# Patient Record
Sex: Female | Born: 1955 | Race: White | Hispanic: No | Marital: Married | Smoking: Never smoker
Health system: Southern US, Community
[De-identification: ages and names within clinical notes are randomized; demographics above are authoritative.]

## PROBLEM LIST (undated history)

## (undated) DIAGNOSIS — K5790 Diverticulosis of intestine, part unspecified, without perforation or abscess without bleeding: Secondary | ICD-10-CM

## (undated) DIAGNOSIS — M810 Age-related osteoporosis without current pathological fracture: Secondary | ICD-10-CM

## (undated) DIAGNOSIS — I341 Nonrheumatic mitral (valve) prolapse: Secondary | ICD-10-CM

## (undated) DIAGNOSIS — R7982 Elevated C-reactive protein (CRP): Secondary | ICD-10-CM

## (undated) DIAGNOSIS — K222 Esophageal obstruction: Secondary | ICD-10-CM

## (undated) DIAGNOSIS — K219 Gastro-esophageal reflux disease without esophagitis: Secondary | ICD-10-CM

## (undated) DIAGNOSIS — I10 Essential (primary) hypertension: Secondary | ICD-10-CM

## (undated) DIAGNOSIS — N200 Calculus of kidney: Secondary | ICD-10-CM

## (undated) DIAGNOSIS — K449 Diaphragmatic hernia without obstruction or gangrene: Secondary | ICD-10-CM

## (undated) HISTORY — DX: Essential (primary) hypertension: I10

## (undated) HISTORY — DX: Esophageal obstruction: K22.2

## (undated) HISTORY — DX: Diaphragmatic hernia without obstruction or gangrene: K44.9

## (undated) HISTORY — DX: Gastro-esophageal reflux disease without esophagitis: K21.9

## (undated) HISTORY — PX: OTHER SURGICAL HISTORY: SHX169

## (undated) HISTORY — DX: Calculus of kidney: N20.0

## (undated) HISTORY — DX: Diverticulosis of intestine, part unspecified, without perforation or abscess without bleeding: K57.90

## (undated) HISTORY — PX: FOOT SURGERY: SHX648

## (undated) HISTORY — DX: Age-related osteoporosis without current pathological fracture: M81.0

## (undated) HISTORY — DX: Nonrheumatic mitral (valve) prolapse: I34.1

## (undated) HISTORY — DX: Elevated C-reactive protein (CRP): R79.82

---

## 1997-11-27 ENCOUNTER — Emergency Department (HOSPITAL_COMMUNITY): Admission: EM | Admit: 1997-11-27 | Discharge: 1997-11-27 | Payer: Self-pay | Admitting: Emergency Medicine

## 1999-06-03 ENCOUNTER — Other Ambulatory Visit: Admission: RE | Admit: 1999-06-03 | Discharge: 1999-06-03 | Payer: Self-pay | Admitting: *Deleted

## 2000-06-02 ENCOUNTER — Other Ambulatory Visit: Admission: RE | Admit: 2000-06-02 | Discharge: 2000-06-02 | Payer: Self-pay | Admitting: *Deleted

## 2001-06-05 ENCOUNTER — Other Ambulatory Visit: Admission: RE | Admit: 2001-06-05 | Discharge: 2001-06-05 | Payer: Self-pay | Admitting: Obstetrics and Gynecology

## 2002-06-11 ENCOUNTER — Other Ambulatory Visit: Admission: RE | Admit: 2002-06-11 | Discharge: 2002-06-11 | Payer: Self-pay | Admitting: Obstetrics and Gynecology

## 2002-06-25 ENCOUNTER — Emergency Department (HOSPITAL_COMMUNITY): Admission: EM | Admit: 2002-06-25 | Discharge: 2002-06-25 | Payer: Self-pay | Admitting: Podiatry

## 2002-10-19 ENCOUNTER — Encounter: Payer: Self-pay | Admitting: Internal Medicine

## 2002-10-19 ENCOUNTER — Ambulatory Visit (HOSPITAL_COMMUNITY): Admission: RE | Admit: 2002-10-19 | Discharge: 2002-10-19 | Payer: Self-pay | Admitting: Internal Medicine

## 2003-01-31 ENCOUNTER — Encounter: Admission: RE | Admit: 2003-01-31 | Discharge: 2003-01-31 | Payer: Self-pay | Admitting: Obstetrics and Gynecology

## 2003-01-31 ENCOUNTER — Encounter: Payer: Self-pay | Admitting: Obstetrics and Gynecology

## 2003-07-31 ENCOUNTER — Other Ambulatory Visit: Admission: RE | Admit: 2003-07-31 | Discharge: 2003-07-31 | Payer: Self-pay | Admitting: Obstetrics and Gynecology

## 2004-02-14 ENCOUNTER — Encounter: Admission: RE | Admit: 2004-02-14 | Discharge: 2004-02-14 | Payer: Self-pay | Admitting: Obstetrics and Gynecology

## 2004-07-15 ENCOUNTER — Ambulatory Visit: Payer: Self-pay | Admitting: Cardiology

## 2004-07-24 ENCOUNTER — Ambulatory Visit: Payer: Self-pay | Admitting: Cardiology

## 2004-08-12 ENCOUNTER — Ambulatory Visit: Payer: Self-pay

## 2005-01-05 ENCOUNTER — Ambulatory Visit: Payer: Self-pay | Admitting: Internal Medicine

## 2005-04-06 ENCOUNTER — Encounter: Admission: RE | Admit: 2005-04-06 | Discharge: 2005-04-06 | Payer: Self-pay | Admitting: Obstetrics and Gynecology

## 2006-02-02 ENCOUNTER — Ambulatory Visit: Payer: Self-pay | Admitting: Internal Medicine

## 2006-04-11 ENCOUNTER — Ambulatory Visit: Payer: Self-pay | Admitting: Internal Medicine

## 2006-05-03 ENCOUNTER — Encounter: Admission: RE | Admit: 2006-05-03 | Discharge: 2006-05-03 | Payer: Self-pay | Admitting: Obstetrics and Gynecology

## 2006-05-18 ENCOUNTER — Encounter: Admission: RE | Admit: 2006-05-18 | Discharge: 2006-05-18 | Payer: Self-pay | Admitting: Obstetrics and Gynecology

## 2006-09-21 ENCOUNTER — Ambulatory Visit: Payer: Self-pay | Admitting: Cardiology

## 2006-09-22 ENCOUNTER — Encounter: Payer: Self-pay | Admitting: Internal Medicine

## 2006-09-22 ENCOUNTER — Ambulatory Visit: Payer: Self-pay

## 2006-11-17 ENCOUNTER — Encounter (INDEPENDENT_AMBULATORY_CARE_PROVIDER_SITE_OTHER): Payer: Self-pay | Admitting: Diagnostic Radiology

## 2006-11-17 ENCOUNTER — Encounter: Admission: RE | Admit: 2006-11-17 | Discharge: 2006-11-17 | Payer: Self-pay | Admitting: Obstetrics and Gynecology

## 2007-03-06 ENCOUNTER — Ambulatory Visit: Payer: Self-pay | Admitting: Internal Medicine

## 2007-05-17 ENCOUNTER — Encounter: Admission: RE | Admit: 2007-05-17 | Discharge: 2007-05-17 | Payer: Self-pay | Admitting: Obstetrics and Gynecology

## 2007-05-30 ENCOUNTER — Telehealth (INDEPENDENT_AMBULATORY_CARE_PROVIDER_SITE_OTHER): Payer: Self-pay | Admitting: *Deleted

## 2007-05-31 ENCOUNTER — Encounter: Payer: Self-pay | Admitting: Internal Medicine

## 2007-05-31 DIAGNOSIS — I341 Nonrheumatic mitral (valve) prolapse: Secondary | ICD-10-CM | POA: Insufficient documentation

## 2007-05-31 DIAGNOSIS — K219 Gastro-esophageal reflux disease without esophagitis: Secondary | ICD-10-CM | POA: Insufficient documentation

## 2007-05-31 DIAGNOSIS — Z87442 Personal history of urinary calculi: Secondary | ICD-10-CM | POA: Insufficient documentation

## 2007-05-31 DIAGNOSIS — K449 Diaphragmatic hernia without obstruction or gangrene: Secondary | ICD-10-CM | POA: Insufficient documentation

## 2007-05-31 DIAGNOSIS — I10 Essential (primary) hypertension: Secondary | ICD-10-CM | POA: Insufficient documentation

## 2007-05-31 DIAGNOSIS — J4530 Mild persistent asthma, uncomplicated: Secondary | ICD-10-CM | POA: Insufficient documentation

## 2007-06-27 ENCOUNTER — Telehealth: Payer: Self-pay | Admitting: Internal Medicine

## 2007-10-12 DIAGNOSIS — K222 Esophageal obstruction: Secondary | ICD-10-CM | POA: Insufficient documentation

## 2007-10-12 DIAGNOSIS — R011 Cardiac murmur, unspecified: Secondary | ICD-10-CM | POA: Insufficient documentation

## 2007-10-23 ENCOUNTER — Ambulatory Visit: Payer: Self-pay | Admitting: Internal Medicine

## 2007-11-07 ENCOUNTER — Encounter: Payer: Self-pay | Admitting: Internal Medicine

## 2007-11-09 ENCOUNTER — Encounter: Payer: Self-pay | Admitting: Internal Medicine

## 2007-11-27 ENCOUNTER — Ambulatory Visit: Payer: Self-pay | Admitting: Internal Medicine

## 2007-12-19 ENCOUNTER — Ambulatory Visit: Payer: Self-pay | Admitting: Internal Medicine

## 2008-01-02 ENCOUNTER — Telehealth: Payer: Self-pay | Admitting: Internal Medicine

## 2008-01-17 ENCOUNTER — Encounter: Payer: Self-pay | Admitting: Internal Medicine

## 2008-01-23 ENCOUNTER — Encounter: Admission: RE | Admit: 2008-01-23 | Discharge: 2008-01-23 | Payer: Self-pay | Admitting: Otolaryngology

## 2008-01-23 ENCOUNTER — Encounter: Payer: Self-pay | Admitting: Internal Medicine

## 2008-02-02 ENCOUNTER — Telehealth: Payer: Self-pay | Admitting: Internal Medicine

## 2008-02-15 ENCOUNTER — Encounter: Payer: Self-pay | Admitting: Internal Medicine

## 2008-05-21 ENCOUNTER — Encounter: Admission: RE | Admit: 2008-05-21 | Discharge: 2008-05-21 | Payer: Self-pay | Admitting: Obstetrics and Gynecology

## 2008-08-05 ENCOUNTER — Ambulatory Visit: Payer: Self-pay | Admitting: Internal Medicine

## 2008-08-05 ENCOUNTER — Encounter: Payer: Self-pay | Admitting: Adult Health

## 2008-08-05 DIAGNOSIS — M949 Disorder of cartilage, unspecified: Secondary | ICD-10-CM

## 2008-08-05 DIAGNOSIS — M899 Disorder of bone, unspecified: Secondary | ICD-10-CM | POA: Insufficient documentation

## 2008-10-02 ENCOUNTER — Ambulatory Visit: Payer: Self-pay | Admitting: Internal Medicine

## 2008-10-02 ENCOUNTER — Encounter: Payer: Self-pay | Admitting: Internal Medicine

## 2008-10-02 DIAGNOSIS — I498 Other specified cardiac arrhythmias: Secondary | ICD-10-CM | POA: Insufficient documentation

## 2008-10-21 ENCOUNTER — Encounter: Payer: Self-pay | Admitting: Internal Medicine

## 2008-10-21 ENCOUNTER — Ambulatory Visit: Payer: Self-pay

## 2008-10-21 ENCOUNTER — Ambulatory Visit: Payer: Self-pay | Admitting: Internal Medicine

## 2008-10-24 ENCOUNTER — Ambulatory Visit: Payer: Self-pay | Admitting: Internal Medicine

## 2008-10-24 DIAGNOSIS — E7800 Pure hypercholesterolemia, unspecified: Secondary | ICD-10-CM | POA: Insufficient documentation

## 2008-10-24 LAB — CONVERTED CEMR LAB
ALT: 14 units/L (ref 0–35)
AST: 19 units/L (ref 0–37)
Albumin: 3.6 g/dL (ref 3.5–5.2)
Alkaline Phosphatase: 54 units/L (ref 39–117)
BUN: 18 mg/dL (ref 6–23)
Bilirubin, Direct: 0.1 mg/dL (ref 0.0–0.3)
CO2: 31 meq/L (ref 19–32)
Calcium: 9 mg/dL (ref 8.4–10.5)
Chloride: 107 meq/L (ref 96–112)
Cholesterol: 200 mg/dL (ref 0–200)
Creatinine, Ser: 0.8 mg/dL (ref 0.4–1.2)
GFR calc non Af Amer: 80.3 mL/min (ref >60)
Glucose, Bld: 79 mg/dL (ref 70–99)
HDL: 75 mg/dL (ref >39.00)
LDL Cholesterol: 117 mg/dL — ABNORMAL HIGH (ref 0–99)
Potassium: 3.8 meq/L (ref 3.5–5.1)
Sodium: 142 meq/L (ref 135–145)
Total Bilirubin: 0.5 mg/dL (ref 0.3–1.2)
Total CHOL/HDL Ratio: 3
Total Protein: 6.4 g/dL (ref 6.0–8.3)
Triglycerides: 38 mg/dL (ref 0.0–149.0)
VLDL: 7.6 mg/dL (ref 0.0–40.0)

## 2008-10-28 LAB — CONVERTED CEMR LAB: CRP, High Sensitivity: 3 (ref 0.00–5.00)

## 2008-11-14 ENCOUNTER — Ambulatory Visit: Payer: Self-pay | Admitting: Internal Medicine

## 2008-12-02 IMAGING — US US SOFT TISSUE HEAD/NECK
1 series · 14 of 25 positions shown · non-contrast
Comparison: None

CLINICAL DATA: Elevated TSH.  Question enlarged thyroid on physical
exam.

THYROID ULTRASOUND
TECHNIQUE: Ultrasound examination of the thyroid gland and
adjacent soft tissues was performed.

[Series 1: us soft tissue head/neck · 0.04mm/px · 14 of 32 slices shown]
[im 1/32]
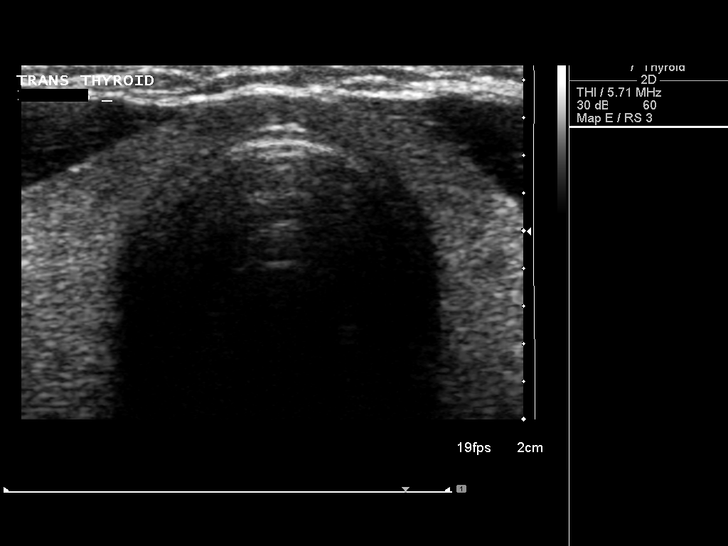
[im 3/32]
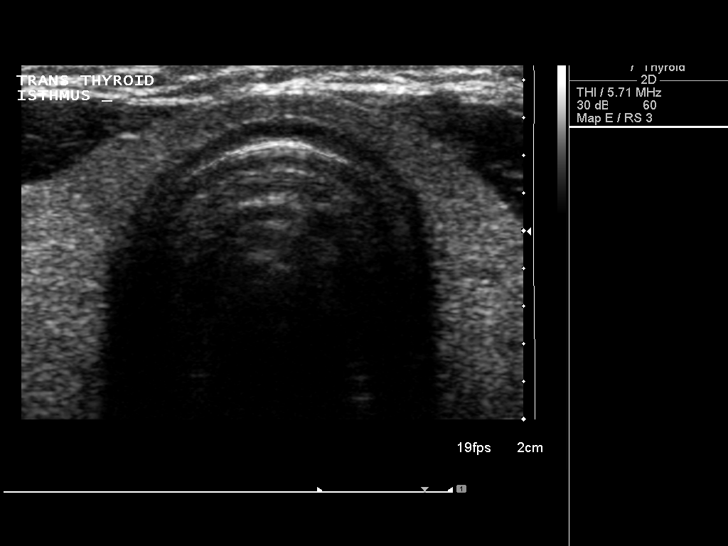
[im 6/32]
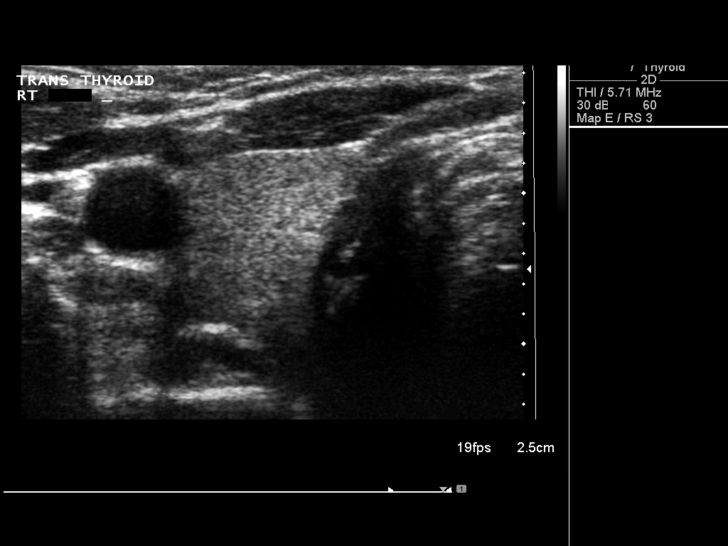
[im 8/32]
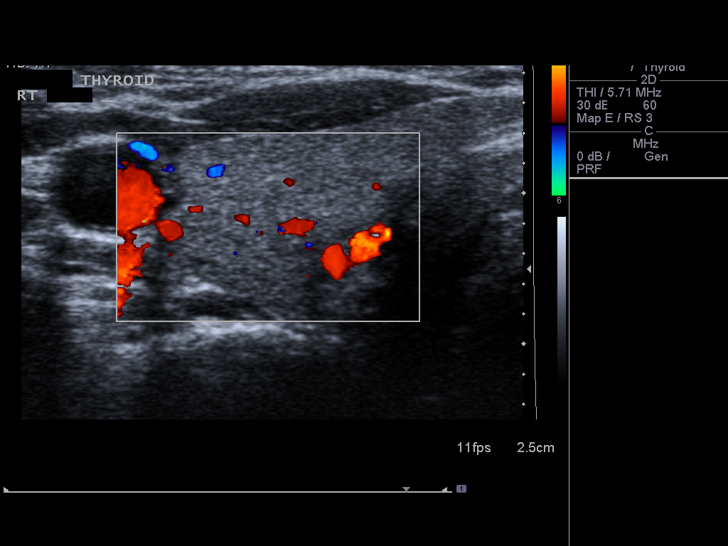
[im 11/32]
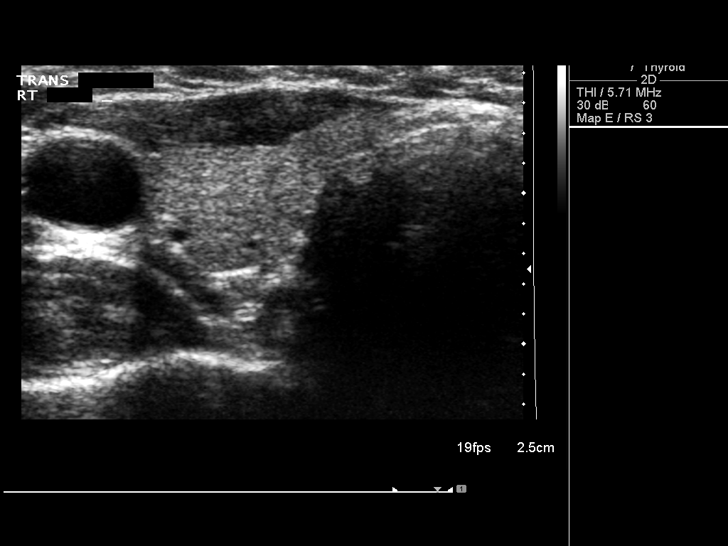
[im 12/32]
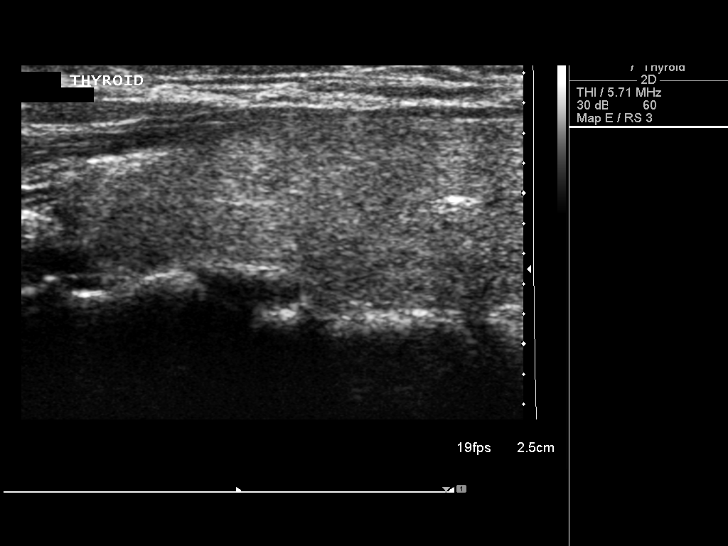
[im 15/32]
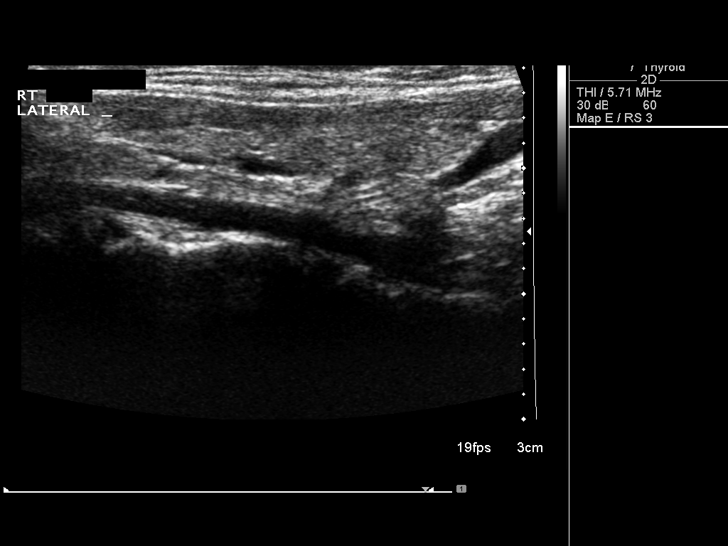
[im 17/32]
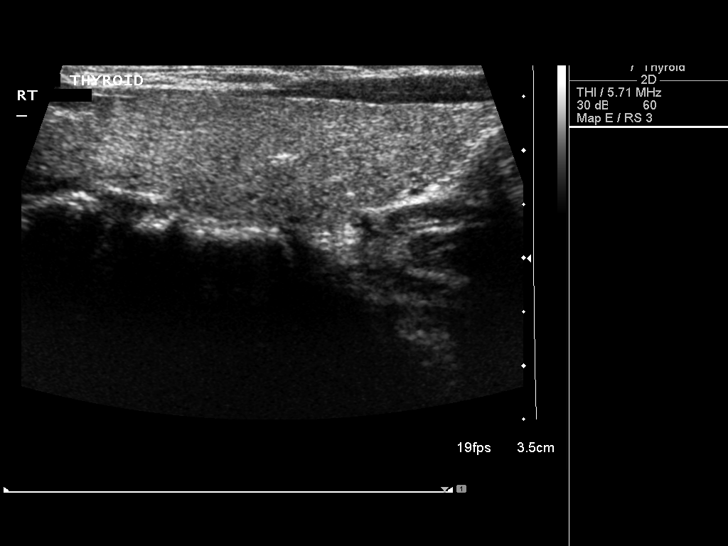
[im 20/32]
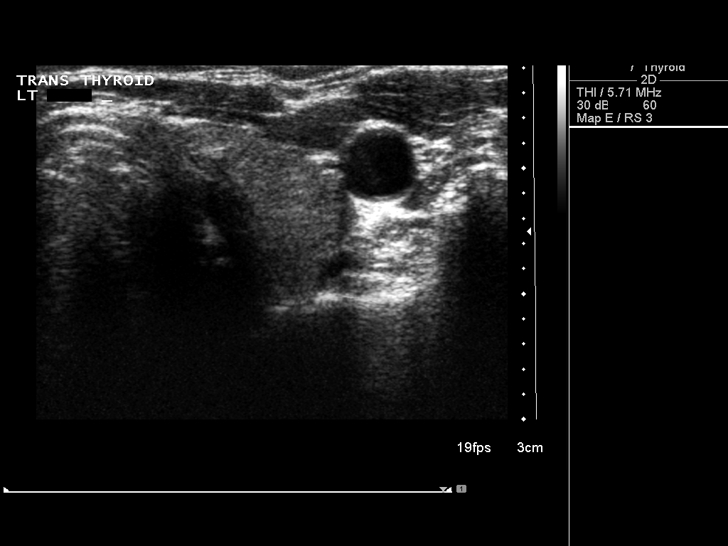
[im 21/32]
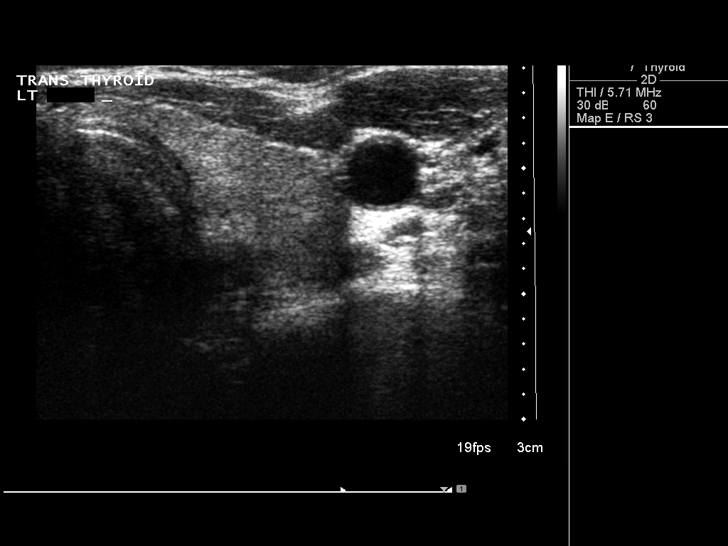
[im 24/32]
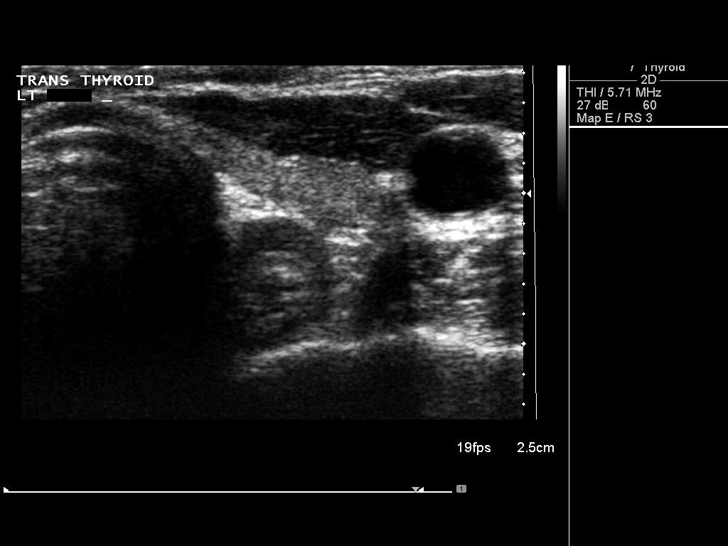
[im 26/32]
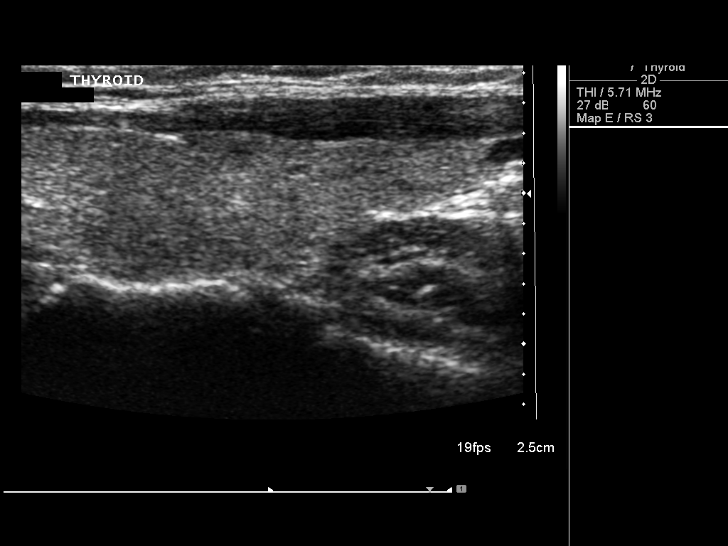
[im 29/32]
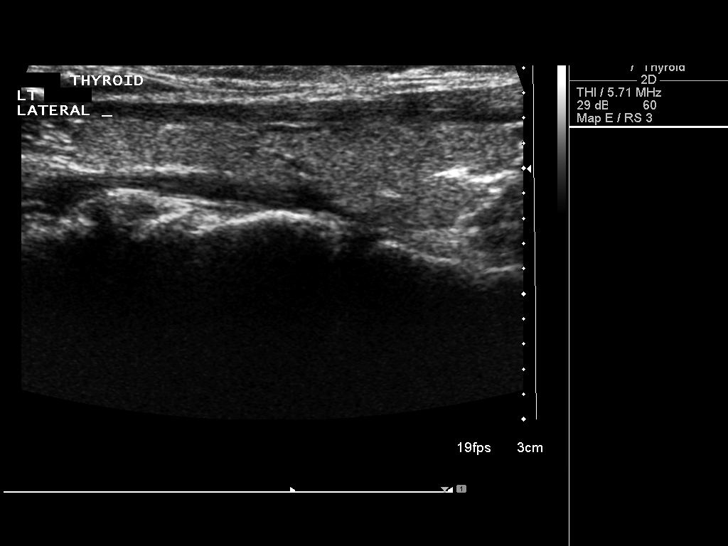
[im 32/32]
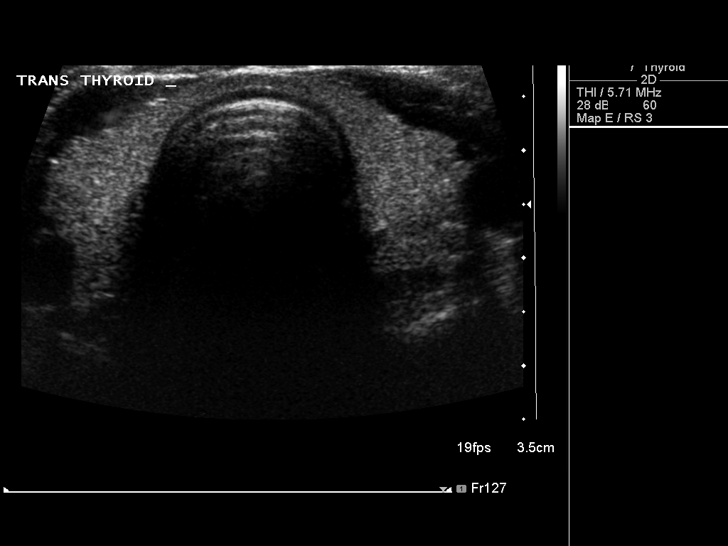

[14 of 25 positions shown; findings below may reference images not displayed]

FINDINGS: Thyroid gland is normal in size with the right lobe
measuring 4.1 cm long X 1.4 cm AP X 1.5 cm wide.  Left lobe
measures 3.7 cm long X 1.0 cm AP X 1.3 cm wide.  Isthmus measures
normally at 2 mm AP.  Thyroid echotexture is diffusely homogeneous.
No focal thyroid lesion noted.
IMPRESSION: Negative.

## 2009-01-20 ENCOUNTER — Ambulatory Visit: Payer: Self-pay | Admitting: Internal Medicine

## 2009-01-31 ENCOUNTER — Telehealth: Payer: Self-pay | Admitting: Internal Medicine

## 2009-03-03 ENCOUNTER — Ambulatory Visit: Payer: Self-pay | Admitting: Internal Medicine

## 2009-03-04 ENCOUNTER — Encounter: Payer: Self-pay | Admitting: Internal Medicine

## 2009-03-11 ENCOUNTER — Telehealth: Payer: Self-pay | Admitting: Internal Medicine

## 2009-03-20 ENCOUNTER — Encounter (INDEPENDENT_AMBULATORY_CARE_PROVIDER_SITE_OTHER): Payer: Self-pay | Admitting: *Deleted

## 2009-03-24 ENCOUNTER — Encounter: Payer: Self-pay | Admitting: Internal Medicine

## 2009-04-14 ENCOUNTER — Telehealth: Payer: Self-pay | Admitting: Internal Medicine

## 2009-05-29 ENCOUNTER — Encounter: Admission: RE | Admit: 2009-05-29 | Discharge: 2009-05-29 | Payer: Self-pay | Admitting: Obstetrics and Gynecology

## 2009-07-04 ENCOUNTER — Encounter: Payer: Self-pay | Admitting: Internal Medicine

## 2009-07-17 ENCOUNTER — Encounter: Admission: RE | Admit: 2009-07-17 | Discharge: 2009-07-17 | Payer: Self-pay | Admitting: Orthopedic Surgery

## 2009-07-22 ENCOUNTER — Telehealth (INDEPENDENT_AMBULATORY_CARE_PROVIDER_SITE_OTHER): Payer: Self-pay | Admitting: *Deleted

## 2009-12-25 ENCOUNTER — Telehealth: Payer: Self-pay | Admitting: Internal Medicine

## 2010-02-10 ENCOUNTER — Ambulatory Visit: Payer: Self-pay | Admitting: Internal Medicine

## 2010-02-18 ENCOUNTER — Ambulatory Visit: Payer: Self-pay | Admitting: Internal Medicine

## 2010-02-18 LAB — CONVERTED CEMR LAB
ALT: 15 units/L (ref 0–35)
AST: 20 units/L (ref 0–37)
Albumin: 4.1 g/dL (ref 3.5–5.2)
Alkaline Phosphatase: 55 units/L (ref 39–117)
BUN: 18 mg/dL (ref 6–23)
Bilirubin, Direct: 0.1 mg/dL (ref 0.0–0.3)
CO2: 27 meq/L (ref 19–32)
Calcium: 9.9 mg/dL (ref 8.4–10.5)
Chloride: 105 meq/L (ref 96–112)
Cholesterol: 213 mg/dL — ABNORMAL HIGH (ref 0–200)
Creatinine, Ser: 0.9 mg/dL (ref 0.4–1.2)
Direct LDL: 120.7 mg/dL
GFR calc non Af Amer: 71.57 mL/min (ref >60)
Glucose, Bld: 87 mg/dL (ref 70–99)
HDL: 85 mg/dL (ref >39.00)
Potassium: 5.1 meq/L (ref 3.5–5.1)
Sodium: 141 meq/L (ref 135–145)
Total Bilirubin: 0.6 mg/dL (ref 0.3–1.2)
Total CHOL/HDL Ratio: 3
Total Protein: 6.4 g/dL (ref 6.0–8.3)
Triglycerides: 43 mg/dL (ref 0.0–149.0)
VLDL: 8.6 mg/dL (ref 0.0–40.0)

## 2010-06-18 ENCOUNTER — Encounter
Admission: RE | Admit: 2010-06-18 | Discharge: 2010-06-18 | Payer: Self-pay | Source: Home / Self Care | Attending: Obstetrics and Gynecology | Admitting: Obstetrics and Gynecology

## 2010-07-05 ENCOUNTER — Encounter: Payer: Self-pay | Admitting: Obstetrics and Gynecology

## 2010-07-16 NOTE — Progress Notes (Signed)
Summary: calling regarding labs   Phone Note Call from Patient Call back at [phone]   Caller: Patient Summary of Call: Pt calling regarding having labs drawn. Initial call taken by: Judie Grieve,  December 25, 2009 9:16 AM  Follow-up for Phone Call        spoke w/pt labs sch for 8/30 Meredith Staggers, RN  December 25, 2009 10:48 AM

## 2010-07-16 NOTE — Progress Notes (Signed)
Summary: speak to nurse re PPI   Phone Note Call from Patient Call back at [phone]   Caller: Patient Call For: Marina Goodell Reason for Call: Talk to Nurse Summary of Call: Patient wants to know if she can get off a medication Dr Marina Goodell has her on. (Protonix) Initial call taken by: Tawni Levy,  December 25, 2009 9:11 AM  Follow-up for Phone Call        Pt.had fx. elbow after a fall in Feb and recent stress in leg.Dr.Whitfield is concerned about her bone integrity. Also takes steroid inhalers for asthma.Asking your opinion about disc. Protonix and just follow with endoscopy. Follow-up by: Teryl Lucy RN,  December 25, 2009 10:16 AM  Additional Follow-up for Phone Call Additional follow up Details #1::        Sorry to hear about the elbow. Hope that she's doing better.She will need to be on something for GERD and hx of esophageal stricture. Given her concerns over PPI's (we discussed this previously in the office), we can try H2RA's. Let's start with Ranitidine 150mg  two times a day. If that does not control reflux symptoms, we could increase to 300mg  two times a day. I'd be happy to call her if she would like to discuss further. Additional Follow-up by: Hilarie Fredrickson MD,  December 25, 2009 11:12 AM    Additional Follow-up for Phone Call Additional follow up Details #2::    message left to call back.  Teryl Lucy RN  December 25, 2009 11:29 AM Pt. delighted to know she can start Ranitidine.She will call back if it does not work.  Follow-up by: Teryl Lucy RN,  December 25, 2009 3:10 PM

## 2010-07-16 NOTE — Assessment & Plan Note (Signed)
Summary: f1y   Visit Type:  Follow-up Referring Provider:  n/a Primary Provider:  Louanna Raw, MD   CC:  no complaints.  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 She returns for f/u.   She remains very active without any symtpoms. Exercising 5-6 days per week. Echo 2010  showed normal LV function with MVP and mild MR. No palpitations or dyspnea. recently brok elbow so was off her exercise regimen for a while. gained about 10 pounds. Dieting with Atkins and now Weight Watchers.  Recent lipids TC 213 HDL 85 LDL 121. (previous TC 200/TG 38/HDL 75/LDL 117, HS-CRP was elevated at 3.00.)     Current Medications (verified): 1)  Prometrium 100 Mg Caps (Progesterone Micronized) .... Take 1 Capsule By Mouth Once A Day 2)  Ventolin Hfa 108 (90 Base) Mcg/act Aers (Albuterol Sulfate) .... 2 Puffs Every 4-6 Hr As Needed 3)  Evan Mist .... 2 Sprays Daily 4)  Fish Oil   Oil (Fish Oil) .[name].. 1 Once Daily 5)  Qvar 80 Mcg/act  Aers (Beclomethasone Dipropionate) .... Two  Puffs Twice Daily 6)  Zantac 150 Maximum Strength 150 Mg Tabs (Ranitidine Hcl) .... One Tablet By Mouth At Bedtime  Allergies: No Known Drug Allergies  Family History: Family History of Breast Cancer: Family History of Heart Disease:  No family history of gastrointestinal malignancy  Maternal uncles died from sudden cardiac death. Women on that side have lived into 53s Father died MI at 52. Had h/o IPF. + tobacco  Review of Systems       As per HPI and past medical history; otherwise all systems negative.   Vital Signs:  Patient profile:   55 year old female Height:      65 inches Weight:      135 pounds BMI:     22.55 Pulse rate:   96 / minute Pulse rhythm:   regular BP sitting:   126 / 88  (right arm)  Vitals Entered By: Jacquelin Hawking, CMA (February 18, 2010 12:32 PM)  Physical Exam  General:  Thin. well appearing. no resp  difficulty HEENT: normal Neck: supple. no JVD. Carotids 2+ bilat; not bruits. No lymphadenopathy or thryomegaly appreciated. Cor: PMI nondisplaced. Regular rate & rhythm. No rubs, gallops, murmur. Lungs: clear Abdomen: soft, nontender, nondistended.  Extremities: no cyanosis, clubbing, rash,  edema Neuro: alert & orientedx3, cranial nerves grossly intact. moves all 4 extremities w/o difficulty. affect pleasant    Impression & Recommendations:  Problem # 1:  MITRAL VALVE PROLAPSE (ICD-424.0) Stable. No murmur on exam today.   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 HDL very high. Continue to monitor. Recheck in 6 months with CRP. At some point will need exercise test for routine survellance as she gets further into menopause.  Problem # 3:  HYPERTENSION (ICD-401.9) BP well controlled at gym and [employer]. Suspect she has component of white-coat hypertension. Continue to follow.   Other Orders: EKG w/ Interpretation (93000)  Patient Instructions: 1)  Your physician recommends that you return for a FASTING lipid, liver, bmet, and crp in 6 months.  We will send you a reminder letter in the mail, when you get this call our office and schedule an appointment for labs. 2)  Your physician wants you to follow-up in:  1 year.  You will receive a reminder letter in the mail two months  in advance. If you don't receive a letter, please call our office to schedule the follow-up appointment.

## 2010-07-16 NOTE — Letter (Signed)
Summary: [facility]  [facility]   Imported By: Sherian Rein 07/15/2009 08:40:07  _____________________________________________________________________  External Attachment:    Type:   Image     Comment:   External Document

## 2010-07-16 NOTE — Progress Notes (Signed)
   Phone Note From Other Clinic   Caller: Louanne Details for Reason: Pt.Information Initial call taken by: Marijean Niemann Echo,LOV over to [facility] to fax [phone] [facility]  July 22, 2009 9:30 AM  Appended Document:  Terri faxed LOV and EKG. No PFT to send. Faxed to [phone] Attn: Frazier Butt

## 2010-08-20 ENCOUNTER — Encounter: Payer: Self-pay | Admitting: Internal Medicine

## 2010-08-25 NOTE — Letter (Signed)
Summary: Lipid reminder  [facility], Main Office  [address]   [place], [place] [address]   Phone: [phone]  Fax: [phone]        August 20, 2010 MRN: [id number]    [facility] [address] [place], [place]  [address]    Dear Ms. Arquette,  Our records indicate it is time to check your cholesterol.  Please call our office to schedule an appt for labwork.  Please remember it is a fasting lab.     Sincerely,  Meredith Staggers, RN Arvilla Meres, MD  This letter has been electronically signed by your physician.

## 2010-10-15 ENCOUNTER — Other Ambulatory Visit (INDEPENDENT_AMBULATORY_CARE_PROVIDER_SITE_OTHER): Payer: 59 | Admitting: *Deleted

## 2010-10-15 DIAGNOSIS — I059 Rheumatic mitral valve disease, unspecified: Secondary | ICD-10-CM

## 2010-10-15 LAB — LDL CHOLESTEROL, DIRECT: Direct LDL: 133.1 mg/dL

## 2010-10-15 LAB — BASIC METABOLIC PANEL
CO2: 27 mEq/L (ref 19–32)
Chloride: 101 mEq/L (ref 96–112)
Glucose, Bld: 87 mg/dL (ref 70–99)
Potassium: 4.2 mEq/L (ref 3.5–5.1)
Sodium: 137 mEq/L (ref 135–145)

## 2010-10-15 LAB — HIGH SENSITIVITY CRP: CRP, High Sensitivity: 0.68 mg/L (ref 0.00–5.00)

## 2010-10-15 LAB — HEPATIC FUNCTION PANEL
AST: 18 U/L (ref 0–37)
Albumin: 3.8 g/dL (ref 3.5–5.2)
Alkaline Phosphatase: 52 U/L (ref 39–117)
Total Protein: 6.6 g/dL (ref 6.0–8.3)

## 2010-10-19 ENCOUNTER — Other Ambulatory Visit: Payer: Self-pay | Admitting: Adult Health

## 2010-10-21 ENCOUNTER — Telehealth: Payer: Self-pay | Admitting: Internal Medicine

## 2010-10-21 ENCOUNTER — Other Ambulatory Visit: Payer: Self-pay | Admitting: Internal Medicine

## 2010-10-21 NOTE — Telephone Encounter (Signed)
Pt rtn call to get test results and when you call have them to interrupt her where ever she is

## 2010-10-21 NOTE — Telephone Encounter (Signed)
Pt given lab results, copy mailed to her per her request

## 2010-10-23 NOTE — Telephone Encounter (Signed)
Pt not seen in office since 8/10 > overdue for appt.

## 2010-10-27 NOTE — Assessment & Plan Note (Signed)
[facility]                             PULMONARY OFFICE NOTE   NAME:[name], [name]                        MRN:          [id number]  DATE:03/06/2007                            DOB:          Feb 22, 1956    A delightful 55 year old white female with a history of severe asthma  resulting in status asthmaticus along with documented hiatal hernia with  GERD by previous endoscopy in 2004.  She had previously been maintained  on Pulmicort but ran out of it several weeks ago and found worsening  symptoms of chest tightness especially when she would lie down at night  and only partial improvement with Ventolin.  She came in today without  actually using her morning Ventolin, feeling a little tight but denies  any pleuritic or exertional chest pain, orthopnea, PND or leg swelling,  overt sinus or reflux symptoms.   MEDICATIONS:  1. Protonix 40 mg daily.  2. Ventolin inhaler but not sure it has anything left in it.   The other problems she has had recently is increase in elbow tendinitis  for which she began using Aleve and noticed that with the Aleve she  began having more respiratory problems.  The patient denies any previous  history of nonsteroidal or aspirin-induced asthma.   PHYSICAL EXAMINATION:  GENERAL:  She is a pleasant, ambulatory white  female who appears exceptionally healthy.  VITAL SIGNS:  Stable.  HEENT:  Minimum turbinate edema, oropharynx clear.  NECK:  Supple without cervical adenopathy or tenderness. Trachea  midline.  No thyromegaly.  LUNGS:  Lung fields reveal trace wheeze only.  HEART:  Regular rate and rhythm without murmurs, rubs, or gallops.  ABDOMEN:  Soft, benign.  EXTREMITIES:  Warm without calf tenderness, cyanosis, clubbing, or  edema.   IMPRESSION:  Flare-up of asthma off of Pulmicort in the setting of  previous exquisitely sensitive asthma to inhaled steroids.  The concern  I have with this patient is the same as many  asthmatics in that she  should not have run out of Pulmicort, but even after she did she did not  notice the immediate repercussions.   I therefore think it would be better if she is going to titrate the  medications up and down, the better choice for her would be to use  Symbicort 160/4.5 two puffs b.i.d. now to get her condition back under  control and then use the Symbicort 80/4.5 one to two puffs b.i.d. (not  approved in [place] for this, but very commonly used in [place] as a hybrid  between a maintenance and p.r.n.).   I did also advise her that she needs to remember she does have asthma  and to keep the albuterol handy.  For this reason, Ventolin HFA would be  a better product since it has a counter on it and I strongly recommend  it.   I also recommended prednisone 10 mg tablets for the next six days.   She appears to have very mild tendinitis involving the right elbow for  which hopefully prednisone will help.  She identified Angela Arista  Kathi [name], M.D. for follow-up, however having seen him before for  orthopedic problems and I will send him a copy of this note as well.     Angela Dalton. Sherene Sires, MD, [facility]  Electronically Signed    MBW/MedQ  DD: 03/06/2007  DT: 03/07/2007  Job #: [id number]   cc:   Angela Manges. Cleophas [name], M.D.

## 2010-10-30 NOTE — Assessment & Plan Note (Signed)
[facility]                           GASTROENTEROLOGY OFFICE NOTE   NAME:[name]                        MRN:          [id number]  DATE:02/02/2006                            DOB:          1956-03-14    HISTORY OF PRESENT ILLNESS:  Angela [name] presents today for follow-up.  She is a  55 year old with gastroesophageal reflux disease complicated by erosive  esophagitis and peptic stricture formation.  She underwent upper endoscopy  with esophageal dilation in May of 2004.  Since that time she has been  maintained on Nexium 40 mg daily.  Overall she has excellent control of  reflux symptoms.  Occasional breakthrough once or twice per year.  She has  had no recurrent dysphagia.  No appreciable medication side effects.  No  other complaints.  She does request a medication refill.   CURRENT MEDICATIONS:  1. Nexium 40 mg daily.  2. Pulmicort b.i.d.  3. She also uses Ventolin p.r.n.   PHYSICAL EXAMINATION:  GENERAL:  Well-appearing female in no acute distress.  VITAL SIGNS:  Blood pressure 128/78, heart rate is 86 and regular, weight is  132.2 pounds  HEENT:  Sclerae anicteric.  LUNGS:  Clear.  HEART:  Regular.  ABDOMEN:  Soft without tenderness, mass, or hernia.   IMPRESSION:  Gastroesophageal reflux disease complicated by peptic  stricture.  The patient remains asymptomatic post dilation on Nexium.   RECOMMENDATIONS:  1. Continue Nexium 40 mg p.o. daily.  Prescription with multiple refills      has been provided.  2. Office follow-up in one year unless interval questions or problems.                                   Wilhemina Bonito. Eda Keys., MD   JNP/MedQ  DD:  02/02/2006  DT:  02/03/2006  Job #:  [id number]

## 2010-10-30 NOTE — Assessment & Plan Note (Signed)
[facility]                               PULMONARY OFFICE NOTE   NAME:Billing, Onalee Hua                        MRN:          [id number]  DATE:04/11/2006                            DOB:          1956/03/31    A 55 year old white female in for a yearly evaluation of chronic asthma for  which she has found that when she misses doses of Pulmicort, she begins to  have increasing symptoms but rarely, if ever, needs albuterol.  She denies  any nocturnal wheeze, cough, and is able to tolerate normal activities  without any difficulty.   She comes in today also acutely ill for the last 2 weeks with a persistent  sensation of scratchy throat after what she had felt was the flu with low-  grade fever but never purulent sputum, chest pain, or increased dyspnea.   Past medical history is significant for the fact that this patient has  documented GERD with stricture by upper endoscopy, Oct 19, 2002.   ALLERGIES:  None known.   MEDICATIONS:  1. Nexium 40 mg daily.  2. Pulmicort 2 puffs b.i.d.  3. Ventolin p.r.n.   SOCIAL HISTORY:  She has never smoked.   FAMILY HISTORY:  Positive for asthma in a maternal grandmother only.  Positive for pulmonary fibrosis in her father.   REVIEW OF SYSTEMS:  Taken in detail and negative except as outlined above.   PHYSICAL EXAMINATION:  GENERAL:  This is a very healthy-appearing ambulatory  white female in no acute distress.  VITAL SIGNS:  Stable.  HEENT:  Minimal cobblestoning/erythema.  However, throat clearing is  frequent during interview and exam.  Nasal turbinates reveal minimal edema,  erythema, with no pallor, polyps, or cyanosis.  Ear canals clear  bilaterally.  NECK:  Supple without supraclavicular adenopathy.  __________  LUNGS:  Lung fields are perfectly clear bilaterally to auscultation and  percussion with no generalized localized wheezing or rhonchi.  HEART:  Regular rate and rhythm without murmur, gallop,  or rub.  No increase  in P2.  ABDOMEN:  Soft, benign.  EXTREMITIES:  Warm without calf tenderness, cyanosis, clubbing, or edema.   O2 saturations 97% on room air.   IMPRESSION:  1. All goals of asthma met with Pulmicort 2 puffs b.i.d.  She has tried on      her own and established a floor dose of 2 puffs b.i.d.  Below this,      she begins to have breakthrough wheezing, albeit not enough to even      require p.r.n. albuterol.  2. Acute upper respiratory infection with persistent sensation of      scratchy throat with no evidence of purulent sputum or active      sinusitis.  In this patient, who has documented reflux, most likely she      is having worsening reflux related to throat clearing and also has lost      the epithelial barrier to reflux.  I have recommended nonmenthol-      containing lozenges and doubling the dose of Nexium along with using  p.r.n. nonmenthol-containing lozenges.  Follow up will be yearly,      sooner p.r.n.    ______________________________  Charlaine Dalton. Sherene Sires, MD, [facility]    MBW/MedQ  DD: 04/11/2006  DT: 04/12/2006  Job #: [id number]

## 2010-10-30 NOTE — Assessment & Plan Note (Signed)
[facility]                            CARDIOLOGY OFFICE NOTE   Angela [name], Angela [name]                        MRN:          [id number]  DATE:09/21/2006                            DOB:          1956/05/27    PRIMARY CARE PHYSICIAN:  Angela Hughs, MD.   HISTORY OF PRESENT ILLNESS:  Angela [name] is a 55 year old woman with mild  mitral regurgitation in the setting of mitral valve prolapse.  She has  also had palpitations in the past which have clinically seemed to be due  to supraventricular tachycardia.   In the 2 years since I saw her she has had no recurrence of the  palpitations.  She continues to exercise very regularly with an  excellent workload.  She is not having any unusual dyspnea or chest  discomfort.  She has had no syncope, presyncope, PND, orthopnea, edema,  or chest discomfort.   CURRENT MEDICATIONS:  1. Pulmicort twice daily.  2. Protonix 40 mg daily.   EXAMINATION:  GENERAL:  Well-appearing in no distress with a heart rate  of 87, blood pressure 140/80, and weight of 137 pounds.  She has no jugular venous distention, thyromegaly, or lymphadenopathy.  LUNGS:  Clear to auscultation.  She has a nondisplaced point of maximal cardiac impulse.  There is a  regular rate and rhythm without murmur, rub, or gallop.  There is an  early systolic click.  ABDOMEN:  Soft, nondistended, nontender.  There is no  hepatosplenomegaly.  Bowel sounds are normal.  EXTREMITIES: Warm without clubbing, cyanosis, edema, or ulceration.  Carotid pulses are 2+ bilaterally without bruit.   Electrocardiogram demonstrates normal sinus rhythm with borderline  criteria for left ventricular hypertrophy.   IMPRESSION/RECOMMENDATIONS:  1. Mitral valve prolapse, mild mitral regurgitation:  She is      asymptomatic.  Will check echocardiogram to exclude any change.  We      discussed the new endocarditis prophylaxis guidelines.  Since she      has never had an allergic  reaction to her prophylaxis in the past      we      will continue with prophylaxis despite the change in guidelines.  2. History of supraventricular tachycardia:  No recurrence.     Angela Farber, MD  Electronically Signed    WED/MedQ  DD: 09/22/2006  DT: 09/22/2006  Job #: [id number]   cc:   Angela Dalton. Sherene Sires, MD, FCCP

## 2011-02-28 ENCOUNTER — Other Ambulatory Visit: Payer: Self-pay | Admitting: Internal Medicine

## 2011-04-09 ENCOUNTER — Encounter: Payer: Self-pay | Admitting: Internal Medicine

## 2011-04-12 ENCOUNTER — Encounter: Payer: Self-pay | Admitting: Internal Medicine

## 2011-04-12 ENCOUNTER — Ambulatory Visit (INDEPENDENT_AMBULATORY_CARE_PROVIDER_SITE_OTHER): Payer: 59 | Admitting: Internal Medicine

## 2011-04-12 DIAGNOSIS — J45909 Unspecified asthma, uncomplicated: Secondary | ICD-10-CM

## 2011-04-12 MED ORDER — BECLOMETHASONE DIPROPIONATE 80 MCG/ACT IN AERS: 2.0000 | INHALATION_SPRAY | Freq: Two times a day (BID) | RESPIRATORY_TRACT | Status: DC

## 2011-04-12 MED ORDER — ALBUTEROL SULFATE HFA 108 (90 BASE) MCG/ACT IN AERS: 2.0000 | INHALATION_SPRAY | RESPIRATORY_TRACT | Status: DC | PRN

## 2011-04-12 NOTE — Progress Notes (Signed)
Subjective:     Patient ID: Angela [name], female   DOB: [birth year], 55 y.o.   MRN: [id number]  HPI  68 yowf never smoker history of chronic asthma as child, then recurred age 64 with one episode life threatening exacerbation and admit to ICU [facility]     04/12/2011 f/u ov/Wert cc worse breathing x one month p injured back exercising. Only using qvar 80 2 qam and needing saba more than 2x weekly but not with exercise, no real pattern to flares, no cough or sinus issues or overt hb  Also concerned about bone density with dexa positive Jan 2012 for osteoporosis and now on Zantac   Sleeping ok without nocturnal  or early am exacerbation  of respiratory  c/o's or need for noct saba. Also denies any obvious fluctuation of symptoms with weather or environmental changes or other aggravating or alleviating factors except as outlined above       Past Medical History:  ASTHMA--chronic mild ...........................................................................Marland KitchenWert  GERD (ICD-530.81)--prev. endo 5/04--GERD/esophageal stricture, on Protonix  HIATAL HERNIA (ICD-553.3)  MITRAL VALVE PROLAPSE (ICD-424.0)  a. echo 4/08: EF 60%. minimal prolapse of posterior leaflet. trivial MR  b. ECH0 4/10: EF 55-60%. mild to moderate MVP with mild MR  Palpitations thought due to SVT  RENAL CALCULUS, HX OF (ICD-V13.01)  HYPERTENSION (ICD-401.9)    Review of Systems     Objective:   Physical Exam Very healthy appearing wf   Wt 135 04/12/2011  HEENT: nl dentition, turbinates, and orophanx. Nl external ear canals without cough reflex   NECK :  without JVD/Nodes/TM/ nl carotid upstrokes bilaterally   LUNGS: no acc muscle use, clear to A and P bilaterally without cough on insp or exp maneuvers   CV:  RRR  no s3 or murmur or increase in P2, no edema   ABD:  soft and nontender with nl excursion in the supine position. No bruits or organomegaly, bowel sounds nl  MS:  warm without deformities, calf  tenderness, cyanosis or clubbing  SKIN: warm and dry without lesions    NEURO:  alert, approp, no deficits      Assessment:         Plan:

## 2011-04-12 NOTE — Patient Instructions (Addendum)
Prolia would be an alternative bone builder for you (injection twice yearly) Evista is another option (pill)  Work on inhaler technique:  relax and gently blow all the way out then take a nice smooth deep breath back in, triggering the inhaler at same time you start breathing in.  Hold for up to 5 seconds if you can.  Rinse and gargle with water when done   If your mouth or throat starts to bother you,   I suggest you time the inhaler to your dental care and after using the inhaler(s) brush teeth and tongue with a baking soda containing toothpaste and when you rinse this out, gargle with it first to see if this helps your mouth and throat.    Ok to use qvar 80 2 puffs every am with option for the second if you need because of symptoms or need for rescue.  Please schedule a follow up visit in 12 months but call sooner if needed

## 2011-04-12 NOTE — Assessment & Plan Note (Signed)
I had an extended discussion with the patient today lasting 15 to 20 minutes of a 25 minute visit on the following issues:   The proper method of use, as well as anticipated side effects, of this metered-dose inhaler are discussed and demonstrated to the patient. Improved to 75% with coaching  Not at goal so rec max qvar 80 2bid and then consider adding singulair.  Concerned about gerd and osteo and interactions of meds, also reviewed  See instructions for specific recommendations which were reviewed directly with the patient who was given a copy with highlighter outlining the key components.

## 2011-07-06 ENCOUNTER — Other Ambulatory Visit: Payer: Self-pay | Admitting: Obstetrics and Gynecology

## 2011-07-06 DIAGNOSIS — Z1231 Encounter for screening mammogram for malignant neoplasm of breast: Secondary | ICD-10-CM

## 2011-07-23 ENCOUNTER — Ambulatory Visit
Admission: RE | Admit: 2011-07-23 | Discharge: 2011-07-23 | Disposition: A | Discharge status: Home / Self Care | Payer: 59 | Source: Ambulatory Visit | Attending: Obstetrics and Gynecology | Admitting: Obstetrics and Gynecology

## 2011-07-23 DIAGNOSIS — Z1231 Encounter for screening mammogram for malignant neoplasm of breast: Secondary | ICD-10-CM

## 2011-07-27 ENCOUNTER — Ambulatory Visit: Payer: 59

## 2011-12-17 ENCOUNTER — Encounter: Payer: Self-pay | Admitting: Internal Medicine

## 2011-12-17 ENCOUNTER — Ambulatory Visit (INDEPENDENT_AMBULATORY_CARE_PROVIDER_SITE_OTHER): Payer: 59 | Admitting: Internal Medicine

## 2011-12-17 ENCOUNTER — Telehealth: Payer: Self-pay | Admitting: Internal Medicine

## 2011-12-17 VITALS — BP 142/80 | HR 85 | Temp 98.0°F | Ht 65.0 in | Wt 141.0 lb

## 2011-12-17 DIAGNOSIS — K219 Gastro-esophageal reflux disease without esophagitis: Secondary | ICD-10-CM

## 2011-12-17 DIAGNOSIS — J45909 Unspecified asthma, uncomplicated: Secondary | ICD-10-CM

## 2011-12-17 MED ORDER — PREDNISONE (PAK) 10 MG PO TABS: ORAL_TABLET | ORAL | Status: AC

## 2011-12-17 NOTE — Progress Notes (Deleted)
Subjective:     Patient ID: Angela [name], female   DOB: [birth year], 56 y.o.   MRN: [id number]  HPI fda  Review of Systems     Objective:   Physical Exam     Assessment:     ***    Plan:     ***

## 2011-12-17 NOTE — Patient Instructions (Addendum)
Prednisone 10 mg take  4 each am x 2 days,   2 each am x 2 days,  1 each am x2days and stop   Whenever you feel tight it's a good idea to go use either prilosec 20 mg before meals twice until symptoms are completely for about a week (or Zantac 150 mg after bfast and at bedtime)  Continue Qvar 80 Take 2 puffs first thing in am and then another 2 puffs about 12 hours later for a month   Will defer to Yancey Flemings re next EGD timing

## 2011-12-17 NOTE — Telephone Encounter (Signed)
I spoke with pt and she is scheduled to come in and see MW this afternoon at 4:00 for an evaluation

## 2011-12-17 NOTE — Progress Notes (Signed)
Subjective:     Patient ID: Angela [name] [name], female   DOB: February 03, 1956    MRN: [id number]  HPI  56 yowf never smoker history of chronic asthma as child, then recurred age 56 with one episode life threatening exacerbation and admit to ICU [facility] around 1993    04/12/2011 f/u ov/Wert cc worse breathing x one month p injured back exercising. Only using qvar 80 2 qam and needing saba more than 2x weekly but not with exercise, no real pattern to flares, no cough or sinus issues or overt hb.  Also concerned about bone density with dexa positive Jan 2012 for osteoporosis and now on Zantac.   Work on inhaler technique:  relax and gently blow all the way out then take a nice smooth deep breath back in, triggering the inhaler at same time you start breathing in.  Hold for up to 5 seconds if you can.  Rinse and gargle with water when done   If your mouth or throat starts to bother you,   I suggest you time the inhaler to your dental care and after using the inhaler(s) brush teeth and tongue with a baking soda containing toothpaste and when you rinse this out, gargle with it first to see if this helps your mouth and throat.    Ok to use qvar 80 2 puffs every am with option for the second if you need because of symptoms or need for rescue.    12/17/2011 f/u ov/Wert cc much worse wheezing/chest tightness x 3 weeks since exp to ? formaldehyde in new bed and since then avg of once a day.  As instructed has increased her qvar from 2 qam to 2 bid at onset but not better and still nneeding more saba than baseline but good relief of symptoms from one dose only.  No cough or sob or overt sinus or hb symptoms.   Sleeping ok without nocturnal  or early am exacerbation  of respiratory  c/o's or need for noct saba. Also denies any obvious fluctuation of symptoms with weather or environmental changes or other aggravating or alleviating factors except as outlined above.  ROS  The following are not active complaints unless  bolded sore throat, dysphagia, dental problems, itching, sneezing,  nasal congestion or excess/ purulent secretions, ear ache,   fever, chills, sweats, unintended wt loss, pleuritic or exertional cp, hemoptysis,  orthopnea pnd or leg swelling, presyncope, palpitations, heartburn, abdominal pain, anorexia, nausea, vomiting, diarrhea  or change in bowel or urinary habits, change in stools or urine, dysuria,hematuria,  rash, arthralgias, visual complaints, headache, numbness weakness or ataxia or problems with walking or coordination,  change in mood/affect or memory.          Past Medical History:  ASTHMA--chronic mild ...........................................................................Marland KitchenWert  GERD (ICD-530.81)--prev. endo 10/2002--GERD/esophageal stricture, on Protonix  HIATAL HERNIA (ICD-553.3)  MITRAL VALVE PROLAPSE (ICD-424.0)  a. echo 4/08: EF 60%. minimal prolapse of posterior leaflet. trivial MR  b. ECH0 4/10: EF 55-60%. mild to moderate MVP with mild MR  Palpitations thought due to SVT  RENAL CALCULUS, HX OF (ICD-V13.01)  HYPERTENSION (ICD-401.9)          Objective:   Physical Exam Very healthy appearing wf   Wt 135 04/12/2011  HEENT: nl dentition, turbinates, and orophanx. Nl external ear canals without cough reflex   NECK :  without JVD/Nodes/TM/ nl carotid upstrokes bilaterally   LUNGS: no acc muscle use, clear to A and P bilaterally without cough on insp or exp  maneuvers   CV:  RRR  no s3 or murmur or increase in P2, no edema   ABD:  soft and nontender with nl excursion in the supine position. No bruits or organomegaly, bowel sounds nl  MS:  warm without deformities, calf tenderness, cyanosis or clubbing  SKIN: warm and dry without lesions          Assessment:         Plan:

## 2011-12-18 NOTE — Assessment & Plan Note (Signed)
Previously well controlled now flare p ? Formaldehyde or some derivative ? Exposure in bedroom.  The proper method of use, as well as anticipated side effects, of a metered-dose inhaler are discussed and demonstrated to the patient. Improved effectiveness after extensive coaching during this visit to a level of approximately  90%  Rec Prednisone 10 mg take  4 each am x 2 days,   2 each am x 2 days,  1 each am x2days and stop  But no change in maint rx  If this is not effective consider laba/ics but she is reluctant to do this based on LABA/ Smart trial data - not needed now but can address next ov the risk vs benefits considering she has h/o life threatening asthma.   See instructions for specific recommendations which were reviewed directly with the patient who was given a copy with highlighter outlining the key components.

## 2011-12-18 NOTE — Assessment & Plan Note (Signed)
Discussed the need for acid suppression for any acute flare of asthma and the risk of Barrett's (small but still  Present) in chronic GERD with f/u due by Dr Marina Goodell this year.

## 2012-05-01 ENCOUNTER — Ambulatory Visit: Payer: 59 | Admitting: Internal Medicine

## 2012-05-03 ENCOUNTER — Encounter: Payer: Self-pay | Admitting: Internal Medicine

## 2012-05-03 ENCOUNTER — Ambulatory Visit (INDEPENDENT_AMBULATORY_CARE_PROVIDER_SITE_OTHER): Payer: 59 | Admitting: Internal Medicine

## 2012-05-03 VITALS — BP 122/74 | HR 76 | Temp 97.9°F | Ht 64.0 in | Wt 129.0 lb

## 2012-05-03 DIAGNOSIS — K219 Gastro-esophageal reflux disease without esophagitis: Secondary | ICD-10-CM

## 2012-05-03 DIAGNOSIS — J45909 Unspecified asthma, uncomplicated: Secondary | ICD-10-CM

## 2012-05-03 DIAGNOSIS — Z23 Encounter for immunization: Secondary | ICD-10-CM

## 2012-05-03 MED ORDER — BECLOMETHASONE DIPROPIONATE 80 MCG/ACT IN AERS: 2.0000 | INHALATION_SPRAY | Freq: Every day | RESPIRATORY_TRACT | Status: DC

## 2012-05-03 MED ORDER — ALBUTEROL SULFATE HFA 108 (90 BASE) MCG/ACT IN AERS: 2.0000 | INHALATION_SPRAY | RESPIRATORY_TRACT | Status: DC | PRN

## 2012-05-03 NOTE — Patient Instructions (Addendum)
Whenever you feel tight it's a good idea to Zantac 150 mg after bfast and at bedtime  Continue Qvar 80 Take 2 puffs first thing in am and for any flare use it  again another 2 puffs about 12 hours later and hold the fish oil  Will defer to Yancey Flemings re next EGD timing   Return in one year, sooner if needed

## 2012-05-03 NOTE — Progress Notes (Signed)
Subjective:     Patient ID: Angela [name], female   DOB: [birth year]    MRN: [id number]  Brief patient profile:  56 yowf never smoker history of chronic asthma as child, then recurred age 56 with one episode life threatening exacerbation and admit to ICU [facility] around 1993    04/12/2011 f/u ov/Catia Todorov cc worse breathing x one month p injured back exercising. Only using qvar 80 2 qam and needing saba more than 2x weekly but not with exercise, no real pattern to flares, no cough or sinus issues or overt hb.  Also concerned about bone density with dexa positive Jan 2012 for osteoporosis and now on Zantac instead of ppi.  rec Work on inhaler technique:    Ok to use qvar 80 2 puffs every am with option for the second if you need because of symptoms or need for rescue.    12/17/2011 f/u ov/Annett Boxwell cc much worse wheezing/chest tightness x 3 weeks since exp to ? formaldehyde in new bed and since then avg of once a day.   Prednisone 10 mg take  4 each am x 2 days,   2 each am x 2 days,  1 each am x2days and stop  Whenever you feel tight it's a good idea to go use either prilosec 20 mg before meals twice until symptoms are completely for about a week (or Zantac 150 mg after bfast and at bedtime) Continue Qvar 80 Take 2 puffs first thing in am and then another 2 puffs about 12 hours later for a month  Will defer to Yancey Flemings re next EGD timing    05/03/12 ov/ Miaya Lafontant : yearly asthma f/u on qvar 80 2 qam with no need for daytime saba and main c/o throat feels congested each am, better as day goes on, using only zantac 75 mg at bedtime. No excess mucus production or obvious sinus/ reflux symptoms or variability in tems of this c/o which has  Been present for months and not progressive.  Using fish oil in am only at rec of [facility] doctor   Sleeping ok without nocturnal  or early am exacerbation  of respiratory  c/o's or need for noct saba. Also denies any obvious fluctuation of symptoms with weather or environmental  changes or other aggravating or alleviating factors except as outlined above.  ROS  The following are not active complaints unless bolded sore throat, dysphagia, dental problems, itching, sneezing,  nasal congestion or excess/ purulent secretions, ear ache,   fever, chills, sweats, unintended wt loss, pleuritic or exertional cp, hemoptysis,  orthopnea pnd or leg swelling, presyncope, palpitations, heartburn, abdominal pain, anorexia, nausea, vomiting, diarrhea  or change in bowel or urinary habits, change in stools or urine, dysuria,hematuria,  rash, arthralgias, visual complaints, headache, numbness weakness or ataxia or problems with walking or coordination,  change in mood/affect or memory.          Past Medical History:  ASTHMA--chronic mild ...........................................................................Marland KitchenWert  GERD (ICD-530.81)--prev. endo 10/2002--GERD/esophageal stricture HIATAL HERNIA (ICD-553.3)  MITRAL VALVE PROLAPSE (ICD-424.0)  a. echo 4/08: EF 60%. minimal prolapse of posterior leaflet. trivial MR  b. ECH0 4/10: EF 55-60%. mild to moderate MVP with mild MR  Palpitations thought due to SVT  RENAL CALCULUS, HX OF (ICD-V13.01)  HYPERTENSION (ICD-401.9)          Objective:   Physical Exam  Very healthy appearing wf   Wt 135 04/12/2011 > 129 05/03/12  HEENT: nl dentition, turbinates, and orophanx. Nl external ear canals  without cough reflex   NECK :  without JVD/Nodes/TM/ nl carotid upstrokes bilaterally   LUNGS: no acc muscle use, clear to A and P bilaterally without cough on insp or exp maneuvers   CV:  RRR  no s3 or murmur or increase in P2, no edema   ABD:  soft and nontender with nl excursion in the supine position. No bruits or organomegaly, bowel sounds nl  MS:  warm without deformities, calf tenderness, cyanosis or clubbing  SKIN: warm and dry without lesions          Assessment:         Plan:

## 2012-05-04 NOTE — Assessment & Plan Note (Signed)
The am throat congestion s variability or assoc with sinus complaints is classic for GERD which we know she has and is not treating aggressively due to concerns with osteo from ppi  rec  Increase Zantac to 150 mg qhs maint and bid dosing for any flare  Contact Dr Marina Goodell re ? Repeat egd at some point having had stricture previously puts her at risk of recurrence with "under treatment" that need to be balanced against risk of osteo from treatment.

## 2012-05-04 NOTE — Assessment & Plan Note (Signed)
I had an extended discussion with the patient today lasting 15 to 20 minutes of a 25 minute visit on the following issues:   All goals of chronic asthma control met including optimal function and elimination of symptoms with minimal need for rescue therapy.  Contingencies discussed in full including contacting this office immediately if not controlling the symptoms using the rule of two's.     See separate discussion of am throat congestion under gerd dx

## 2012-06-30 ENCOUNTER — Telehealth: Payer: Self-pay | Admitting: Internal Medicine

## 2012-06-30 NOTE — Telephone Encounter (Signed)
Called, spoke with pt.  She would like a referral to ENT.  States she's had a cold that everyone has had but sore throat is still lingering for > 2 wks.  Reports other symptoms have resolved.  Dr. Sherene Sires, pls advise.  Thank you.  If calling back on Monday, pls call pt's cell at [phone].  Per pt, ok to leave a detail msg on either work or cell #.

## 2012-06-30 NOTE — Telephone Encounter (Signed)
[email or web address] to discuss MW recs.

## 2012-06-30 NOTE — Telephone Encounter (Signed)
Ok to refer to [facility] but this may take weeks in meantime options are  A) Try prilosec 20mg   Take 30-60 min before first meal of the day and zantac 300 mg at bedtime while waiting for ov  B) see Tammy NP

## 2012-07-03 ENCOUNTER — Telehealth: Payer: Self-pay | Admitting: Internal Medicine

## 2012-07-03 DIAGNOSIS — R059 Cough, unspecified: Secondary | ICD-10-CM

## 2012-07-03 DIAGNOSIS — R05 Cough: Secondary | ICD-10-CM

## 2012-07-03 NOTE — Telephone Encounter (Signed)
Previous msg closed in error.  See prior notes below.  The pt is aware of MW recs and says she really does not think this is GERD. She is fine with referral to ENT but will call back to schedule OV with TP if it is going to take a long time to get in with Dr. Pollyann Kennedy.

## 2012-07-03 NOTE — Telephone Encounter (Signed)
Randell Loop, [facility] 06/30/2012 2:19 PM Signed  [email or web address] to discuss MW recs. Sandrea Hughs, MD 06/30/2012 2:03 PM Signed  Ok to refer to [facility] but this may take weeks in meantime options are  A) Try prilosec 20mg  Take 30-60 min before first meal of the day and zantac 300 mg at bedtime while waiting for ov  B) see Tammy NP Gweneth Dimitri, RN 06/30/2012 1:49 PM Signed  Called, spoke with pt. She would like a referral to ENT. States she's had a cold that everyone has had but sore throat is still lingering for > 2 wks. Reports other symptoms have resolved. Dr. Sherene Sires, pls advise. Thank you.  If calling back on Monday, pls call pt's cell at [phone]. Per pt, ok to leave a detail msg on either work or cell #.

## 2012-07-13 ENCOUNTER — Other Ambulatory Visit: Payer: Self-pay | Admitting: Obstetrics and Gynecology

## 2012-07-13 DIAGNOSIS — Z1231 Encounter for screening mammogram for malignant neoplasm of breast: Secondary | ICD-10-CM

## 2012-07-19 ENCOUNTER — Encounter: Payer: Self-pay | Admitting: Internal Medicine

## 2012-08-15 ENCOUNTER — Encounter: Payer: Self-pay | Admitting: Internal Medicine

## 2012-08-15 ENCOUNTER — Ambulatory Visit (INDEPENDENT_AMBULATORY_CARE_PROVIDER_SITE_OTHER): Payer: 59 | Admitting: Internal Medicine

## 2012-08-15 VITALS — BP 110/80 | HR 80 | Ht 64.0 in | Wt 134.0 lb

## 2012-08-15 DIAGNOSIS — K222 Esophageal obstruction: Secondary | ICD-10-CM

## 2012-08-15 DIAGNOSIS — K219 Gastro-esophageal reflux disease without esophagitis: Secondary | ICD-10-CM

## 2012-08-15 DIAGNOSIS — R1310 Dysphagia, unspecified: Secondary | ICD-10-CM

## 2012-08-15 MED ORDER — PANTOPRAZOLE SODIUM 40 MG PO TBEC: 40.0000 mg | DELAYED_RELEASE_TABLET | Freq: Every day | ORAL | Status: DC

## 2012-08-15 NOTE — Patient Instructions (Addendum)
We have sent the following medications to your pharmacy for you to pick up at your convenience:  Pantoprazole   Please follow up with Dr. Marina Goodell in 1 - 2 years, sooner if needed

## 2012-08-15 NOTE — Progress Notes (Signed)
HISTORY OF PRESENT ILLNESS:  Angela [name] is a 57 y.o. female with a history of asthma, osteoporosis, and GERD complicated by erosive esophagitis and peptic stricture requiring esophageal dilation in 2004. Post dilation in 2004 she was placed on PPI therapy and did well. She was evaluated in June of 2009 for screening colonoscopy (moderate diverticulosis) in July of 2009 for upper endoscopy to evaluate globus type sensation. The examination was normal with no evidence of Barrett's esophagus, active esophagitis, or significant recurrent esophageal stricture. She had been on higher dose PPI and at night H2 receptor antagonist therapy for some time with improvement in globus symptoms. I saw her in followup September 2010 to discuss the benefits and possible risks of PPI therapy, particularly as that therapy may relate to bone density. It was elected to continue PPI. Routine followup in one year requested. She has not been seen since. She has had progression of her bone disease from serial bone density scans. At one point, approximately 2 or more years ago, she discontinued PPI. Despite this, followup bone density scans worsened. She does tell me that prior problems with globus sensation were improved after massage therapy to the neck region. About 8 months ago she awoke with a sensation of thickening in the tongue region and subsequently issues with sore throat. She was evaluated by several physicians, including ENT, who suggested GERD as the diagnosis. She made multiple dietary changes in an effort to improve symptoms. No improvement. Subsequently took a short course of PPI with improvement in symptoms. She does tell me that she has had some minor esophageal type dysphagia, noticeable to pancakes, more recently. Appropriately so, she has questions regarding most appropriate therapy for her esophageal disease as well as concerns related to her bone density. GI review of systems is otherwise negative.  REVIEW OF  SYSTEMS:  All non-GI ROS negative except for sore throat  Past Medical History  Diagnosis Date  . Asthma   . GERD (gastroesophageal reflux disease)   . MVP (mitral valve prolapse)   . HH (hiatus hernia)   . Renal calculus   . Hypertension   . Elevated C-reactive protein (CRP)   . Diverticulosis   . Esophageal stricture     History reviewed. No pertinent past surgical history.  Social History [name]  reports that she has never smoked. She has never used smokeless tobacco. She reports that  drinks alcohol. She reports that she does not use illicit drugs.  family history includes Anuerysm in her mother; Breast cancer in her maternal aunt; Heart attack in her father; and Heart disease in her maternal uncle.  Allergies  Allergen Reactions  . Codeine Nausea And Vomiting       PHYSICAL EXAMINATION: Vital signs: BP 110/80  Pulse 80  Ht 5\' 4"  (1.626 m)  Wt 134 lb (60.782 kg)  BMI 22.99 kg/m2 General: Well-developed, well-nourished, no acute distress HEENT: Sclerae are anicteric, conjunctiva pink. Oral mucosa intact Lungs: Clear Heart: Regular Abdomen: soft, nontender, nondistended, no obvious ascites, no peritoneal signs, normal bowel sounds. No organomegaly. Extremities: No edema Psychiatric: alert and oriented x3. Cooperative   ASSESSMENT:  #1. GERD with documented erosive esophagitis and peptic stricture. #2. Various pharyngeal complaints, over time, likely related to reflux #3. Abnormal bone density. Patient with concerns over the effects of PPIs on bone density, as raised by the lay press #4. Screening colonoscopy in June 2009 revealing moderate diverticulosis   PLAN:  #1. Again, reviewed the most current available medical  literature addressing PPIs and bone density issues. These were reviewed at a recently attended [facility] GI board review course. Currently, not strong support for cause-and-effect. Also, reviewed medical literature addressing the  benefits of PPI therapy in patients with complicated esophageal disease (ulcerative esophagitis and stricture requiring dilation). Additionally, discussed possible benefits of PPI in relieving active pharyngeal symptoms. Finally, raised concerns over what sounds like recurrent esophageal dysphagia, with possible recurring stricture and/or esophagitis. To this end, twice daily H2 receptor antagonist therapy was offered as an alternative to PPI. At this point, after her consideration, she elects to resume once daily pantoprazole 40 mg. In terms of her bone density issues, she will continue to work with her team at [facility]. We spent greater than 30 minutes reviewing these issues counseling the patient. I would like to see her for routine followup in 1-2 years. She knows to contact the office in the interim for any questions or problems. #2. Repeat screening colonoscopy 2019

## 2012-08-21 ENCOUNTER — Ambulatory Visit
Admission: RE | Admit: 2012-08-21 | Discharge: 2012-08-21 | Disposition: A | Discharge status: Home / Self Care | Payer: 59 | Source: Ambulatory Visit | Attending: Obstetrics and Gynecology | Admitting: Obstetrics and Gynecology

## 2012-08-21 DIAGNOSIS — Z1231 Encounter for screening mammogram for malignant neoplasm of breast: Secondary | ICD-10-CM

## 2013-05-15 ENCOUNTER — Encounter: Payer: Self-pay | Admitting: Internal Medicine

## 2013-05-15 ENCOUNTER — Ambulatory Visit (INDEPENDENT_AMBULATORY_CARE_PROVIDER_SITE_OTHER): Payer: 59 | Admitting: Internal Medicine

## 2013-05-15 ENCOUNTER — Encounter (INDEPENDENT_AMBULATORY_CARE_PROVIDER_SITE_OTHER): Payer: Self-pay

## 2013-05-15 VITALS — BP 120/82 | HR 75 | Temp 98.1°F | Ht 64.0 in | Wt 131.0 lb

## 2013-05-15 DIAGNOSIS — K219 Gastro-esophageal reflux disease without esophagitis: Secondary | ICD-10-CM

## 2013-05-15 DIAGNOSIS — J45909 Unspecified asthma, uncomplicated: Secondary | ICD-10-CM

## 2013-05-15 MED ORDER — ALBUTEROL SULFATE HFA 108 (90 BASE) MCG/ACT IN AERS: INHALATION_SPRAY | RESPIRATORY_TRACT | Status: DC

## 2013-05-15 MED ORDER — BECLOMETHASONE DIPROPIONATE 80 MCG/ACT IN AERS: 2.0000 | INHALATION_SPRAY | Freq: Every day | RESPIRATORY_TRACT | Status: DC

## 2013-05-15 NOTE — Assessment & Plan Note (Signed)
rec add zantac 150 mg at hs to the nexium q am ac to get 24 h suppression and consider d/c prometrium  Also advised no medical treatment for non acid reflux so reviewed diet including concern re fish oil     Each maintenance medication was reviewed in detail including most importantly the difference between maintenance and as needed and under what circumstances the prns are to be used.  Please see instructions for details which were reviewed in writing and the patient given a copy.

## 2013-05-15 NOTE — Progress Notes (Signed)
Subjective:     Patient ID: Angela [name] [facility], female   DOB: [birth year]    MRN: [id number]    Brief patient profile:  57 yowf never smoker history of chronic asthma as child, then recurred age 57 with one episode life threatening exacerbation and admit to ICU [facility] around 1993   History of Present Illness  04/12/2011 f/u ov/Myiah Petkus cc worse breathing x one month p injured back exercising. Only using qvar 80 2 qam and needing saba more than 2x weekly but not with exercise, no real pattern to flares, no cough or sinus issues or overt hb.  Also concerned about bone density with dexa positive Jan 2012 for osteoporosis and now on Zantac instead of ppi.  rec Work on inhaler technique:    Ok to use qvar 80 2 puffs every am with option for the second if you need because of symptoms or need for rescue.    12/17/2011 f/u ov/Chelcey Caputo cc much worse wheezing/chest tightness x 3 weeks since exp to ? formaldehyde in new bed and since then avg of once a day.   Prednisone 10 mg take  4 each am x 2 days,   2 each am x 2 days,  1 each am x2days and stop  Whenever you feel tight it's a good idea to go use either prilosec 20 mg before meals twice until symptoms are completely for about a week (or Zantac 150 mg after bfast and at bedtime) Continue Qvar 80 Take 2 puffs first thing in am and then another 2 puffs about 12 hours later for a month  Will defer to Yancey Flemings re next EGD timing    05/03/12 ov/ Esdras Delair : yearly asthma f/u on qvar 80 2 qam with no need for daytime saba and main c/o throat feels congested each am, better as day goes on, using only zantac 75 mg at bedtime. rec Whenever you feel tight it's a good idea to Zantac 150 mg after bfast and at bedtime Continue Qvar 80 Take 2 puffs first thing in am and for any flare use it  again another 2 puffs about 12 hours later and hold the fish oil    05/15/2013 f/u ov/Nisaiah Bechtol re:  Chronic asthma rx Chief Complaint  Patient presents with  . Follow-up    Pt states here  for annual f/u. Her breathing is doing well and she denies any new co's today.   worse symptom is throat rawness esp in am not using the pm zantac, just am nexium On premetrium for post menopausal symptoms    No obvious day to day or daytime variabilty or assoc chronic cough/ excess am mucus  or cp or chest tightness, subjective wheeze overt sinus or hb symptoms. No unusual exp hx or h/o childhood pna/ asthma or knowledge of premature birth.  Sleeping ok without nocturnal  or early am exacerbation  of respiratory  c/o's or need for noct saba. Also denies any obvious fluctuation of symptoms with weather or environmental changes or other aggravating or alleviating factors except as outlined above   Current Medications, Allergies, Complete Past Medical History, Past Surgical History, Family History, and Social History were reviewed in [employer] record.  ROS  The following are not active complaints unless bolded sore throat, dysphagia, dental problems, itching, sneezing,  nasal congestion or excess/ purulent secretions, ear ache,   fever, chills, sweats, unintended wt loss, pleuritic or exertional cp, hemoptysis,  orthopnea pnd or leg swelling, presyncope, palpitations, heartburn, abdominal  pain, anorexia, nausea, vomiting, diarrhea  or change in bowel or urinary habits, change in stools or urine, dysuria,hematuria,  rash, arthralgias, visual complaints, headache, numbness weakness or ataxia or problems with walking or coordination,  change in mood/affect or memory.           Past Medical History:  ASTHMA--chronic mild ...........................................................................Marland KitchenWert  - h/o status asthmaticus/ vent dep 1993 GERD (ICD-530.81)--prev. endo 10/2002--GERD/esophageal stricture HIATAL HERNIA (ICD-553.3)  MITRAL VALVE PROLAPSE (ICD-424.0)  a. echo 4/08: EF 60%. minimal prolapse of posterior leaflet. trivial MR  b. ECH0 4/10: EF 55-60%. mild to  moderate MVP with mild MR  Palpitations thought due to SVT  RENAL CALCULUS, HX OF (ICD-V13.01)  HYPERTENSION (ICD-401.9)          Objective:   Physical Exam  Very healthy appearing wf   Wt 135 04/12/2011 > 129 05/03/12 > 05/15/2013  131   HEENT: nl dentition, turbinates, and orophanx. N    NECK :  without JVD/Nodes/TM/    LUNGS: no acc muscle use, clear to A and P bilaterally without cough on insp or exp maneuvers   CV:  RRR  no s3 or murmur or increase in P2, no edema   ABD:  soft and nontender   MS: nl gait,  without deformities, calf tenderness, cyanosis or clubbing           Assessment:

## 2013-05-15 NOTE — Patient Instructions (Addendum)
Add Zantac 150 mg at bedtime for any flare of respiratory or throat symptoms  GERD (REFLUX)  is an extremely common cause of respiratory symptoms, many times with no significant heartburn at all.    It can be treated with medication, but also with lifestyle changes including avoidance of late meals, excessive alcohol, smoking cessation, and avoid fatty foods, chocolate, peppermint, colas, red wine, and acidic juices such as orange juice.  NO MINT OR MENTHOL PRODUCTS SO NO COUGH DROPS  USE SUGARLESS CANDY INSTEAD (jolley ranchers or Stover's)  AVOID  OIL BASED VITAMINS - use powdered substitutes  when feasible   Please schedule a follow up office visit in 1 year call sooner if needed

## 2013-05-15 NOTE — Assessment & Plan Note (Signed)
All goals of chronic asthma control met including optimal function and elimination of symptoms with minimal need for rescue therapy.  Contingencies discussed in full including contacting this office immediately if not controlling the symptoms using the rule of two's.    

## 2013-06-12 ENCOUNTER — Ambulatory Visit (INDEPENDENT_AMBULATORY_CARE_PROVIDER_SITE_OTHER): Payer: 59 | Admitting: Internal Medicine

## 2013-06-12 ENCOUNTER — Encounter: Payer: Self-pay | Admitting: Internal Medicine

## 2013-06-12 VITALS — BP 118/78 | HR 55 | Ht 64.0 in | Wt 131.0 lb

## 2013-06-12 DIAGNOSIS — K219 Gastro-esophageal reflux disease without esophagitis: Secondary | ICD-10-CM

## 2013-06-12 DIAGNOSIS — K222 Esophageal obstruction: Secondary | ICD-10-CM

## 2013-06-12 NOTE — Progress Notes (Signed)
HISTORY OF PRESENT ILLNESS:  Angela [name] is a 57 y.o. female with a history of asthma, osteoporosis, and GERD complicated by erosive esophagitis and peptic stricture requiring esophageal dilation 2004. Post dilation 2004 she was placed on PPI therapy and has done well. In June of 2009 she underwent screening colonoscopy (moderate diverticulosis) and in July of 2009 upper endoscopy to evaluate globus sensation (normal examination without evidence of esophagitis, Barrett's, or stricture). Recurrent discussions with the patient regarding PPI therapy and possible effects on bone density. Active monitoring of current literature on topic ensuring with patient. Last seen in the office 08/15/2012. See that dictation for details. Presents now for routine followup. Next time of her last visit I was under the impression that she was being seen at [facility] by bone density expert. However, being seen as part of executive wellness program. She is currently on Nexium 40 mg daily without classic reflux symptoms. Ongoing intermittent pharyngeal complaints. No esophageal dysphagia. He continues on inhaled steroids and albuterol with good control of her asthma. GI review of systems is otherwise negative  REVIEW OF SYSTEMS:  All non-GI ROS negative except for sore throat  Past Medical History  Diagnosis Date  . Asthma   . GERD (gastroesophageal reflux disease)   . MVP (mitral valve prolapse)   . HH (hiatus hernia)   . Renal calculus   . Hypertension   . Elevated C-reactive protein (CRP)   . Diverticulosis   . Esophageal stricture     History reviewed. No pertinent past surgical history.  Social History Angela [name]  reports that she has never smoked. She has never used smokeless tobacco. She reports that she drinks alcohol. She reports that she does not use illicit drugs.  family history includes Anuerysm in her mother; Breast cancer in her maternal aunt; Heart attack in her father; Heart disease in her  maternal uncle.  Allergies  Allergen Reactions  . Codeine Nausea And Vomiting       PHYSICAL EXAMINATION: Vital signs: BP 118/78  Pulse 55  Ht 5\' 4"  (1.626 m)  Wt 131 lb (59.421 kg)  BMI 22.47 kg/m2 General: Well-developed, well-nourished, no acute distress HEENT: Sclerae are anicteric, conjunctiva pink. Oral mucosa intact Lungs: Clear Heart: Regular Abdomen: soft, nontender, nondistended, no obvious ascites, no peritoneal signs, normal bowel sounds. No organomegaly. Extremities: No edema Psychiatric: alert and oriented x3. Cooperative    ASSESSMENT:  #1. GERD with documented erosive esophagitis and symptomatic peptic stricture requiring dilation (2004). Currently asymptomatic on daily PPI post dilation. #2. Pharyngeal complaints. In part may be due to GERD. Not entirely clear. Discussed #3. Screening colonoscopy 2009 with diverticulosis only #4. Osteoporosis and asthma   PLAN:  #1. Reflux precautions #2. Continue PPI #3. Repeat screening colonoscopy routinely around 2019 #4. Recommended that the patient see an endocrinologist at [facility] who specializes in osteoporosis. They can help with her assessment and optimize medical therapy and therapeutic options. I can help her with referral if needed. #5. Routine GI followup 1-2 years, sooner if needed

## 2013-06-12 NOTE — Patient Instructions (Signed)
Please follow up with Dr. Marina Goodell in 1-2 years

## 2013-08-02 ENCOUNTER — Other Ambulatory Visit: Payer: Self-pay

## 2013-08-02 DIAGNOSIS — Z1231 Encounter for screening mammogram for malignant neoplasm of breast: Secondary | ICD-10-CM

## 2013-08-24 ENCOUNTER — Ambulatory Visit
Admission: RE | Admit: 2013-08-24 | Discharge: 2013-08-24 | Disposition: A | Discharge status: Home / Self Care | Payer: 59 | Source: Ambulatory Visit

## 2013-08-24 DIAGNOSIS — Z1231 Encounter for screening mammogram for malignant neoplasm of breast: Secondary | ICD-10-CM

## 2014-03-29 ENCOUNTER — Other Ambulatory Visit: Payer: Self-pay

## 2014-05-22 ENCOUNTER — Other Ambulatory Visit: Payer: Self-pay | Admitting: Internal Medicine

## 2014-05-24 ENCOUNTER — Ambulatory Visit (INDEPENDENT_AMBULATORY_CARE_PROVIDER_SITE_OTHER): Payer: 59 | Admitting: Internal Medicine

## 2014-05-24 ENCOUNTER — Encounter: Payer: Self-pay | Admitting: Internal Medicine

## 2014-05-24 ENCOUNTER — Ambulatory Visit: Payer: 59 | Admitting: Internal Medicine

## 2014-05-24 VITALS — BP 124/86 | HR 80 | Temp 98.7°F | Wt 138.0 lb

## 2014-05-24 DIAGNOSIS — J4530 Mild persistent asthma, uncomplicated: Secondary | ICD-10-CM

## 2014-05-24 DIAGNOSIS — J069 Acute upper respiratory infection, unspecified: Secondary | ICD-10-CM

## 2014-05-24 DIAGNOSIS — K219 Gastro-esophageal reflux disease without esophagitis: Secondary | ICD-10-CM

## 2014-05-24 MED ORDER — BECLOMETHASONE DIPROPIONATE 80 MCG/ACT IN AERS: INHALATION_SPRAY | RESPIRATORY_TRACT | Status: DC

## 2014-05-24 MED ORDER — AZITHROMYCIN 250 MG PO TABS: ORAL_TABLET | ORAL | Status: DC

## 2014-05-24 MED ORDER — PREDNISONE 10 MG PO TABS: ORAL_TABLET | ORAL | Status: DC

## 2014-05-24 NOTE — Progress Notes (Signed)
Subjective:     Patient ID: Angela [name] [facility], female   DOB: 1956/03/03    MRN: [id number]    Brief patient profile:  58 yowf never smoker history of chronic asthma as child, then recurred age 58 with one episode life threatening exacerbation and admit to ICU [facility] in 1993 and on maint rx eer since with ICS.   History of Present Illness  04/12/2011 f/u ov/Jakeline Dave cc worse breathing x one month p injured back exercising. Only using qvar 80 2 qam and needing saba more than 2x weekly but not with exercise, no real pattern to flares, no cough or sinus issues or overt hb. Also concerned about bone density with dexa positive Jan 2012 for osteoporosis and now on Zantac instead of ppi.  rec Work on inhaler technique:    Ok to use qvar 80 2 puffs every am with option for the second if you need because of symptoms or need for rescue.    05/15/2013 f/u ov/Terralyn Matsumura re:  Chronic asthma rx Chief Complaint  Patient presents with  . Follow-up    Pt states here for annual f/u. Her breathing is doing well and she denies any new co's today.   worse symptom is throat rawness esp in am not using the pm zantac, just am nexium On prometrium for post menopausal symptoms  rec Add Zantac 150 mg at bedtime for any flare of respiratory or throat symptoms GERD diet   05/24/2014 f/u ov/Khori Underberg re: mild persitant asthma/ qvar 80 2 qam Chief Complaint  Patient presents with  . Follow-up    Pt here for medication refills. She c/o cough on and off since traveled to [place] about 4 wks ago- cough is occ prod, sputum gets stuck in her throat. She had low grade temp x 1 wk ago that only lasted for a day. She uses rescue inhaler about once every month on average.   cough is 24/7 intermittently / rattly / random onset s pattern  Onset was 3 d p back from [place] via commercial jet. No need for saba    No obvious day to day or daytime variabilty or assoc sob/ excess am mucus  or cp or chest tightness, subjective wheeze overt sinus  or hb symptoms. No unusual exp hx or h/o childhood pna/ asthma or knowledge of premature birth.  Sleeping ok without nocturnal  or early am exacerbation  of respiratory  c/o's or need for noct saba. Also denies any obvious fluctuation of symptoms with weather or environmental changes or other aggravating or alleviating factors except as outlined above   Current Medications, Allergies, Complete Past Medical History, Past Surgical History, Family History, and Social History were reviewed in [employer] record.  ROS  The following are not active complaints unless bolded sore throat, dysphagia, dental problems, itching, sneezing,  nasal congestion or excess/ purulent secretions, ear ache,   fever, chills, sweats, unintended wt loss, pleuritic or exertional cp, hemoptysis,  orthopnea pnd or leg swelling, presyncope, palpitations, heartburn, abdominal pain, anorexia, nausea, vomiting, diarrhea  or change in bowel or urinary habits, change in stools or urine, dysuria,hematuria,  rash, arthralgias, visual complaints, headache, numbness weakness or ataxia or problems with walking or coordination,  change in mood/affect or memory.           Past Medical History:  ASTHMA--chronic mild ...........................................................................Marland KitchenWert  - h/o status asthmaticus/ vent dep 1993 GERD (ICD-530.81)--prev. endo 10/2002--GERD/esophageal stricture HIATAL HERNIA (ICD-553.3)  MITRAL VALVE PROLAPSE (ICD-424.0)  a. echo 4/08:  EF 60%. minimal prolapse of posterior leaflet. trivial MR  b. ECH0 4/10: EF 55-60%. mild to moderate MVP with mild MR  Palpitations thought due to SVT  RENAL CALCULUS, HX OF (ICD-V13.01)  HYPERTENSION (ICD-401.9)          Objective:   Physical Exam  Very healthy appearing amb  wf   Wt 135 04/12/2011 > 129 05/03/12 > 05/15/2013  131 > 05/24/2014  138  HEENT: nl dentition, turbinates, and orophanx. N    NECK :  without  JVD/Nodes/TM/    LUNGS: no acc muscle use, clear to A and P bilaterally without cough on insp or exp maneuvers   CV:  RRR  no s3 or murmur or increase in P2, no edema   ABD:  soft and nontender   MS: nl gait,  without deformities, calf tenderness, cyanosis or clubbing           Assessment:

## 2014-05-24 NOTE — Patient Instructions (Signed)
No change in maintenance medications  qvar 80 2 each am and increase immediately to 2 puffs every 12 hours if any increase need for ventolin   zpak Prednisone 10 mg take  4 each am x 2 days,   2 each am x 2 days,  1 each am x 2 days and stop   Try zantac 150 mg one after bfast and  one @ bedtime until cough is completely gone for at least a week without the need for cough suppression  GERD (REFLUX)  is an extremely common cause of respiratory symptoms just like yours , many times with no obvious heartburn at all.    It can be treated with medication, but also with lifestyle changes including avoidance of late meals, excessive alcohol, smoking cessation, and avoid fatty foods, chocolate, peppermint, colas, red wine, and acidic juices such as orange juice.  NO MINT OR MENTHOL PRODUCTS SO NO COUGH DROPS  USE SUGARLESS CANDY INSTEAD (Jolley ranchers or Stover's or Life Savers) or even ice chips will also do - the key is to swallow to prevent all throat clearing. NO OIL BASED VITAMINS - use powdered substitutes.  Return yearly for refills, sooner if needed     .

## 2014-05-25 DIAGNOSIS — J069 Acute upper respiratory infection, unspecified: Secondary | ICD-10-CM | POA: Insufficient documentation

## 2014-05-25 NOTE — Assessment & Plan Note (Signed)
Despite apparent uri / tracheobronchitis from air travel, All goals of chronic asthma control met including optimal function and elimination of symptoms with minimal need for rescue therapy.  Contingencies discussed in full including contacting this office immediately if not controlling the symptoms using the rule of two's.

## 2014-05-25 NOTE — Assessment & Plan Note (Signed)
p air travel with persistent upper airway cough but not asthma flare  rec  Zpak/ Prednisone 10 mg take  4 each am x 2 days,   2 each am x 2 days,  1 each am x 2 days and stop Zantac bid until cough gone F/u prn      Each maintenance medication was reviewed in detail including most importantly the difference between maintenance and as needed and under what circumstances the prns are to be used.  Please see instructions for details which were reviewed in writing and the patient given a copy.

## 2014-05-25 NOTE — Assessment & Plan Note (Addendum)
-   ENT eval 08/15/13 s active LPR (not acutely ill at that time)  Explained the natural history of uri and why it's necessary in patients at risk to treat GERD aggressively - at least  short term -   to reduce risk of evolving cyclical cough initially  triggered by epithelial injury and a heightened sensitivty to the effects of any upper airway irritants,  most importantly acid - related - then perpetuated by epithelial injury related to the cough itself as the upper airway collapses on itself.  That is, the more sensitive the epithelium becomes once it is damaged by the virus, the more the ensuing irritability> the more the cough, the more the secondary reflux (especially in those prone to reflux) the more the irritation of the sensitive mucosa and so on in a  Classic cyclical pattern.

## 2014-08-12 ENCOUNTER — Other Ambulatory Visit: Payer: Self-pay

## 2014-08-12 DIAGNOSIS — Z1231 Encounter for screening mammogram for malignant neoplasm of breast: Secondary | ICD-10-CM

## 2014-09-05 ENCOUNTER — Ambulatory Visit: Payer: Self-pay

## 2014-09-10 ENCOUNTER — Ambulatory Visit
Admission: RE | Admit: 2014-09-10 | Discharge: 2014-09-10 | Disposition: A | Discharge status: Home / Self Care | Payer: 59 | Source: Ambulatory Visit

## 2014-09-10 DIAGNOSIS — Z1231 Encounter for screening mammogram for malignant neoplasm of breast: Secondary | ICD-10-CM

## 2014-12-04 ENCOUNTER — Encounter: Payer: Self-pay | Admitting: Internal Medicine

## 2015-07-22 ENCOUNTER — Encounter: Payer: Self-pay | Admitting: Adult Health

## 2015-07-22 ENCOUNTER — Other Ambulatory Visit: Payer: 59

## 2015-07-22 ENCOUNTER — Ambulatory Visit (INDEPENDENT_AMBULATORY_CARE_PROVIDER_SITE_OTHER): Payer: 59 | Admitting: Adult Health

## 2015-07-22 VITALS — BP 108/74 | HR 73 | Temp 98.3°F | Ht 64.0 in | Wt 143.0 lb

## 2015-07-22 DIAGNOSIS — J029 Acute pharyngitis, unspecified: Secondary | ICD-10-CM

## 2015-07-22 DIAGNOSIS — J4530 Mild persistent asthma, uncomplicated: Secondary | ICD-10-CM

## 2015-07-22 LAB — BETA STREP SCREEN: Streptococcus, Group A Screen (Direct): NEGATIVE

## 2015-07-22 MED ORDER — ALBUTEROL SULFATE HFA 108 (90 BASE) MCG/ACT IN AERS: INHALATION_SPRAY | RESPIRATORY_TRACT | Status: DC

## 2015-07-22 MED ORDER — BECLOMETHASONE DIPROPIONATE 80 MCG/ACT IN AERS: INHALATION_SPRAY | RESPIRATORY_TRACT | Status: DC

## 2015-07-22 NOTE — Assessment & Plan Note (Signed)
Appears to be viral  Check strep test   Plan  Strep test today .  May use Claritin or Allegra daily As needed  Drainage.  Saline nasal rinses As needed   Tylenol As needed   Fluids and rest  Salt water gargles As needed   Continue on QVAR 2 puffs Twice daily  , rinse well after use.  Please contact office for sooner follow up if symptoms do not improve or worsen or seek emergency care  Follow up Dr. Melvyn Novas  In 6 months and As needed

## 2015-07-22 NOTE — Progress Notes (Signed)
Subjective:     Patient ID: Angela [name], female   DOB: 26-May-1956    MRN: [id number]  Brief patient profile:  16 yowf never smoker history of chronic asthma as child, then recurred age 60 with one episode life threatening exacerbation and admit to ICU [facility] in 1993 and on maint rx ever since with ICS.   07/22/2015 Acute OV: Sore throat/Mild persistent asthma  Presents for an acute office visit.  Complains of 1 week of sore throat, drainage and , hoarseness x 1 week. Minimal cough ,non productive.  No fever, discolored mucus, cough, congestion, wheezing, increased SABA use.  Good appetite w/ no n/v/d. No recent travel or abx use. No weight loss.  No hemoptysis, chest pain, calf pain , swelling .  Not using any otc meds for treatment.  Rare SABA use .  Says she feels fine except for sore throat and hoarseness. Drainage worse at night.  Coworkers sick at office.  Last spirometry 2010 w/ norm lung function. Never smoker.  Uses QVAR Twice daily  , says she is compliant.   Current Medications, Allergies, Complete Past Medical History, Past Surgical History, Family History, and Social History were reviewed in [employer] record.  ROS  The following are not active complaints unless bolded  dysphagia, dental problems, itching, sneezing,  nasal congestion or excess/ purulent secretions, ear ache,   fever, chills, sweats, unintended wt loss, pleuritic or exertional cp, hemoptysis,  orthopnea pnd or leg swelling, presyncope, palpitations, heartburn, abdominal pain, anorexia, nausea, vomiting, diarrhea  or change in bowel or urinary habits, change in stools or urine, dysuria,hematuria,  rash, arthralgias, visual complaints, headache, numbness weakness or ataxia or problems with walking or coordination,  change in mood/affect or memory.        Past Medical History:  ASTHMA--chronic mild ...........................................................................Marland KitchenWert  - h/o  status asthmaticus/ vent dep 1993 GERD (ICD-530.81)--prev. endo 10/2002--GERD/esophageal stricture HIATAL HERNIA (ICD-553.3)  MITRAL VALVE PROLAPSE (ICD-424.0)  a. echo 4/08: EF 60%. minimal prolapse of posterior leaflet. trivial MR  b. ECH0 4/10: EF 55-60%. mild to moderate MVP with mild MR  Palpitations thought due to SVT  RENAL CALCULUS, HX OF (ICD-V13.01)  HYPERTENSION (ICD-401.9)  Current Outpatient Prescriptions on File Prior to Visit  Medication Sig Dispense Refill  . estradiol (EVAMIST) 1.53 MG/SPRAY transdermal spray Place 2 sprays onto the skin daily.    . Multiple Vitamins-Minerals (CENTRUM SILVER PO) Take 1 tablet by mouth daily.    . Omega-3 Fatty Acids (FISH OIL PO) Take 1,800 mg by mouth daily.    . Progesterone Micronized (PROMETRIUM PO) Take 1 capsule by mouth daily.       No current facility-administered medications on file prior to visit.           Objective:   Physical Exam Filed Vitals:   07/22/15 1148  BP: 108/74  Pulse: 73  Temp: 98.3 F (36.8 C)  TempSrc: Oral  Height: 5\' 4"  (1.626 m)  Weight: 143 lb (64.864 kg)  SpO2: 98%    Very healthy appearing amb  wf   Wt 135 04/12/2011 > 129 05/03/12 > 05/15/2013  131 > 05/24/2014  138>143 07/22/2015   HEENT: nl dentition, turbinates, and orophanx. EAC clear . TM nml  Post pharynx clear.    NECK :  without JVD/Nodes/TM/    LUNGS: no acc muscle use, clear to A and P bilaterally without cough on insp or exp maneuvers   CV:  RRR  no s3 or murmur or  increase in P2, no edema   ABD:  soft and nontender   MS: nl gait,  without deformities, calf tenderness, cyanosis or clubbing           Assessment:

## 2015-07-22 NOTE — Progress Notes (Signed)
Chart and office note reviewed in detail  > agree with a/p as outlined    

## 2015-07-22 NOTE — Patient Instructions (Signed)
Strep test today .  May use Claritin or Allegra daily As needed  Drainage.  Saline nasal rinses As needed   Tylenol As needed   Fluids and rest  Salt water gargles As needed   Continue on QVAR 2 puffs Twice daily  , rinse well after use.  Please contact office for sooner follow up if symptoms do not improve or worsen or seek emergency care  Follow up Dr. Melvyn Novas  In 6 months and As needed

## 2015-07-22 NOTE — Progress Notes (Signed)
Quick Note:  Called and spoke with patient. Reviewed results and recs. Pt voiced understanding and had no further questions. ______ 

## 2015-07-22 NOTE — Assessment & Plan Note (Signed)
Appears good control advised if flares to call back for sooner evaluation   Plan  Strep test today .  May use Claritin or Allegra daily As needed  Drainage.  Saline nasal rinses As needed   Tylenol As needed   Fluids and rest  Salt water gargles As needed   Continue on QVAR 2 puffs Twice daily  , rinse well after use.  Please contact office for sooner follow up if symptoms do not improve or worsen or seek emergency care  Follow up Dr. Melvyn Novas  In 6 months and As needed

## 2015-09-08 ENCOUNTER — Other Ambulatory Visit: Payer: Self-pay

## 2015-09-08 DIAGNOSIS — Z1231 Encounter for screening mammogram for malignant neoplasm of breast: Secondary | ICD-10-CM

## 2015-09-18 ENCOUNTER — Ambulatory Visit
Admission: RE | Admit: 2015-09-18 | Discharge: 2015-09-18 | Disposition: A | Discharge status: Home / Self Care | Payer: 59 | Source: Ambulatory Visit

## 2015-09-18 DIAGNOSIS — Z1231 Encounter for screening mammogram for malignant neoplasm of breast: Secondary | ICD-10-CM

## 2016-01-26 ENCOUNTER — Encounter: Payer: Self-pay | Admitting: Internal Medicine

## 2016-01-26 ENCOUNTER — Ambulatory Visit (INDEPENDENT_AMBULATORY_CARE_PROVIDER_SITE_OTHER): Payer: 59 | Admitting: Internal Medicine

## 2016-01-26 VITALS — BP 118/70 | HR 85 | Ht 64.0 in | Wt 151.8 lb

## 2016-01-26 DIAGNOSIS — J4530 Mild persistent asthma, uncomplicated: Secondary | ICD-10-CM | POA: Diagnosis not present

## 2016-01-26 LAB — NITRIC OXIDE: NITRIC OXIDE: 27

## 2016-01-26 MED ORDER — BECLOMETHASONE DIPROPIONATE 80 MCG/ACT IN AERS: INHALATION_SPRAY | RESPIRATORY_TRACT | 11 refills | Status: DC

## 2016-01-26 NOTE — Progress Notes (Signed)
Subjective:     Patient ID: Bonner [facility], female   DOB: 1956-04-27    MRN: [id number]    Brief patient profile:  60  yowf never smoker history of chronic asthma as child, then recurred age 60 with one episode life threatening exacerbation and admit to ICU [facility] in 1993 and on maint rx ever since with ICS.   07/22/2015 Acute OV: Sore throat/Mild persistent asthma  Presents for an acute office visit.  Complains of 1 week of sore throat, drainage and , hoarseness x 1 week. Minimal cough ,non productive.  No fever, discolored mucus, cough, congestion, wheezing, increased SABA use.  Good appetite w/ no n/v/d. No recent travel or abx use. No weight loss.  No hemoptysis, chest pain, calf pain , swelling .  Not using any otc meds for treatment.  Rare SABA use .  Says she feels fine except for sore throat and hoarseness. Drainage worse at night.  Coworkers sick at office.  Last spirometry 2010 w/ norm lung function. Never smoker.  Uses QVAR Twice daily  , says she is compliant.  rec Strep test today .  May use Claritin or Allegra daily As needed  Drainage.  Saline nasal rinses As needed   Tylenol As needed   Fluids and rest  Salt water gargles As needed   Continue on QVAR 2 puffs Twice daily  , rinse well after use.     01/26/2016  f/u ov/Maitland Lesiak re: chronic asthma  Chief Complaint  Patient presents with  . Follow-up    6 month rov.  needs qvar refill.  denies any complaints today.     some symptoms in [place]  Heat this summer and with and occ heavy exertion/ chest  Tight always better with saba  avg use of saba maybe once a month   No obvious day to day or daytime variability or assoc excess/ purulent sputum or mucus plugs or hemoptysis or cp or chest tightness, subjective wheeze or overt sinus or hb symptoms. No unusual exp hx or h/o childhood pna/ asthma or knowledge of premature birth.  Sleeping ok without nocturnal  or early am exacerbation  of respiratory  c/o's or need for  noct saba. Also denies any obvious fluctuation of symptoms with weather or environmental changes or other aggravating or alleviating factors except as outlined above   Current Medications, Allergies, Complete Past Medical History, Past Surgical History, Family History, and Social History were reviewed in [employer] record.  ROS  The following are not active complaints unless bolded sore throat, dysphagia, dental problems, itching, sneezing,  nasal congestion or excess/ purulent secretions, ear ache,   fever, chills, sweats, unintended wt loss, classically pleuritic or exertional cp,  orthopnea pnd or leg swelling, presyncope, palpitations, abdominal pain, anorexia, nausea, vomiting, diarrhea  or change in bowel or bladder habits, change in stools or urine, dysuria,hematuria,  rash, arthralgias, visual complaints, headache, numbness, weakness or ataxia or problems with walking or coordination,  change in mood/affect or memory.             Past Medical History:  ASTHMA--chronic mild ...........................................................................Marland KitchenWert  - h/o status asthmaticus/ vent dep 1993 GERD (ICD-530.81)--prev. endo 10/2002--GERD/esophageal stricture HIATAL HERNIA (ICD-553.3)  MITRAL VALVE PROLAPSE (ICD-424.0)  a. echo 4/08: EF 60%. minimal prolapse of posterior leaflet. trivial MR  b. ECH0 4/10: EF 55-60%. mild to moderate MVP with mild MR  Palpitations thought due to SVT  RENAL CALCULUS, HX OF (ICD-V13.01)  HYPERTENSION (ICD-401.9)  Objective:   Physical Exam   Very healthy appearing amb  wf   Wt 135 04/12/2011 > 129 05/03/12 > 05/15/2013  131 > 05/24/2014  138>143 07/22/2015 > 01/26/2016 152   HEENT: nl dentition, turbinates, and orophanx. EAC clear . TM nml      NECK :  without JVD/Nodes/TM/    LUNGS: no acc muscle use, clear to A and P bilaterally without cough on insp or exp maneuvers   CV:  RRR  no s3 or murmur or increase in  P2, no edema   ABD:  soft and nontender   MS: nl gait,  without deformities, calf tenderness, cyanosis or clubbing           Assessment:

## 2016-01-26 NOTE — Patient Instructions (Addendum)
Plan A = Automatic =  qvar 80 Take 2 puffs first thing in am and then another 2 puffs about 12 hours later if not feeling 100%     Plan B = Backup Only use your albuterol as a rescue medication to be used if you can't catch your breath by resting or doing a relaxed purse lip breathing pattern.  - The less you use it, the better it will work when you need it. - Ok to use the inhaler up to 2 puffs  every 4 hours if you must but call for appointment if use goes up over your usual need - Don't leave home without it !!  (think of it like the spare tire for your car)    Please schedule a follow up visit in 12 months but call sooner if needed

## 2016-01-26 NOTE — Assessment & Plan Note (Signed)
-  HFA 90% p coaching 12/18/2011  - PFT's [facility] 07/04/13  FEV1  2.12 (82%) ratio 69 - FENO 01/26/2016  =   27 on qvar 80 2 q am    All goals of chronic asthma control met including optimal function and elimination of symptoms with minimal need for rescue therapy.  Contingencies discussed in full including contacting this office immediately if not controlling the symptoms using the rule of two's.     Each maintenance medication was reviewed in detail including most importantly the difference between maintenance and as needed and under what circumstances the prns are to be used.  Please see instructions for details which were reviewed in writing and the patient given a copy.   

## 2016-06-17 ENCOUNTER — Ambulatory Visit (HOSPITAL_COMMUNITY)
Admission: EM | Admit: 2016-06-17 | Discharge: 2016-06-17 | Disposition: A | Discharge status: Home / Self Care | Payer: 59 | Attending: Emergency Medicine | Admitting: Emergency Medicine

## 2016-06-17 ENCOUNTER — Encounter (HOSPITAL_COMMUNITY): Payer: Self-pay | Admitting: Family Medicine

## 2016-06-17 DIAGNOSIS — N390 Urinary tract infection, site not specified: Secondary | ICD-10-CM | POA: Insufficient documentation

## 2016-06-17 DIAGNOSIS — R102 Pelvic and perineal pain: Secondary | ICD-10-CM | POA: Diagnosis not present

## 2016-06-17 DIAGNOSIS — R109 Unspecified abdominal pain: Secondary | ICD-10-CM | POA: Diagnosis present

## 2016-06-17 DIAGNOSIS — R319 Hematuria, unspecified: Secondary | ICD-10-CM | POA: Diagnosis not present

## 2016-06-17 LAB — POCT URINALYSIS DIP (DEVICE)
GLUCOSE, UA: NEGATIVE mg/dL
Nitrite: NEGATIVE
PROTEIN: NEGATIVE mg/dL
SPECIFIC GRAVITY, URINE: 1.025 (ref 1.005–1.030)
Urobilinogen, UA: 0.2 mg/dL (ref 0.0–1.0)
pH: 6 (ref 5.0–8.0)

## 2016-06-17 MED ORDER — NITROFURANTOIN MONOHYD MACRO 100 MG PO CAPS: 100.0000 mg | ORAL_CAPSULE | Freq: Two times a day (BID) | ORAL | 0 refills | Status: DC

## 2016-06-17 MED ORDER — IBUPROFEN 600 MG PO TABS: 600.0000 mg | ORAL_TABLET | Freq: Four times a day (QID) | ORAL | 0 refills | Status: DC | PRN

## 2016-06-17 NOTE — Discharge Instructions (Signed)
600 mg of ibuprofen with 1 g of Tylenol 4 times a day as needed for pain. This is an effective combination for pain. Follow-up with your primary care physician in several days if you are not getting better, go immediately to the ER if your pain changes, gets worse, if you have fevers despite being on the antibiotics. Give [place] a working phone number so that we can contact you if we need to change your medications.

## 2016-06-17 NOTE — ED Provider Notes (Signed)
HPI  SUBJECTIVE: Angela [name] is a 61 y.o. female who presents with 4-5 days of crampy, dull, achy bilateral low abdominal pain that she describes as intermittent, has now become constant starting yesterday. She reports fevers Tmax 100.0 and chills starting yesterday. She states that it radiated to her back yesterday, but this has since resolved. She is not having any other back pain. Symptoms are worse with flexing her abdominal muscles, no alleviating factors. She has not tried anything for this. It is not related to eating, drinking, fasting, urination, defecation. She denies any urinary symptoms. No gynecological complaints including vaginal discharge, rash, odor. She has not had intercourse in several months. She is in a long-term monogamous relationship with her husband, who is asymptomatic. STDs are not a concern today. She had a normal bowel movement this morning. No abdominal distention, melena, hematochezia, anorexia. She has never had symptoms like this before. Past medical history includes SVT, nonobstructing nephrolithiasis, hiatal hernia, diverticulosis found on colonoscopy. No history of abdominal surgeries, diverticulitis, diabetes, hypertension, PID, STDs, Trichomonas, yeast, BV. She is on estrogen for osteopenia. No history of ovarian cysts per PCO S. [id number] Melvyn Novas, MD    . Diverticulosis   . Elevated C-reactive protein (CRP)   . Esophageal stricture   . GERD (gastroesophageal reflux disease)   . HH (hiatus hernia)   . Hypertension   . MVP (mitral valve prolapse)   . Renal calculus     History reviewed. No pertinent surgical history.  Family History  Problem Relation Age of Onset  . Breast cancer Maternal Aunt     x 2  . Heart disease Maternal Uncle     multiple  . Heart attack Father   . Anuerysm Mother     aortal    Social History  Substance Use Topics  . Smoking status: Never Smoker  . Smokeless tobacco: Never Used  . Alcohol use Yes     Comment: 3 times a  week    No current facility-administered medications for this encounter.   Current Outpatient Prescriptions:  .  albuterol (VENTOLIN HFA) 108 (90 Base) MCG/ACT inhaler, 2 puffs every 4 hours if needed, Disp: 18 g, Rfl: 5 .  beclomethasone (QVAR) 80 MCG/ACT inhaler, inhale 2 puffs by mouth once daily, Disp: 8.7 g, Rfl: 11 .  Ergocalciferol (VITAMIN D2) 400 units TABS, Take 1 tablet by mouth daily., Disp: , Rfl:  .  estradiol (EVAMIST) 1.53 MG/SPRAY transdermal spray, Place 2 sprays onto the skin daily., Disp: , Rfl:  .  Multiple Vitamins-Minerals (CENTRUM SILVER PO), Take 1 tablet by mouth daily., Disp: , Rfl:  .  Omega-3 Fatty Acids (FISH OIL PO), Take 1,800 mg by mouth daily., Disp: , Rfl:  .  Progesterone Micronized (PROMETRIUM PO), Take 1 capsule by mouth daily.  , Disp: , Rfl:   Allergies  Allergen Reactions  . Codeine Nausea And Vomiting     ROS  As noted in HPI.   Physical Exam  BP 158/96 (BP Location: Right Arm)   Pulse 102   Temp 98.6 F (37 C) (Oral)   Resp 16   SpO2 97%   Constitutional: Well developed, well nourished, no acute distress Eyes:  EOMI, conjunctiva normal bilaterally HENT: Normocephalic, atraumatic,mucus membranes moist Respiratory: Normal inspiratory effort Cardiovascular: Normal rate GI: nondistended normal appearance, flat, soft. Diffuse mild tenderness over entire lower abdomen. Negative Murphy, negative McBurney, negative Rovsing. No guarding, rebound, palpable masses. Active bowel sounds. No flankTenderness.  Back: No CVA tenderness  Pelvic, normal external genitalia, non-parous os. Positive non-odorous white vaginal discharge. No trauma, no rash or other lesions. No CMT, no adnexal masses or tenderness. Chaperone present during exam.  skin: No rash, skin intact Musculoskeletal: no deformities Neurologic: Alert & oriented x 3, no focal neuro deficits Psychiatric: Speech and behavior appropriate   ED Course   Medications - No data to  display  No orders of the defined types were placed in this encounter.   No results found for this or any previous visit (from the past 24 hour(s)). No results found.  ED Clinical Impression   Pelvic pain UTI and hematuria, site unspecified  ED Assessment/Plan  [facility] narcotic database reviewed. No opiate prescriptions in the past 6 months.  Urine is suggestive of urinary tract infection, which could be causing her pain. It does not appear to be a gynecological emergency, PID, or surgical abdomen on exam here today. We'll send urine off for culture to confirm antibiotic choice. We'll send home with Macrobid. Also ibuprofen 600 mg 4 times a day 1 g of Tylenol.. Awaiting wet prep results prior to initiating treatment. Did not send off a GC as patient is extremely low risk for this.  Gave patient very strict ER return precautions. She will otherwise follow up with her primary care physician.  Discussed labs, MDM, plan and followup with patient. Discussed sn/sx that should prompt return to the ED. Patient agrees with plan.   No orders of the defined types were placed in this encounter.   *This clinic note was created using Dragon dictation software. Therefore, there may be occasional mistakes despite careful proofreading.  ?   Melynda Ripple, MD 06/17/16 1216

## 2016-06-17 NOTE — ED Triage Notes (Signed)
Pt here for lower abd pain x 4 days that has been constant. sts some radiation to back and more at night. Denies any N,V,D. Denies any urinary symptoms. sts chills yesterday and slight fever.

## 2016-06-18 LAB — URINE CULTURE

## 2016-06-18 LAB — CERVICOVAGINAL ANCILLARY ONLY: Wet Prep (BD Affirm): NEGATIVE

## 2016-08-12 DIAGNOSIS — H10411 Chronic giant papillary conjunctivitis, right eye: Secondary | ICD-10-CM | POA: Diagnosis not present

## 2016-09-07 DIAGNOSIS — Z13220 Encounter for screening for lipoid disorders: Secondary | ICD-10-CM | POA: Diagnosis not present

## 2016-09-07 DIAGNOSIS — Z0000 Encounter for general adult medical examination without abnormal findings: Secondary | ICD-10-CM | POA: Diagnosis not present

## 2016-09-07 DIAGNOSIS — Z01419 Encounter for gynecological examination (general) (routine) without abnormal findings: Secondary | ICD-10-CM | POA: Diagnosis not present

## 2016-09-07 DIAGNOSIS — Z1321 Encounter for screening for nutritional disorder: Secondary | ICD-10-CM | POA: Diagnosis not present

## 2016-10-01 ENCOUNTER — Other Ambulatory Visit: Payer: Self-pay | Admitting: Obstetrics and Gynecology

## 2016-10-01 DIAGNOSIS — Z780 Asymptomatic menopausal state: Secondary | ICD-10-CM

## 2016-10-01 DIAGNOSIS — M810 Age-related osteoporosis without current pathological fracture: Secondary | ICD-10-CM

## 2016-11-09 ENCOUNTER — Other Ambulatory Visit: Payer: Self-pay | Admitting: Obstetrics and Gynecology

## 2016-11-09 DIAGNOSIS — Z1231 Encounter for screening mammogram for malignant neoplasm of breast: Secondary | ICD-10-CM

## 2016-11-11 ENCOUNTER — Encounter: Payer: Self-pay | Admitting: Internal Medicine

## 2016-11-11 ENCOUNTER — Ambulatory Visit (INDEPENDENT_AMBULATORY_CARE_PROVIDER_SITE_OTHER): Payer: 59 | Admitting: Internal Medicine

## 2016-11-11 VITALS — BP 136/88 | HR 110 | Ht 64.0 in | Wt 152.0 lb

## 2016-11-11 DIAGNOSIS — J4530 Mild persistent asthma, uncomplicated: Secondary | ICD-10-CM | POA: Diagnosis not present

## 2016-11-11 DIAGNOSIS — J069 Acute upper respiratory infection, unspecified: Secondary | ICD-10-CM | POA: Diagnosis not present

## 2016-11-11 LAB — NITRIC OXIDE: Nitric Oxide: 38

## 2016-11-11 MED ORDER — AZITHROMYCIN 250 MG PO TABS: ORAL_TABLET | ORAL | 0 refills | Status: DC

## 2016-11-11 MED ORDER — BECLOMETHASONE DIPROP HFA 80 MCG/ACT IN AERB: 2.0000 | INHALATION_SPRAY | Freq: Two times a day (BID) | RESPIRATORY_TRACT | 0 refills | Status: DC

## 2016-11-11 MED ORDER — PREDNISONE 10 MG PO TABS: ORAL_TABLET | ORAL | 0 refills | Status: DC

## 2016-11-11 MED ORDER — OXYCODONE HCL 5 MG PO TABS: 5.0000 mg | ORAL_TABLET | ORAL | 0 refills | Status: DC | PRN

## 2016-11-11 NOTE — Assessment & Plan Note (Addendum)
Complicated by severe cough  Of the three most common causes of  Sub-acute or recurrent or chronic cough, only one (GERD)  can actually contribute to/ trigger  the other two (asthma and post nasal drip syndrome)  and perpetuate the cylce of cough.  While not intuitively obvious, many patients with chronic low grade reflux do not cough until there is a primary insult that disturbs the protective epithelial barrier and exposes sensitive nerve endings.   This is typically viral as is the case here but can be direct physical injury such as with an endotracheal tube.   The point is that once this occurs, it is difficult to eliminate the cycle  using anything but a maximally effective acid suppression regimen at least in the short run, accompanied by an appropriate diet to address non acid GERD and control / eliminate the cough itself for at least 3 days.   rec max gerd rx/ diet and roxicodone for cough suppression then f/u prn   I had an extended discussion with the patient reviewing all relevant studies completed to date and  lasting 15 to 20 minutes of a 25 minute acute visit    Each maintenance medication was reviewed in detail including most importantly the difference between maintenance and prns and under what circumstances the prns are to be triggered using an action plan format that is not reflected in the computer generated alphabetically organized AVS.    Please see AVS for specific instructions unique to this visit that I personally wrote and verbalized to the the pt in detail and then reviewed with pt  by my nurse highlighting any  changes in therapy recommended at today's visit to their plan of care.

## 2016-11-11 NOTE — Progress Notes (Signed)
Subjective:     Patient ID: Angela [name] [facility], female   DOB: 12-26-1955    MRN: [id number]    Brief patient profile:  61  yowf never smoker history of chronic asthma as child, then recurred age 61 with one episode life threatening exacerbation and admit to ICU [facility] in 61 and on maint rx ever since with ICS.    History of Present Illness  01/26/2016  f/u ov/Angela [name] re: chronic asthma  Chief Complaint  Patient presents with  . Follow-up    6 month rov.  needs qvar refill.  denies any complaints today.     some symptoms in [facility]  Heat this summer and with and occ heavy exertion/ chest  Tight always better with saba avg use of saba maybe once a month  rec Plan A = Automatic =  qvar 80 Take 2 puffs first thing in am and then another 2 puffs about 12 hours later if not feeling 100%  Plan B = Backup Only use your albuterol as a rescue medication     11/11/2016  f/u ov/Angela [name] re: cough flare  Chief Complaint  Patient presents with  . Acute Visit    Pt c/o prod cough - pt unsure of color x 2 weeks. Pt denies SOB, CP/tightness, f/c/s.   as instructed qvar 80 2bid and ranitidine once daily but now on estrogen/ progesterone for bone loss issues  Severe coughing fits hourly, worse with voice use, cold liquids/ but occur 24/7 but no sob or increased saba need   No obvious day to day or daytime variability or assoc excess/ purulent sputum or mucus plugs or hemoptysis or cp or chest tightness, subjective wheeze or overt sinus or hb symptoms. No unusual exp hx or h/o childhood pna/ asthma or knowledge of premature birth.  Sleeping ok without nocturnal  or early am exacerbation  of respiratory  c/o's or need for noct saba. Also denies any obvious fluctuation of symptoms with weather or environmental changes or other aggravating or alleviating factors except as outlined above   Current Medications, Allergies, Complete Past Medical History, Past Surgical History, Family History, and Social History  were reviewed in [employer] record.  ROS  The following are not active complaints unless bolded sore throat, dysphagia, dental problems, itching, sneezing,  nasal congestion or excess/ purulent secretions, ear ache,   fever, chills, sweats, unintended wt loss, classically pleuritic or exertional cp,  orthopnea pnd or leg swelling, presyncope, palpitations, abdominal pain, anorexia, nausea, vomiting, diarrhea  or change in bowel or bladder habits, change in stools or urine, dysuria,hematuria,  rash, arthralgias, visual complaints, headache, numbness, weakness or ataxia or problems with walking or coordination,  change in mood/affect or memory.                  Past Medical History:  ASTHMA--chronic mild ...........................................................................Marland KitchenWert  - h/o status asthmaticus/ vent dep 1993 GERD (ICD-530.81)--prev. endo 10/2002--GERD/esophageal stricture HIATAL HERNIA (ICD-553.3)  MITRAL VALVE PROLAPSE (ICD-424.0)  a. echo 4/08: EF 60%. minimal prolapse of posterior leaflet. trivial MR  b. ECH0 4/10: EF 55-60%. mild to moderate MVP with mild MR  Palpitations thought due to SVT  RENAL CALCULUS, HX OF (ICD-V13.01)  HYPERTENSION (ICD-401.9)          Objective:   Physical Exam   Very healthy appearing amb  wf   Vital signs reviewed  - Note on arrival 02 sats 97% on RA     Wt 135 04/12/2011 > 129 05/03/12 >  05/15/2013  131 > 05/24/2014  138>143 07/22/2015 > 01/26/2016 152  >  11/11/2016  152   HEENT: nl dentition, turbinates, and oropharynx with MP pnd.  EAC clear . TM nml     NECK :  without JVD/Nodes/TM/    LUNGS: no acc muscle use, very minimal rhonchi bilaterally, mostly upper airway transmitted sounds    CV:  RRR  no s3 or murmur or increase in P2, no edema   ABD:  soft and nontender   MS: nl gait,  without deformities, calf tenderness, cyanosis or clubbing           Assessment:

## 2016-11-11 NOTE — Patient Instructions (Addendum)
zpak   Prednisone 10 mg take  4 each am x 2 days,   2 each am x 2 days,  1 each am x 2 days and stop   Take delsym two tsp every 12 hours and supplement if needed with  Roxicodone 5mg   every 4 hours to suppress the urge to cough. Swallowing water or using ice chips/non mint and menthol containing candies (such as lifesavers or sugarless jolly ranchers) are also effective.  You should rest your voice and avoid activities that you know make you cough.  Once you have eliminated the cough for 3 straight days Roxicodone first ,  then the delsym as tolerated.   Try prilosec otc 20mg   Take 30-60 min before first meal of the day and Pepcid ac (famotidine) 20 mg one @  bedtime until cough is completely gone for at least a week without the need for cough suppression     GERD (REFLUX)  is an extremely common cause of respiratory symptoms just like yours , many times with no obvious heartburn at all.    It can be treated with medication, but also with lifestyle changes including elevation of the head of your bed (ideally with 6 inch  bed blocks),  Smoking cessation, avoidance of late meals, excessive alcohol, and avoid fatty foods, chocolate, peppermint, colas, red wine, and acidic juices such as orange juice.  NO MINT OR MENTHOL PRODUCTS SO NO COUGH DROPS   USE SUGARLESS CANDY INSTEAD (Jolley ranchers or Stover's or Life Savers) or even ice chips will also do - the key is to swallow to prevent all throat clearing. NO OIL BASED VITAMINS - use powdered substitutes.    Please schedule a follow up visit in 12 months but call sooner if needed

## 2016-11-11 NOTE — Assessment & Plan Note (Signed)
-   HFA 90% p coaching 12/18/2011  - PFT's [facility] 07/04/13  FEV1  2.12 (82%) ratio 69 - FENO 01/26/2016  =   27 on qvar 80 2 q am  - FENO 11/11/2016  =   38 on qvar 2 bid during flare > rec Prednisone 10 mg take  4 each am x 2 days,   2 each am x 2 days,  1 each am x 2 days and stop and no change in qvar  - 11/11/2016  After extensive coaching HFA effectiveness =    90% / new qvar device reviewed   Acute flare of cough in setting of uri with minimal asthma flare but could be cough variant asthma equivalent so rec Prednisone 10 mg take  4 each am x 2 days,   2 each am x 2 days,  1 each am x 2 days and stop   Zpak

## 2016-12-06 ENCOUNTER — Ambulatory Visit
Admission: RE | Admit: 2016-12-06 | Discharge: 2016-12-06 | Disposition: A | Discharge status: Home / Self Care | Payer: 59 | Source: Ambulatory Visit | Attending: Obstetrics and Gynecology | Admitting: Obstetrics and Gynecology

## 2016-12-06 DIAGNOSIS — Z1231 Encounter for screening mammogram for malignant neoplasm of breast: Secondary | ICD-10-CM

## 2016-12-06 DIAGNOSIS — M810 Age-related osteoporosis without current pathological fracture: Secondary | ICD-10-CM

## 2016-12-06 DIAGNOSIS — Z780 Asymptomatic menopausal state: Secondary | ICD-10-CM

## 2016-12-26 ENCOUNTER — Encounter: Payer: Self-pay | Admitting: Internal Medicine

## 2016-12-27 MED ORDER — BECLOMETHASONE DIPROP HFA 80 MCG/ACT IN AERB: 2.0000 | INHALATION_SPRAY | Freq: Two times a day (BID) | RESPIRATORY_TRACT | 5 refills | Status: DC

## 2017-01-05 DIAGNOSIS — D1801 Hemangioma of skin and subcutaneous tissue: Secondary | ICD-10-CM | POA: Diagnosis not present

## 2017-01-05 DIAGNOSIS — L821 Other seborrheic keratosis: Secondary | ICD-10-CM | POA: Diagnosis not present

## 2017-02-21 ENCOUNTER — Encounter: Payer: Self-pay | Admitting: Internal Medicine

## 2017-02-21 ENCOUNTER — Ambulatory Visit (INDEPENDENT_AMBULATORY_CARE_PROVIDER_SITE_OTHER): Payer: 59 | Admitting: Internal Medicine

## 2017-02-21 VITALS — BP 138/90 | HR 118 | Ht 64.0 in | Wt 147.2 lb

## 2017-02-21 DIAGNOSIS — I10 Essential (primary) hypertension: Secondary | ICD-10-CM

## 2017-02-21 DIAGNOSIS — J4530 Mild persistent asthma, uncomplicated: Secondary | ICD-10-CM | POA: Diagnosis not present

## 2017-02-21 MED ORDER — BECLOMETHASONE DIPROPIONATE 80 MCG/ACT IN AERS: 2.0000 | INHALATION_SPRAY | Freq: Two times a day (BID) | RESPIRATORY_TRACT | 0 refills | Status: DC

## 2017-02-21 MED ORDER — TRIAMTERENE-HCTZ 37.5-25 MG PO TABS: 1.0000 | ORAL_TABLET | Freq: Every day | ORAL | 11 refills | Status: DC

## 2017-02-21 NOTE — Patient Instructions (Addendum)
Work on maintaining perfect  inhaler technique:  relax and gently blow all the way out then take a nice smooth deep breath back in, triggering the inhaler at same time you start breathing in.  Hold for up to 5 seconds if you can. Blow out thru nose. Rinse and gargle with water when done  Maxzide 25 one each am should improve both your blood pressure and your calcium balance  Please see patient coordinator before you leave today  to schedule Internal medicine   Please schedule a follow up visit in 12 months but call sooner if needed

## 2017-02-21 NOTE — Progress Notes (Signed)
Subjective:     Patient ID: Angela [name], female   DOB: Nov 28, 1955    MRN: [id number]    Brief patient profile:  61  yowf never smoker history of chronic asthma as child, then recurred age 61 with one episode life threatening exacerbation and admit to ICU [facility] in 1993 and on maint rx ever since with [facility].    History of Present Illness  01/26/2016  f/u ov/Angela [name] re: chronic asthma  Chief Complaint  Patient presents with  . Follow-up    6 month rov.  needs qvar refill.  denies any complaints today.     some symptoms in [name]  Heat this summer and with and occ heavy exertion/ chest  Tight always better with saba avg use of saba maybe once a month  rec Plan A = Automatic =  qvar 80 Take 2 puffs first thing in am and then another 2 puffs about 12 hours later if not feeling 100%  Plan B = Backup Only use your albuterol as a rescue medication     11/11/2016  f/u ov/Angela [name] re: cough flare  Chief Complaint  Patient presents with  . Acute Visit    Pt c/o prod cough - pt unsure of color x 2 weeks. Pt denies SOB, CP/tightness, f/c/s.   as instructed qvar 80 2bid and ranitidine once daily but now on estrogen/ progesterone for bone loss issues  Severe coughing fits hourly, worse with voice use, cold liquids/ but occur 24/7 but no sob or increased saba need  rec zpak  Prednisone 10 mg take  4 each am x 2 days,   2 each am x 2 days,  1 each am x 2 days and stop  Take delsym two tsp every 12 hours and supplement if needed with  Roxicodone 5mg   every 4 hours Try prilosec otc 20mg   Take 30-60 min before first meal of the day and Pepcid ac (famotidine) 20 mg one @  bedtime until cough is completely gone for at least a week without the need for cough suppression GERD (REFLUX)    02/21/2017  f/u ov/Angela [name] re: chronic asthma/ hbp on qvar 80  2 each am  Chief Complaint  Patient presents with  . Follow-up    Pt concerned with blood pressure. Overall feeling well, stable with asthma.   needs hfa  once a month at most, rarely needs to take more than 2 puffs of qvar 80 in am but not comfortable with device yet    No obvious day to day or daytime variability or assoc excess/ purulent sputum or mucus plugs or hemoptysis or cp or chest tightness, subjective wheeze or overt sinus or hb symptoms. No unusual exp hx or h/o childhood pna/ asthma or knowledge of premature birth.  Sleeping ok without nocturnal  or early am exacerbation  of respiratory  c/o's or need for noct saba. Also denies any obvious fluctuation of symptoms with weather or environmental changes or other aggravating or alleviating factors except as outlined above   Current Medications, Allergies, Complete Past Medical History, Past Surgical History, Family History, and Social History were reviewed in [employer] record.  ROS  The following are not active complaints unless bolded sore throat, dysphagia, dental problems, itching, sneezing,  nasal congestion or excess/ purulent secretions, ear ache,   fever, chills, sweats, unintended wt loss, classically pleuritic or exertional cp,  orthopnea pnd or leg swelling, presyncope, palpitations, abdominal pain, anorexia, nausea, vomiting, diarrhea  or change in  bowel or bladder habits, change in stools or urine, dysuria,hematuria,  rash, arthralgias, visual complaints, headache, numbness, weakness or ataxia or problems with walking or coordination,  change in mood/affect or memory.                    Past Medical History:  ASTHMA--chronic mild ...........................................................................Marland KitchenWert  - h/o status asthmaticus/ vent dep 1993 GERD (ICD-530.81)--prev. endo 10/2002--GERD/esophageal stricture HIATAL HERNIA (ICD-553.3)  MITRAL VALVE PROLAPSE (ICD-424.0)  a. echo 4/08: EF 60%. minimal prolapse of posterior leaflet. trivial MR  b. ECH0 4/10: EF 55-60%. mild to moderate MVP with mild MR  Palpitations thought due to SVT  RENAL  CALCULUS, HX OF (ICD-V13.01)  HYPERTENSION (ICD-401.9)          Objective:   Physical Exam   Very healthy appearing amb  wf   Vital signs reviewed  - Note on arrival 02 sats 98% on RA   And BP  138/90   Wt 135 04/12/2011 > 129 05/03/12 > 05/15/2013  131 > 05/24/2014  138>143 07/22/2015 > 01/26/2016 152  >  11/11/2016  152 > 02/21/2017 147   HEENT: nl dentition, turbinates, and oropharynx pristine EAC clear . TM nml     NECK :  without JVD/Nodes/TM/    LUNGS: no acc muscle use, completely clear to A and P    CV:  RRR  no s3 or murmur or increase in P2, no edema   ABD:  soft and nontender   MS: nl gait,  without deformities, calf tenderness, cyanosis or clubbing           Assessment:

## 2017-02-21 NOTE — Assessment & Plan Note (Addendum)
02/21/2017 rx maxzide 25 each am and referred to IM / Shelba Flake   Reviewed benefits (calcium balance/ reduce bp) and risk (rash with sun exp/ gout) >  Agrees to trial of maxzide 25 and f/u IM

## 2017-02-21 NOTE — Assessment & Plan Note (Signed)
-  PFT's [facility] 07/04/13  FEV1  2.12 (82%) ratio 69 - FENO 01/26/2016  =   27 on qvar 80 2 q am  - FENO 11/11/2016  =   38 on qvar 2 bid during flare > rec Prednisone 10 mg take  4 each am x 2 days,   2 each am x 2 days,  1 each am x 2 days and stop and no change in qvar  - 02/21/2017  After extensive coaching device  effectiveness =    100% with redihaler from baseline 75%   All goals of chronic asthma control met including optimal function and elimination of symptoms with minimal need for rescue therapy.  Contingencies discussed in full including contacting this office immediately if not controlling the symptoms using the rule of two's.     I had an extended discussion with the patient reviewing all relevant studies completed to date and  lasting 15 to 20 minutes of a 25 minute visit    Each maintenance medication was reviewed in detail including most importantly the difference between maintenance and prns and under what circumstances the prns are to be triggered using an action plan format that is not reflected in the computer generated alphabetically organized AVS.    Please see AVS for specific instructions unique to this visit that I personally wrote and verbalized to the the pt in detail and then reviewed with pt  by my nurse highlighting any  changes in therapy recommended at today's visit to their plan of care.    [name]

## 2017-03-03 ENCOUNTER — Encounter: Payer: Self-pay | Admitting: Family Medicine

## 2017-03-03 ENCOUNTER — Ambulatory Visit (INDEPENDENT_AMBULATORY_CARE_PROVIDER_SITE_OTHER): Payer: 59 | Admitting: Family Medicine

## 2017-03-03 VITALS — BP 130/80 | HR 102 | Temp 98.5°F | Ht 63.5 in | Wt 143.2 lb

## 2017-03-03 DIAGNOSIS — I10 Essential (primary) hypertension: Secondary | ICD-10-CM

## 2017-03-03 DIAGNOSIS — J4530 Mild persistent asthma, uncomplicated: Secondary | ICD-10-CM

## 2017-03-03 DIAGNOSIS — Z7689 Persons encountering health services in other specified circumstances: Secondary | ICD-10-CM

## 2017-03-03 DIAGNOSIS — M818 Other osteoporosis without current pathological fracture: Secondary | ICD-10-CM | POA: Diagnosis not present

## 2017-03-03 DIAGNOSIS — I341 Nonrheumatic mitral (valve) prolapse: Secondary | ICD-10-CM | POA: Diagnosis not present

## 2017-03-03 NOTE — Progress Notes (Signed)
Patient presents to clinic today to establish care.  SUBJECTIVE: PMH: Pt is a 61 yo CF with pmh sig for asthma, osteoporosis, hypertension, mitral valve prolapse.  Patient has seen several specialists but has never had a PCP.  Asthma: -Patient followed by Dr. Christinia Gully, pulmonology -History of childhood asthma with recurrence at age 25 -Required 1 visit to the ICU -Currently maintained on Qvar and Ventolin prn. -may use Ventolin once every 2 months -Endorses allergy to cats. Denies seasonal triggers. Of note, possible sensitivity to red wine-sulfites trigger asthma.  Osteoporosis: -Status post several bone density scan over the last years -Endorses weightbearing exercise, vitamin D supplementation, multivitamin, estrogen/ progesterone replacement. -Patient notes possible correlation to past PPI use and current condition -No current fractures.  HTN: -recent dx -started on triamterene-HCTZ 37.5-25 by her Pulmonologist x 1 wk ago. -denies headache, blurred vision, changes in vision, lightheadedness. -Checks BP at home.  H/o MVP: -No recent cardiology visits -Denies symptoms.  Allergies: Codeine-vomiting  Surgical history: Right elbow ORIF D&C status post miscarriage R 4th digit with plates and screws s/p fx  Social history: Patient is an Armed forces logistics/support/administrative officer. She is married. Patient denies tobacco and drug use. Patient endorses occasional ethanol use.  Health Maintenance: Mammogram --this year Bone Density -- recent   Past Medical History:  Diagnosis Date  . Asthma   . Diverticulosis   . Elevated C-reactive protein (CRP)   . Esophageal stricture   . GERD (gastroesophageal reflux disease)   . HH (hiatus hernia)   . Hypertension   . MVP (mitral valve prolapse)   . Osteoporosis   . Renal calculus     History reviewed. No pertinent surgical history.  Current Outpatient Prescriptions on File Prior to Visit  Medication Sig Dispense Refill  . albuterol  (VENTOLIN HFA) 108 (90 Base) MCG/ACT inhaler 2 puffs every 4 hours if needed 18 g 5  . beclomethasone (QVAR REDIHALER) 80 MCG/ACT inhaler Inhale 2 puffs into the lungs 2 (two) times daily. 1 Inhaler 5  . Ergocalciferol (VITAMIN D2) 400 units TABS Take 1 tablet by mouth daily.    [name] estradiol (EVAMIST) 1.53 MG/SPRAY transdermal spray Place 2 sprays onto the skin daily.    . Multiple Vitamins-Minerals (CENTRUM SILVER PO) Take 1 tablet by mouth daily.    . Progesterone Micronized (PROMETRIUM PO) Take 1 capsule by mouth daily. 100mg  capsule once daily    . triamterene-hydrochlorothiazide (MAXZIDE-25) 37.5-25 MG tablet Take 1 tablet by mouth daily. 30 tablet 11   No current facility-administered medications on file prior to visit.     Allergies  Allergen Reactions  . Codeine Nausea And Vomiting    Family History  Problem Relation Age of Onset  . Breast cancer Maternal Aunt        x 2  . Heart disease Maternal Uncle        multiple  . Heart attack Father   . Anuerysm Mother        aortal    Social History   Social History  . Marital status: Married    Spouse name: N/A  . Number of children: 0  . Years of education: N/A   Occupational History  . ATTORNEY [employer]   Social History Main Topics  . Smoking status: Never Smoker  . Smokeless tobacco: Never Used  . Alcohol use Yes     Comment: 3 times a week  . Drug use: No  . Sexual activity: Not on file  Other Topics Concern  . Not on file   Social History Narrative  . No narrative on file    ROS General: Denies fever, chills, night sweats, changes in weight, changes in appetite HEENT: Denies headaches, ear pain, changes in vision, rhinorrhea, sore throat CV: Denies CP, palpitations, SOB, orthopnea Pulm: Denies SOB, cough, wheezing GI: Denies abdominal pain, nausea, vomiting, diarrhea, constipation GU: Denies dysuria, hematuria, frequency, vaginal discharge Msk: Denies muscle cramps, joint pains Neuro: Denies  weakness, numbness, tingling Skin: Denies rashes, bruising Psych: Denies depression, anxiety, hallucinations  BP 130/80 (BP Location: Right Arm, Patient Position: Sitting, Cuff Size: Normal)   Pulse (!) 102   Temp 98.5 F (36.9 C) (Oral)   Ht 5' 3.5" (1.613 m)   Wt 143 lb 3.2 oz (65 kg)   BMI 24.97 kg/m   Physical Exam  Gen. Pleasant, well developed, well-nourished, in NAD HEENT - [place]/AT, PERRL, no scleral icterus, no nasal drainage, pharynx without erythema or exudate. Neck: No JVD, no thyromegaly Lungs: no accessory muscle use, CTAB, no wheezes, rales or rhonchi Cardiovascular: RRR, 2/6 murmur, no peripheral edema Abdomen: BS present, soft, nontender,nondistended Musculoskeletal: deformity of R 4th digit-unable to fully flex/extend s/p fx, moves all four extremities, no cyanosis or clubbing, normal tone Neuro:  A&Ox3, CN II-XII intact, normal gait Skin:  Warm, dry, intact, no lesions Psych: normal affect, mood appropriate  No results found for this or any previous visit (from the past 2160 hour(s)).  Assessment/Plan: Essential hypertension -Continue Maxzide 37.5-25mg  -Continue to monitor BP at home. If BP remains elevated after 2 weeks patient to call office for medication adjustment. Patient calls in 2 weeks if symptomatic. -Patient encouraged to decrease sodium intake to 2 g per day. -Lifestyle modifications encouraged. Patient given handout on DASH diet -At next OFV consider BMP.  Mitral valve prolapse  -Patient asymptomatic -2/6 murmur heard on exam -Plan: Ambulatory referral to Cardiology  Other osteoporosis without current pathological fracture -Continue vitamin D supplement, dairy intake, hormonal supplementation -Patient given copy of bone density report from [place]. -Continue weightbearing exercise -Followed by endocrinology  Mild persistent chronic asthma  -Continue daily Qvar and Ventolin prn -Avoid triggers  Encounter to establish care -Records release  form -Can obtain labs at next OFV -Offer influenza vaccine.  Follow-up on BP and 1 month.

## 2017-03-03 NOTE — Patient Instructions (Addendum)
DASH Eating Plan DASH stands for "Dietary Approaches to Stop Hypertension." The DASH eating plan is a healthy eating plan that has been shown to reduce high blood pressure (hypertension). It may also reduce your risk for type 2 diabetes, heart disease, and stroke. The DASH eating plan may also help with weight loss. What are tips for following this plan? General guidelines  Avoid eating more than 2,300 mg (milligrams) of salt (sodium) a day. If you have hypertension, you may need to reduce your sodium intake to 1,500 mg a day.  Limit alcohol intake to no more than 1 drink a day for nonpregnant women and 2 drinks a day for men. One drink equals 12 oz of beer, 5 oz of wine, or 1 oz of hard liquor.  Work with your health care provider to maintain a healthy body weight or to lose weight. Ask what an ideal weight is for you.  Get at least 30 minutes of exercise that causes your heart to beat faster (aerobic exercise) most days of the week. Activities may include walking, swimming, or biking.  Work with your health care provider or diet and nutrition specialist (dietitian) to adjust your eating plan to your individual calorie needs. Reading food labels  Check food labels for the amount of sodium per serving. Choose foods with less than 5 percent of the Daily Value of sodium. Generally, foods with less than 300 mg of sodium per serving fit into this eating plan.  To find whole grains, look for the word "whole" as the first word in the ingredient list. Shopping  Buy products labeled as "low-sodium" or "no salt added."  Buy fresh foods. Avoid canned foods and premade or frozen meals. Cooking  Avoid adding salt when cooking. Use salt-free seasonings or herbs instead of table salt or sea salt. Check with your health care provider or pharmacist before using salt substitutes.  Do not fry foods. Cook foods using healthy methods such as baking, boiling, grilling, and broiling instead.  Cook with  heart-healthy oils, such as olive, canola, soybean, or sunflower oil. Meal planning   Eat a balanced diet that includes: ? 5 or more servings of fruits and vegetables each day. At each meal, try to fill half of your plate with fruits and vegetables. ? Up to 6-8 servings of whole grains each day. ? Less than 6 oz of lean meat, poultry, or fish each day. A 3-oz serving of meat is about the same size as a deck of cards. One egg equals 1 oz. ? 2 servings of low-fat dairy each day. ? A serving of nuts, seeds, or beans 5 times each week. ? Heart-healthy fats. Healthy fats called Omega-3 fatty acids are found in foods such as flaxseeds and coldwater fish, like sardines, salmon, and mackerel.  Limit how much you eat of the following: ? Canned or prepackaged foods. ? Food that is high in trans fat, such as fried foods. ? Food that is high in saturated fat, such as fatty meat. ? Sweets, desserts, sugary drinks, and other foods with added sugar. ? Full-fat dairy products.  Do not salt foods before eating.  Try to eat at least 2 vegetarian meals each week.  Eat more home-cooked food and less restaurant, buffet, and fast food.  When eating at a restaurant, ask that your food be prepared with less salt or no salt, if possible. What foods are recommended? The items listed may not be a complete list. Talk with your dietitian about what   dietary choices are best for you. Grains Whole-grain or whole-wheat bread. Whole-grain or whole-wheat pasta. Brown rice. Oatmeal. Quinoa. Bulgur. Whole-grain and low-sodium cereals. Pita bread. Low-fat, low-sodium crackers. Whole-wheat flour tortillas. Vegetables Fresh or frozen vegetables (raw, steamed, roasted, or grilled). Low-sodium or reduced-sodium tomato and vegetable juice. Low-sodium or reduced-sodium tomato sauce and tomato paste. Low-sodium or reduced-sodium canned vegetables. Fruits All fresh, dried, or frozen fruit. Canned fruit in natural juice (without  added sugar). Meat and other protein foods Skinless chicken or turkey. Ground chicken or turkey. Pork with fat trimmed off. Fish and seafood. Egg whites. Dried beans, peas, or lentils. Unsalted nuts, nut butters, and seeds. Unsalted canned beans. Lean cuts of beef with fat trimmed off. Low-sodium, lean deli meat. Dairy Low-fat (1%) or fat-free (skim) milk. Fat-free, low-fat, or reduced-fat cheeses. Nonfat, low-sodium ricotta or cottage cheese. Low-fat or nonfat yogurt. Low-fat, low-sodium cheese. Fats and oils Soft margarine without trans fats. Vegetable oil. Low-fat, reduced-fat, or light mayonnaise and salad dressings (reduced-sodium). Canola, safflower, olive, soybean, and sunflower oils. Avocado. Seasoning and other foods Herbs. Spices. Seasoning mixes without salt. Unsalted popcorn and pretzels. Fat-free sweets. What foods are not recommended? The items listed may not be a complete list. Talk with your dietitian about what dietary choices are best for you. Grains Baked goods made with fat, such as croissants, muffins, or some breads. Dry pasta or rice meal packs. Vegetables Creamed or fried vegetables. Vegetables in a cheese sauce. Regular canned vegetables (not low-sodium or reduced-sodium). Regular canned tomato sauce and paste (not low-sodium or reduced-sodium). Regular tomato and vegetable juice (not low-sodium or reduced-sodium). Pickles. Olives. Fruits Canned fruit in a light or heavy syrup. Fried fruit. Fruit in cream or butter sauce. Meat and other protein foods Fatty cuts of meat. Ribs. Fried meat. Bacon. Sausage. Bologna and other processed lunch meats. Salami. Fatback. Hotdogs. Bratwurst. Salted nuts and seeds. Canned beans with added salt. Canned or smoked fish. Whole eggs or egg yolks. Chicken or turkey with skin. Dairy Whole or 2% milk, cream, and half-and-half. Whole or full-fat cream cheese. Whole-fat or sweetened yogurt. Full-fat cheese. Nondairy creamers. Whipped toppings.  Processed cheese and cheese spreads. Fats and oils Butter. Stick margarine. Lard. Shortening. Ghee. Bacon fat. Tropical oils, such as coconut, palm kernel, or palm oil. Seasoning and other foods Salted popcorn and pretzels. Onion salt, garlic salt, seasoned salt, table salt, and sea salt. Worcestershire sauce. Tartar sauce. Barbecue sauce. Teriyaki sauce. Soy sauce, including reduced-sodium. Steak sauce. Canned and packaged gravies. Fish sauce. Oyster sauce. Cocktail sauce. Horseradish that you find on the shelf. Ketchup. Mustard. Meat flavorings and tenderizers. Bouillon cubes. Hot sauce and Tabasco sauce. Premade or packaged marinades. Premade or packaged taco seasonings. Relishes. Regular salad dressings. Where to find more information:  National Heart, Lung, and Blood Institute: www.nhlbi.nih.gov  American Heart Association: www.heart.org Summary  The DASH eating plan is a healthy eating plan that has been shown to reduce high blood pressure (hypertension). It may also reduce your risk for type 2 diabetes, heart disease, and stroke.  With the DASH eating plan, you should limit salt (sodium) intake to 2,300 mg a day. If you have hypertension, you may need to reduce your sodium intake to 1,500 mg a day.  When on the DASH eating plan, aim to eat more fresh fruits and vegetables, whole grains, lean proteins, low-fat dairy, and heart-healthy fats.  Work with your health care provider or diet and nutrition specialist (dietitian) to adjust your eating plan to your individual   calorie needs. This information is not intended to replace advice given to you by your health care provider. Make sure you discuss any questions you have with your health care provider. Document Released: 05/20/2011 Document Revised: 05/24/2016 Document Reviewed: 05/24/2016 Elsevier Interactive Patient Education  2017 [place] protect organs, store calcium, and anchor muscles. Good health habits,  such as eating nutritious foods and exercising regularly, are important for maintaining healthy bones. They can also help to prevent a condition that causes bones to lose density and become weak and brittle (osteoporosis). Why is bone mass important? Bone mass refers to the amount of bone tissue that you have. The higher your bone mass, the stronger your bones. An important step toward having healthy bones throughout life is to have strong and dense bones during childhood. A young adult who has a high bone mass is more likely to have a high bone mass later in life. Bone mass at its greatest it is called peak bone mass. A large decline in bone mass occurs in older adults. In women, it occurs about the time of menopause. During this time, it is important to practice good health habits, because if more bone is lost than what is replaced, the bones will become less healthy and more likely to break (fracture). If you find that you have a low bone mass, you may be able to prevent osteoporosis or further bone loss by changing your diet and lifestyle. How can I find out if my bone mass is low? Bone mass can be measured with an X-ray test that is called a bone mineral density (BMD) test. This test is recommended for all women who are age 51 or older. It may also be recommended for men who are age 2 or older, or for people who are more likely to develop osteoporosis due to:  Having bones that break easily.  Having a long-term disease that weakens bones, such as kidney disease or rheumatoid arthritis.  Having menopause earlier than normal.  Taking medicine that weakens bones, such as steroids, thyroid hormones, or hormone treatment for breast cancer or prostate cancer.  Smoking.  Drinking three or more alcoholic drinks each day.  What are the nutritional recommendations for healthy bones? To have healthy bones, you need to get enough of the right minerals and vitamins. Most nutrition experts recommend  getting these nutrients from the foods that you eat. Nutritional recommendations vary from person to person. Ask your health care provider what is healthy for you. Here are some general guidelines. Calcium Recommendations Calcium is the most important (essential) mineral for bone health. Most people can get enough calcium from their diet, but supplements may be recommended for people who are at risk for osteoporosis. Good sources of calcium include:  Dairy products, such as low-fat or nonfat milk, cheese, and yogurt.  Dark green leafy vegetables, such as bok choy and broccoli.  Calcium-fortified foods, such as orange juice, cereal, bread, soy beverages, and tofu products.  Nuts, such as almonds.  Follow these recommended amounts for daily calcium intake:  Children, age 364?3: 700 mg.  Children, age 36?8: 1,000 mg.  Children, age 68?13: 1,300 mg.  Teens, age 36?18: 1,300 mg.  Adults, age 55?50: 1,000 mg.  Adults, age 361?70: ? Men: 1,000 mg. ? Women: 1,200 mg.  Adults, age 84 or older: 1,200 mg.  Pregnant and breastfeeding females: ? Teens: 1,300 mg. ? Adults: 1,000 mg.  Vitamin D Recommendations Vitamin D is the most essential  vitamin for bone health. It helps the body to absorb calcium. Sunlight stimulates the skin to make vitamin D, so be sure to get enough sunlight. If you live in a cold climate or you do not get outside often, your health care provider may recommend that you take vitamin D supplements. Good sources of vitamin D in your diet include:  Egg yolks.  Saltwater fish.  Milk and cereal fortified with vitamin D.  Follow these recommended amounts for daily vitamin D intake:  Children and teens, age 58?18: 33 international units.  Adults, age 25 or younger: 400-800 international units.  Adults, age 66 or older: 800-1,000 international units.  Other Nutrients Other nutrients for bone health include:  Phosphorus. This mineral is found in meat, poultry, dairy  foods, nuts, and legumes. The recommended daily intake for adult men and adult women is 700 mg.  Magnesium. This mineral is found in seeds, nuts, dark green vegetables, and legumes. The recommended daily intake for adult men is 400?420 mg. For adult women, it is 310?320 mg.  Vitamin K. This vitamin is found in green leafy vegetables. The recommended daily intake is 120 mg for adult men and 90 mg for adult women.  What type of physical activity is best for building and maintaining healthy bones? Weight-bearing and strength-building activities are important for building and maintaining peak bone mass. Weight-bearing activities cause muscles and bones to work against gravity. Strength-building activities increases muscle strength that supports bones. Weight-bearing and muscle-building activities include:  Walking and hiking.  Jogging and running.  Dancing.  Gym exercises.  Lifting weights.  Tennis and racquetball.  Climbing stairs.  Aerobics.  Adults should get at least 30 minutes of moderate physical activity on most days. Children should get at least 60 minutes of moderate physical activity on most days. Ask your health care provide what type of exercise is best for you. Where can I find more information? For more information, check out the following websites:  [place]: YardHomes.se  [employer] of Health: http://www.niams.AnonymousEar.fr.asp  This information is not intended to replace advice given to you by your health care provider. Make sure you discuss any questions you have with your health care provider. Document Released: 08/21/2003 Document Revised: 12/19/2015 Document Reviewed: 06/05/2014 Elsevier Interactive Patient Education  2018 [employer].  Managing Your Hypertension Hypertension is commonly called high blood pressure. This is when the force of your blood pressing against  the walls of your arteries is too strong. Arteries are blood vessels that carry blood from your heart throughout your body. Hypertension forces the heart to work harder to pump blood, and may cause the arteries to become narrow or stiff. Having untreated or uncontrolled hypertension can cause heart attack, stroke, kidney disease, and other problems. What are blood pressure readings? A blood pressure reading consists of a higher number over a lower number. Ideally, your blood pressure should be below 120/80. The first ("top") number is called the systolic pressure. It is a measure of the pressure in your arteries as your heart beats. The second ("bottom") number is called the diastolic pressure. It is a measure of the pressure in your arteries as the heart relaxes. What does my blood pressure reading mean? Blood pressure is classified into four stages. Based on your blood pressure reading, your health care provider may use the following stages to determine what type of treatment you need, if any. Systolic pressure and diastolic pressure are measured in a unit called mm Hg. Normal  Systolic pressure: below 875.  Diastolic pressure: below 80. Elevated  Systolic pressure: 643-329.  Diastolic pressure: below 80. Hypertension stage 1  Systolic pressure: 518-841.  Diastolic pressure: 66-06. Hypertension stage 2  Systolic pressure: 301 or above.  Diastolic pressure: 90 or above. What health risks are associated with hypertension? Managing your hypertension is an important responsibility. Uncontrolled hypertension can lead to:  A heart attack.  A stroke.  A weakened blood vessel (aneurysm).  Heart failure.  Kidney damage.  Eye damage.  Metabolic syndrome.  Memory and concentration problems.  What changes can I make to manage my hypertension? Hypertension can be managed by making lifestyle changes and possibly by taking medicines. Your health care provider will help you make a plan  to bring your blood pressure within a normal range. Eating and drinking  Eat a diet that is high in fiber and potassium, and low in salt (sodium), added sugar, and fat. An example eating plan is called the DASH (Dietary Approaches to Stop Hypertension) diet. To eat this way: ? Eat plenty of fresh fruits and vegetables. Try to fill half of your plate at each meal with fruits and vegetables. ? Eat whole grains, such as whole wheat pasta, brown rice, or whole grain bread. Fill about one quarter of your plate with whole grains. ? Eat low-fat diary products. ? Avoid fatty cuts of meat, processed or cured meats, and poultry with skin. Fill about one quarter of your plate with lean proteins such as fish, chicken without skin, beans, eggs, and tofu. ? Avoid premade and processed foods. These tend to be higher in sodium, added sugar, and fat.  Reduce your daily sodium intake. Most people with hypertension should eat less than 1,500 mg of sodium a day.  Limit alcohol intake to no more than 1 drink a day for nonpregnant women and 2 drinks a day for men. One drink equals 12 oz of beer, 5 oz of wine, or 1 oz of hard liquor. Lifestyle  Work with your health care provider to maintain a healthy body weight, or to lose weight. Ask what an ideal weight is for you.  Get at least 30 minutes of exercise that causes your heart to beat faster (aerobic exercise) most days of the week. Activities may include walking, swimming, or biking.  Include exercise to strengthen your muscles (resistance exercise), such as weight lifting, as part of your weekly exercise routine. Try to do these types of exercises for 30 minutes at least 3 days a week.  Do not use any products that contain nicotine or tobacco, such as cigarettes and e-cigarettes. If you need help quitting, ask your health care provider.  Control any long-term (chronic) conditions you have, such as high cholesterol or diabetes. Monitoring  Monitor your blood  pressure at home as told by your health care provider. Your personal target blood pressure may vary depending on your medical conditions, your age, and other factors.  Have your blood pressure checked regularly, as often as told by your health care provider. Working with your health care provider  Review all the medicines you take with your health care provider because there may be side effects or interactions.  Talk with your health care provider about your diet, exercise habits, and other lifestyle factors that may be contributing to hypertension.  Visit your health care provider regularly. Your health care provider can help you create and adjust your plan for managing hypertension. Will I need medicine to control my blood pressure? Your  health care provider may prescribe medicine if lifestyle changes are not enough to get your blood pressure under control, and if:  Your systolic blood pressure is 130 or higher.  Your diastolic blood pressure is 80 or higher.  Take medicines only as told by your health care provider. Follow the directions carefully. Blood pressure medicines must be taken as prescribed. The medicine does not work as well when you skip doses. Skipping doses also puts you at risk for problems. Contact a health care provider if:  You think you are having a reaction to medicines you have taken.  You have repeated (recurrent) headaches.  You feel dizzy.  You have swelling in your ankles.  You have trouble with your vision. Get help right away if:  You develop a severe headache or confusion.  You have unusual weakness or numbness, or you feel faint.  You have severe pain in your chest or abdomen.  You vomit repeatedly.  You have trouble breathing. Summary  Hypertension is when the force of blood pumping through your arteries is too strong. If this condition is not controlled, it may put you at risk for serious complications.  Your personal target blood pressure  may vary depending on your medical conditions, your age, and other factors. For most people, a normal blood pressure is less than 120/80.  Hypertension is managed by lifestyle changes, medicines, or both. Lifestyle changes include weight loss, eating a healthy, low-sodium diet, exercising more, and limiting alcohol. This information is not intended to replace advice given to you by your health care provider. Make sure you discuss any questions you have with your health care provider. Document Released: 02/23/2012 Document Revised: 04/28/2016 Document Reviewed: 04/28/2016 Elsevier Interactive Patient Education  [employer].

## 2017-04-13 ENCOUNTER — Encounter: Payer: Self-pay | Admitting: Interventional Cardiology

## 2017-04-13 ENCOUNTER — Ambulatory Visit (INDEPENDENT_AMBULATORY_CARE_PROVIDER_SITE_OTHER): Payer: 59 | Admitting: Interventional Cardiology

## 2017-04-13 VITALS — BP 144/96 | HR 95 | Ht 64.0 in | Wt 147.4 lb

## 2017-04-13 DIAGNOSIS — E7800 Pure hypercholesterolemia, unspecified: Secondary | ICD-10-CM | POA: Diagnosis not present

## 2017-04-13 DIAGNOSIS — I10 Essential (primary) hypertension: Secondary | ICD-10-CM

## 2017-04-13 DIAGNOSIS — I341 Nonrheumatic mitral (valve) prolapse: Secondary | ICD-10-CM | POA: Diagnosis not present

## 2017-04-13 DIAGNOSIS — R9431 Abnormal electrocardiogram [ECG] [EKG]: Secondary | ICD-10-CM | POA: Diagnosis not present

## 2017-04-13 NOTE — Patient Instructions (Signed)
Medication Instructions:  Your physician recommends that you continue on your current medications as directed. Please refer to the Current Medication list given to you today.  Labwork: BMET today  Testing/Procedures: None  Follow-Up: Your physician recommends that you schedule a follow-up appointment as needed with Dr. Tamala Julian.    Any Other Special Instructions Will Be Listed Below (If Applicable).     If you need a refill on your cardiac medications before your next appointment, please call your pharmacy.

## 2017-04-13 NOTE — Addendum Note (Signed)
Addended by: Eulis Foster on: 04/13/2017 01:56 PM   Modules accepted: Orders

## 2017-04-13 NOTE — Progress Notes (Signed)
Cardiology Office Note    Date:  04/13/2017   ID:  [name], [place] Jun 15, 1955, MRN [id number]  PCP:  Billie Ruddy, MD  Cardiologist: Sinclair Grooms, MD   Chief Complaint  Patient presents with  . Cardiac Valve Problem    History of Present Illness:  Angela [name] is a 61 y.o. female primary care, Billie Ruddy MD.  Reason for referral is elevated blood pressure and history of mitral valve prolapse.  The patient's background history is significant for osteoporosis, asthma, and prior diagnosis of mitral valve prolapse.  Previously seen by Dr. Charlett Blake.  She is here for cardiac evaluation at the request of Dr. Volanda Napoleon.  This is primarily to assess the current status of mitral valve prolapse.  The patient denies edema, orthopnea, chest pain, palpitations, and change in exertional tolerance.  She has a long-standing history of mitral valve prolapse with mild mitral regurgitation documented on multiple echoes most recently in 2015 at [facility] (summarized below).  She was recently diagnosed with elevated blood pressure and started on Maxide.  Exertional tolerance has been stable.  Past Medical History:  Diagnosis Date  . Asthma   . Diverticulosis   . Elevated C-reactive protein (CRP)   . Esophageal stricture   . GERD (gastroesophageal reflux disease)   . HH (hiatus hernia)   . Hypertension   . MVP (mitral valve prolapse)   . Osteoporosis   . Renal calculus     No past surgical history on file.  Current Medications: Outpatient Medications Prior to Visit  Medication Sig Dispense Refill  . albuterol (VENTOLIN HFA) 108 (90 Base) MCG/ACT inhaler 2 puffs every 4 hours if needed 18 g 5  . beclomethasone (QVAR REDIHALER) 80 MCG/ACT inhaler Inhale 2 puffs into the lungs 2 (two) times daily. 1 Inhaler 5  . Ergocalciferol (VITAMIN D2) 400 units TABS Take 1 tablet by mouth daily.    [name] estradiol (EVAMIST) 1.53 MG/SPRAY transdermal spray Place 2  sprays onto the skin daily.    . Multiple Vitamins-Minerals (CENTRUM SILVER PO) Take 1 tablet by mouth daily.    . Progesterone Micronized (PROMETRIUM PO) Take 1 capsule by mouth daily. 173m capsule once daily    . triamterene-hydrochlorothiazide (MAXZIDE-25) 37.5-25 MG tablet Take 1 tablet by mouth daily. 30 tablet 11   No facility-administered medications prior to visit.      Allergies:   Codeine   Social History   Social History  . Marital status: Married    Spouse name: N/A  . Number of children: 0  . 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 Smoking status: Never Smoker  . Smokeless tobacco: Never Used  . Alcohol use Yes     Comment: 3 times a week  . Drug use: No  . Sexual activity: Not Asked   Other Topics Concern  . None   Social History Narrative  . None     Family History:  The patient's family history includes Anuerysm in her mother; Breast cancer in her maternal aunt; Heart attack in her father; Heart disease in her maternal uncle.   ROS:   Please see the history of present illness.    No complaints All other systems reviewed and are negative.   PHYSICAL EXAM:   VS:  BP (!) 144/96 (BP Location: Left Arm)   Pulse 95   Ht 5' 4"  (  1.626 m)   Wt 147 lb 6.4 oz (66.9 kg)   BMI 25.30 kg/m    GEN: Well nourished, well developed, in no acute distress  HEENT: normal  Neck: no JVD, carotid bruits, or masses Cardiac: RRR; rubs, or gallops.  There is a late peaking soft systolic murmur heard at the apex and into the left axilla.  No edema  Respiratory:  clear to auscultation bilaterally, normal work of breathing GI: soft, nontender, nondistended, + BS MS: no deformity or atrophy  Skin: warm and dry, no rash Neuro:  Alert and Oriented x 3, Strength and sensation are intact Psych: euthymic mood, full affect  Wt Readings from Last 3 Encounters:  04/13/17 147 lb 6.4 oz (66.9 kg)  03/03/17 143 lb  3.2 oz (65 kg)  02/21/17 147 lb 3.2 oz (66.8 kg)      Studies/Labs Reviewed:   EKG:  EKG sinus rhythm with nonspecific diffuse ST-T wave abnormality.  Recent Labs: No results found for requested labs within last 8760 hours.   Lipid Panel    Component Value Date/Time   CHOL 234 (H) 10/15/2010 1024   TRIG 47.0 10/15/2010 1024   HDL 89.30 10/15/2010 1024   CHOLHDL 3 10/15/2010 1024   VLDL 9.4 10/15/2010 1024   LDLCALC 117 (H) 10/21/2008 1031   LDLDIRECT 133.1 10/15/2010 1024    Additional studies/ records that were reviewed today include:   Echocardiogram Oct 21, 2008: Study Conclusions   1. Left ventricle: The cavity size was normal. Wall thickness was     normal. Systolic function was normal. The estimated ejection     fraction was in the range of 55% to 65%. Wall motion was normal;     there were no regional wall motion abnormalities.  2. Aortic valve: Trivial regurgitation.  3. Mitral valve: Moderate prolapse, involving the anterior leaflet     and the posterior leaflet. Mild regurgitation.  4. Left atrium: The atrium was mildly dilated.  Echocardiogram 2015 [facility]: INTERPRETATION ---------------------------------------------------------------   NORMAL LEFT VENTRICULAR SYSTOLIC FUNCTION   NORMAL RIGHT VENTRICULAR SYSTOLIC FUNCTION   VALVULAR REGURGITATION: TRIVIAL AR, MILD MR, TRIVIAL PR, TRIVIAL TR   NO VALVULAR STENOSIS   NO PRIOR STUDY FOR COMPARISON  ASSESSMENT:    1. Mitral valve prolapse   2. Essential hypertension   3. Pure hypercholesterolemia   4. Abnormal EKG      PLAN:  In order of problems listed above:  1. Exam confirms mitral regurgitation.  Clinical features do not suggest in the particular hemodynamic disturbance related to this lesion.  We discussed the natural history of mitral regurgitation/prolapse.  We talked about chordal rupture and acute severe mitral regurgitation, propensity to develop atrial arrhythmias, and  symptoms suggestive of developing heart failure such as exertional dyspnea/orthopnea/exertional fatigue.  I do not believe any significant evaluation is needed at this time given exam and prior documentation over the years. 2. Blood pressures mildly elevated today.  EKG reveals diffuse ST abnormality possibly related to the recent addition of Maxide as an antihypertensive.  Will check a be met today and if needed replete her potassium. 3. Last LDL cholesterol on our records was 133 in 2012 and 120 in 2015 at [facility].  Target should be less than 100. 4. EKG appearance suggests hypokalemia.  Basic metabolic panel will be obtained.  No prior tracings are available to compare.    Medication Adjustments/Labs and Tests Ordered: Current medicines are reviewed at length with the  patient today.  Concerns regarding medicines are outlined above.  Medication changes, Labs and Tests ordered today are listed in the Patient Instructions below. Patient Instructions  Medication Instructions:  Your physician recommends that you continue on your current medications as directed. Please refer to the Current Medication list given to you today.  Labwork: BMET today  Testing/Procedures: None  Follow-Up: Your physician recommends that you schedule a follow-up appointment as needed with Dr. Tamala Julian.    Any Other Special Instructions Will Be Listed Below (If Applicable).     If you need a refill on your cardiac medications before your next appointment, please call your pharmacy.      Signed, Sinclair Grooms, MD  04/13/2017 1:53 PM    [facility] [address], [place], [place]  [address] Phone: [phone]; Fax: [phone]

## 2017-04-14 LAB — BASIC METABOLIC PANEL
BUN / CREAT RATIO: 21 (ref 9–23)
BUN: 16 mg/dL (ref 6–24)
CHLORIDE: 99 mmol/L (ref 96–106)
CO2: 24 mmol/L (ref 20–29)
Calcium: 9.8 mg/dL (ref 8.7–10.2)
Creatinine, Ser: 0.76 mg/dL (ref 0.57–1.00)
GFR, EST AFRICAN AMERICAN: 99 mL/min/{1.73_m2} (ref >59)
GFR, EST NON AFRICAN AMERICAN: 86 mL/min/{1.73_m2} (ref >59)
Glucose: 97 mg/dL (ref 65–99)
Potassium: 3.3 mmol/L — ABNORMAL LOW (ref 3.5–5.2)
Sodium: 142 mmol/L (ref 134–144)

## 2017-04-15 ENCOUNTER — Telehealth: Payer: Self-pay | Admitting: Interventional Cardiology

## 2017-04-15 DIAGNOSIS — E876 Hypokalemia: Secondary | ICD-10-CM

## 2017-04-15 MED ORDER — POTASSIUM CHLORIDE CRYS ER 20 MEQ PO TBCR: 20.0000 meq | EXTENDED_RELEASE_TABLET | Freq: Every day | ORAL | 1 refills | Status: DC

## 2017-04-15 NOTE — Telephone Encounter (Signed)
Notes recorded by Belva Crome, MD on 04/14/2017 at 8:09 AM EDT Let the patient know the potassium level is low as suggested by the electrocardiogram. Please start K Dur 20 mEq daily. Repeat basic metabolic panel in 9-39 days. She should probably have her EKG repeated the next time she sees her primary physician or [place] to have a better baseline tracing. We will also forward this information to Dr. Melvyn Novas. A copy will be sent to Billie Ruddy, MD  Spoke with patient, gave lab results and suggestions made per Dr. Tamala Julian.  Start 20 meq potassium, repeat BMET on 11/13, and follow up with PCP to have another EKG done.  Will forward lab results to her PCP.

## 2017-04-15 NOTE — Telephone Encounter (Signed)
Mrs. Stopher is returning a call . Thanks

## 2017-04-26 ENCOUNTER — Other Ambulatory Visit: Payer: 59

## 2017-04-26 DIAGNOSIS — E876 Hypokalemia: Secondary | ICD-10-CM | POA: Diagnosis not present

## 2017-04-26 LAB — BASIC METABOLIC PANEL
BUN/Creatinine Ratio: 16 (ref 9–23)
BUN: 14 mg/dL (ref 6–24)
CO2: 22 mmol/L (ref 20–29)
Calcium: 9.7 mg/dL (ref 8.7–10.2)
Chloride: 100 mmol/L (ref 96–106)
Creatinine, Ser: 0.88 mg/dL (ref 0.57–1.00)
GFR, EST AFRICAN AMERICAN: 83 mL/min/{1.73_m2} (ref >59)
GFR, EST NON AFRICAN AMERICAN: 72 mL/min/{1.73_m2} (ref >59)
Glucose: 92 mg/dL (ref 65–99)
POTASSIUM: 4.1 mmol/L (ref 3.5–5.2)
SODIUM: 139 mmol/L (ref 134–144)

## 2017-04-29 ENCOUNTER — Telehealth: Payer: Self-pay | Admitting: Interventional Cardiology

## 2017-04-29 NOTE — Telephone Encounter (Signed)
New message    Patient calling for lab results. States she saw results on MyCHART. Patient states to leave a detailed message of vcml if she needs to take any action.

## 2017-04-29 NOTE — Telephone Encounter (Signed)
LEFT DETAILED MESSAGE OF LAB RESULTS ON VOICE MAIL AT PT'S REQUEST .Angela [name]

## 2017-08-22 ENCOUNTER — Other Ambulatory Visit: Payer: Self-pay | Admitting: Interventional Cardiology

## 2017-08-22 DIAGNOSIS — E876 Hypokalemia: Secondary | ICD-10-CM

## 2017-10-03 ENCOUNTER — Encounter: Payer: Self-pay | Admitting: Internal Medicine

## 2017-11-08 ENCOUNTER — Other Ambulatory Visit: Payer: Self-pay | Admitting: Obstetrics and Gynecology

## 2017-11-08 DIAGNOSIS — Z1231 Encounter for screening mammogram for malignant neoplasm of breast: Secondary | ICD-10-CM

## 2017-11-30 ENCOUNTER — Other Ambulatory Visit: Payer: Self-pay | Admitting: Internal Medicine

## 2017-12-05 DIAGNOSIS — Z131 Encounter for screening for diabetes mellitus: Secondary | ICD-10-CM | POA: Diagnosis not present

## 2017-12-05 DIAGNOSIS — Z130 Encounter for screening for diseases of the blood and blood-forming organs and certain disorders involving the immune mechanism: Secondary | ICD-10-CM | POA: Diagnosis not present

## 2017-12-05 DIAGNOSIS — Z13220 Encounter for screening for lipoid disorders: Secondary | ICD-10-CM | POA: Diagnosis not present

## 2017-12-05 DIAGNOSIS — Z01419 Encounter for gynecological examination (general) (routine) without abnormal findings: Secondary | ICD-10-CM | POA: Diagnosis not present

## 2017-12-05 DIAGNOSIS — Z0000 Encounter for general adult medical examination without abnormal findings: Secondary | ICD-10-CM | POA: Diagnosis not present

## 2017-12-07 ENCOUNTER — Ambulatory Visit
Admission: RE | Admit: 2017-12-07 | Discharge: 2017-12-07 | Disposition: A | Discharge status: Home / Self Care | Payer: 59 | Source: Ambulatory Visit | Attending: Obstetrics and Gynecology | Admitting: Obstetrics and Gynecology

## 2017-12-07 DIAGNOSIS — Z1231 Encounter for screening mammogram for malignant neoplasm of breast: Secondary | ICD-10-CM | POA: Diagnosis not present

## 2017-12-09 ENCOUNTER — Ambulatory Visit (AMBULATORY_SURGERY_CENTER): Payer: Self-pay

## 2017-12-09 ENCOUNTER — Other Ambulatory Visit: Payer: Self-pay

## 2017-12-09 VITALS — Ht 64.0 in | Wt 146.2 lb

## 2017-12-09 DIAGNOSIS — Z1211 Encounter for screening for malignant neoplasm of colon: Secondary | ICD-10-CM

## 2017-12-09 NOTE — Progress Notes (Signed)
Denies allergies to eggs or soy products. Denies complication of anesthesia or sedation. Denies use of weight loss medication. Denies use of O2.   Emmi instructions declined.  

## 2017-12-12 ENCOUNTER — Encounter: Payer: Self-pay | Admitting: Internal Medicine

## 2017-12-14 ENCOUNTER — Telehealth: Payer: Self-pay

## 2017-12-14 MED ORDER — NA SULFATE-K SULFATE-MG SULF 17.5-3.13-1.6 GM/177ML PO SOLN: ORAL | 0 refills | Status: DC

## 2017-12-14 NOTE — Telephone Encounter (Signed)
Resent Suprep into pharmacy.  Attempted to reach pt to let her know- no answer and mailbox is full; unable to leave a message.  Will try again later.

## 2017-12-16 NOTE — Telephone Encounter (Signed)
Mail box is full and cannot LM-

## 2017-12-16 NOTE — Telephone Encounter (Signed)
Mail box still full and cannot leave a message.  Prep was resent Wednesday by Delos Haring RN.

## 2017-12-16 NOTE — Telephone Encounter (Signed)
Mail box still full and cannot LM

## 2017-12-23 ENCOUNTER — Other Ambulatory Visit: Payer: Self-pay

## 2017-12-23 ENCOUNTER — Ambulatory Visit (AMBULATORY_SURGERY_CENTER): Payer: 59 | Admitting: Internal Medicine

## 2017-12-23 ENCOUNTER — Encounter: Payer: Self-pay | Admitting: Internal Medicine

## 2017-12-23 VITALS — BP 119/77 | HR 68 | Temp 98.6°F | Resp 12 | Ht 64.0 in | Wt 146.0 lb

## 2017-12-23 DIAGNOSIS — Z1211 Encounter for screening for malignant neoplasm of colon: Secondary | ICD-10-CM

## 2017-12-23 MED ORDER — SODIUM CHLORIDE 0.9 % IV SOLN: 500.0000 mL | INTRAVENOUS | Status: DC

## 2017-12-23 NOTE — Progress Notes (Signed)
Pt's states no medical or surgical changes since previsit or office visit. 

## 2017-12-23 NOTE — Op Note (Signed)
Reading Patient Name: Angela [name] [facility] Procedure Date: 12/23/2017 9:39 AM MRN: [id number] Endoscopist: Docia Chuck. Henrene Pastor , MD Age: 62 Referring MD:  Date of Birth: 06-10-1956 Gender: Female Account #: [id number] Procedure:                Colonoscopy Indications:              Screening for colorectal malignant neoplasm.                            Negative index exam June 2009 Medicines:                Monitored Anesthesia Care Procedure:                Pre-Anesthesia Assessment:                           - Prior to the procedure, a History and Physical                            was performed, and patient medications and                            allergies were reviewed. The patient's tolerance of                            previous anesthesia was also reviewed. The risks                            and benefits of the procedure and the sedation                            options and risks were discussed with the patient.                            All questions were answered, and informed consent                            was obtained. Prior Anticoagulants: The patient has                            taken no previous anticoagulant or antiplatelet                            agents. ASA Grade Assessment: II - A patient with                            mild systemic disease. After reviewing the risks                            and benefits, the patient was deemed in                            satisfactory condition to undergo the procedure.  After obtaining informed consent, the colonoscope                            was passed under direct vision. Throughout the                            procedure, the patient's blood pressure, pulse, and                            oxygen saturations were monitored continuously. The                            Model CF-HQ190L [id number]) scope was introduced                            through the anus and advanced to  the the cecum,                            identified by appendiceal orifice and ileocecal                            valve. The ileocecal valve, appendiceal orifice,                            and rectum were photographed. The quality of the                            bowel preparation was excellent. The colonoscopy                            was performed without difficulty. The patient                            tolerated the procedure well. The bowel preparation                            used was SUPREP. Scope In: 9:50:42 AM Scope Out: 10:06:36 AM Scope Withdrawal Time: 0 hours 12 minutes 45 seconds  Total Procedure Duration: 0 hours 15 minutes 54 seconds  Findings:                 Multiple small and large-mouthed diverticula were                            found in the left colon and right colon.                           Internal hemorrhoids were found during                            retroflexion. The hemorrhoids were small.                           The exam was otherwise without abnormality on  direct and retroflexion views. Complications:            No immediate complications. Estimated blood loss:                            None. Estimated Blood Loss:     Estimated blood loss: none. Impression:               - Diverticulosis in the left colon and in the right                            colon.                           - Internal hemorrhoids.                           - The examination was otherwise normal on direct                            and retroflexion views.                           - No specimens collected. Recommendation:           - Repeat colonoscopy in 10 years for screening                            purposes.                           - Patient has a contact number available for                            emergencies. The signs and symptoms of potential                            delayed complications were discussed with the                             patient. Return to normal activities tomorrow.                            Written discharge instructions were provided to the                            patient.                           - Resume previous diet.                           - Continue present medications. Docia Chuck. Henrene Pastor, MD 12/23/2017 10:13:28 AM This report has been signed electronically.

## 2017-12-23 NOTE — Progress Notes (Signed)
A/ox3 pleased with MAC, report to RN 

## 2017-12-23 NOTE — Patient Instructions (Signed)
YOU HAD AN ENDOSCOPIC PROCEDURE TODAY AT [facility]:   Refer to the procedure report that was given to you for any specific questions about what was found during the examination.  If the procedure report does not answer your questions, please call your gastroenterologist to clarify.  If you requested that your care partner not be given the details of your procedure findings, then the procedure report has been included in a sealed envelope for you to review at your convenience later.  YOU SHOULD EXPECT: Some feelings of bloating in the abdomen. Passage of more gas than usual.  Walking can help get rid of the air that was put into your GI tract during the procedure and reduce the bloating. If you had a lower endoscopy (such as a colonoscopy or flexible sigmoidoscopy) you may notice spotting of blood in your stool or on the toilet paper. If you underwent a bowel prep for your procedure, you may not have a normal bowel movement for a few days.  Please Note:  You might notice some irritation and congestion in your nose or some drainage.  This is from the oxygen used during your procedure.  There is no need for concern and it should clear up in a day or so.  SYMPTOMS TO REPORT IMMEDIATELY:   Following lower endoscopy (colonoscopy or flexible sigmoidoscopy):  Excessive amounts of blood in the stool  Significant tenderness or worsening of abdominal pains  Swelling of the abdomen that is new, acute  Fever of 100F or higher   For urgent or emergent issues, a gastroenterologist can be reached at any hour by calling [phone].  Please see handout on diverticulosis.  DIET:  We do recommend a small meal at first, but then you may proceed to your regular diet.  Drink plenty of fluids but you should avoid alcoholic beverages for 24 hours.  ACTIVITY:  You should plan to take it easy for the rest of today and you should NOT DRIVE or use heavy machinery until tomorrow (because of the  sedation medicines used during the test).    FOLLOW UP: Our staff will call the number listed on your records the next business day following your procedure to check on you and address any questions or concerns that you may have regarding the information given to you following your procedure. If we do not reach you, we will leave a message.  However, if you are feeling well and you are not experiencing any problems, there is no need to return our call.  We will assume that you have returned to your regular daily activities without incident.  If any biopsies were taken you will be contacted by phone or by letter within the next 1-3 weeks.  Please call us at [phone] if you have not heard about the biopsies in 3 weeks.    SIGNATURES/CONFIDENTIALITY: You and/or your care partner have signed paperwork which will be entered into your electronic medical record.  These signatures attest to the fact that that the information above on your After Visit Summary has been reviewed and is understood.  Full responsibility of the confidentiality of this discharge information lies with you and/or your care-partner.   Thank you for allowing [place] to provide your healthcare today.

## 2017-12-26 ENCOUNTER — Telehealth: Payer: Self-pay | Admitting: *Deleted

## 2017-12-26 ENCOUNTER — Telehealth: Payer: Self-pay

## 2017-12-26 NOTE — Telephone Encounter (Signed)
Left message on f/u call 

## 2017-12-26 NOTE — Telephone Encounter (Signed)
Second post procedure follow up call, left voicemail

## 2018-01-02 DIAGNOSIS — H00024 Hordeolum internum left upper eyelid: Secondary | ICD-10-CM | POA: Diagnosis not present

## 2018-01-19 ENCOUNTER — Ambulatory Visit: Payer: Self-pay | Admitting: Family Medicine

## 2018-01-19 DIAGNOSIS — Z0289 Encounter for other administrative examinations: Secondary | ICD-10-CM

## 2018-01-28 DIAGNOSIS — H5319 Other subjective visual disturbances: Secondary | ICD-10-CM | POA: Diagnosis not present

## 2018-01-28 DIAGNOSIS — H43392 Other vitreous opacities, left eye: Secondary | ICD-10-CM | POA: Diagnosis not present

## 2018-01-28 DIAGNOSIS — H43812 Vitreous degeneration, left eye: Secondary | ICD-10-CM | POA: Diagnosis not present

## 2018-01-30 ENCOUNTER — Encounter: Payer: Self-pay | Admitting: Family Medicine

## 2018-01-30 ENCOUNTER — Ambulatory Visit (INDEPENDENT_AMBULATORY_CARE_PROVIDER_SITE_OTHER): Payer: 59 | Admitting: Family Medicine

## 2018-01-30 VITALS — BP 132/96 | HR 108 | Temp 98.9°F | Ht 64.0 in | Wt 146.0 lb

## 2018-01-30 DIAGNOSIS — E876 Hypokalemia: Secondary | ICD-10-CM

## 2018-01-30 DIAGNOSIS — Z0000 Encounter for general adult medical examination without abnormal findings: Secondary | ICD-10-CM

## 2018-01-30 DIAGNOSIS — I10 Essential (primary) hypertension: Secondary | ICD-10-CM

## 2018-01-30 LAB — BASIC METABOLIC PANEL
BUN: 16 mg/dL (ref 6–23)
CALCIUM: 9.8 mg/dL (ref 8.4–10.5)
CO2: 28 meq/L (ref 19–32)
CREATININE: 0.87 mg/dL (ref 0.40–1.20)
Chloride: 104 mEq/L (ref 96–112)
GFR: 70.47 mL/min (ref >60.00)
GLUCOSE: 98 mg/dL (ref 70–99)
Potassium: 4 mEq/L (ref 3.5–5.1)
Sodium: 139 mEq/L (ref 135–145)

## 2018-01-30 MED ORDER — POTASSIUM CHLORIDE CRYS ER 20 MEQ PO TBCR
20.0000 meq | EXTENDED_RELEASE_TABLET | Freq: Every day | ORAL | 2 refills | Status: DC
Start: 2018-01-30 — End: 2019-05-08

## 2018-01-30 MED ORDER — TRIAMTERENE-HCTZ 37.5-25 MG PO TABS: 1.0000 | ORAL_TABLET | Freq: Every day | ORAL | 3 refills | Status: DC

## 2018-01-30 NOTE — Progress Notes (Signed)
Subjective:     Angela [name] is a 62 y.o. female and is here for a comprehensive physical exam. The patient reports no problems.  Pt has been keeping busy, but is doing well.  Pt saw Dr. Daneen Schick, Cardiology for f/u on MVP 04/13/17.  At the time potassium was noted to be low on EKG and via BMP.  Pt started on K-dur 20 mEq.  Pt denies CP, SOB, LE edema.  Pt recently had colonoscopy done and seen by OB/Gyn.  Labs (lipid panel) obtained by OB/GYN were forwarded to this office.  To be scanned into chart.  Social History   Socioeconomic History  . Marital status: Married    Spouse name: Not on file  . Number of children: 0  . Years of education: Not on file  . Highest education level: Not on file  Occupational History  . Occupation: ATTORNEY    Employer: [employer]  . Financial resource strain: Not on file  . Food insecurity:    Worry: Not on file    Inability: Not on file  . Transportation needs:    Medical: Not on file    Non-medical: Not on file  Tobacco Use  . Smoking status: Never Smoker  . Smokeless tobacco: Never Used  Substance and Sexual Activity  . Alcohol use: Yes    Comment: 3 times a week  . Drug use: No  . Sexual activity: Not on file  Lifestyle  . Physical activity:    Days per week: Not on file    Minutes per session: Not on file  . Stress: Not on file  Relationships  . Social connections:    Talks on phone: Not on file    Gets together: Not on file    Attends religious service: Not on file    Active member of club or organization: Not on file    Attends meetings of clubs or organizations: Not on file    Relationship status: Not on file  . Intimate partner violence:    Fear of current or ex partner: Not on file    Emotionally abused: Not on file    Physically abused: Not on file    Forced sexual activity: Not on file  Other Topics Concern  . Not on file  Social History Narrative  . Not on file   Health Maintenance  Topic  Date Due  . TETANUS/TDAP  07/29/1976  . INFLUENZA VACCINE  01/12/2018  . Hepatitis C Screening  03/03/2018 (Originally Jul 04, 1955)  . HIV Screening  03/03/2018 (Originally 07/29/1972)  . PAP SMEAR  07/16/2019  . MAMMOGRAM  12/08/2019  . COLONOSCOPY  12/24/2027    The following portions of the patient's history were reviewed and updated as appropriate: allergies, current medications, past family history, past medical history, past social history, past surgical history and problem list.  Review of Systems A comprehensive review of systems was negative.   Objective:    BP (!) 132/96 (BP Location: Left Arm, Patient Position: Sitting, Cuff Size: Normal)   Pulse (!) 108   Temp 98.9 F (37.2 C) (Oral)   Ht 5\' 4"  (1.626 m)   Wt 146 lb (66.2 kg)   SpO2 97%   BMI 25.06 kg/m  General appearance: alert, cooperative and no distress Head: Normocephalic, without obvious abnormality, atraumatic Eyes: conjunctivae/corneas clear. PERRL, EOM's intact. Fundi benign. Ears: normal TM's and external ear canals both ears Nose: Nares normal. Septum midline. Mucosa normal. No drainage  or sinus tenderness. Throat: lips, mucosa, and tongue normal; teeth and gums normal Neck: no adenopathy, no carotid bruit, no JVD, supple, symmetrical, trachea midline and thyroid not enlarged, symmetric, no tenderness/mass/nodules Lungs: clear to auscultation bilaterally Heart: regular rate and rhythm, S1, S2 normal, no murmur, click, rub or gallop Abdomen: soft, non-tender; bowel sounds normal; no masses,  no organomegaly Extremities: extremities normal, atraumatic, no cyanosis or edema Skin: Skin color, texture, turgor normal. No rashes or lesions Lymph nodes: Cervical, supraclavicular, and axillary nodes normal.    Assessment:    Healthy female exam.      Plan:     Anticipatory guidance given including wearing seatbelts, smoke detectors in the home, increasing physical activity, increasing p.o. intake of water and  vegetables. -Will repeat BMP given h/o hypokalemia. -other labs recently done and normal including lipid panel. -no EKG indicated at this time. -pap up to date, followed by OB/Gyn -colonoscopy up to date, done  12/23/17, diverticulosis and internal hemorrhoids noted.  Next screening colonoscopy due in 10 yrs. -discussed shingles vaccine, given handout.  Pt wishes to wait on this. -pt to check on Tdap with [facility], unsure of last dose. See After Visit Summary for Counseling Recommendations    HTN -bp elevated 2/2 pt has not taken medication yet. -advised to take triamterene-HCTZ 37.5-25 mg -will obtain BMP to check potassium given use of HCTZ. -refills sent on triamterene-HCTZ 37.5-25mg  and Kdur 20 mEq. -if bp remains elevated pt to notify clinic  F/u prn  Grier Mitts, MD

## 2018-01-30 NOTE — Patient Instructions (Addendum)
Preventive Care 40-64 Years, Female Preventive care refers to lifestyle choices and visits with your health care provider that can promote health and wellness. What does preventive care include?  A yearly physical exam. This is also called an annual well check.  Dental exams once or twice a year.  Routine eye exams. Ask your health care provider how often you should have your eyes checked.  Personal lifestyle choices, including: ? Daily care of your teeth and gums. ? Regular physical activity. ? Eating a healthy diet. ? Avoiding tobacco and drug use. ? Limiting alcohol use. ? Practicing safe sex. ? Taking low-dose aspirin daily starting at age 58. ? Taking vitamin and mineral supplements as recommended by your health care provider. What happens during an annual well check? The services and screenings done by your health care provider during your annual well check will depend on your age, overall health, lifestyle risk factors, and family history of disease. Counseling Your health care provider may ask you questions about your:  Alcohol use.  Tobacco use.  Drug use.  Emotional well-being.  Home and relationship well-being.  Sexual activity.  Eating habits.  Work and work Statistician.  Method of birth control.  Menstrual cycle.  Pregnancy history.  Screening You may have the following tests or measurements:  Height, weight, and BMI.  Blood pressure.  Lipid and cholesterol levels. These may be checked every 5 years, or more frequently if you are over 81 years old.  Skin check.  Lung cancer screening. You may have this screening every year starting at age 78 if you have a 30-pack-year history of smoking and currently smoke or have quit within the past 15 years.  Fecal occult blood test (FOBT) of the stool. You may have this test every year starting at age 65.  Flexible sigmoidoscopy or colonoscopy. You may have a sigmoidoscopy every 5 years or a colonoscopy  every 10 years starting at age 30.  Hepatitis C blood test.  Hepatitis B blood test.  Sexually transmitted disease (STD) testing.  Diabetes screening. This is done by checking your blood sugar (glucose) after you have not eaten for a while (fasting). You may have this done every 1-3 years.  Mammogram. This may be done every 1-2 years. Talk to your health care provider about when you should start having regular mammograms. This may depend on whether you have a family history of breast cancer.  BRCA-related cancer screening. This may be done if you have a family history of breast, ovarian, tubal, or peritoneal cancers.  Pelvic exam and Pap test. This may be done every 3 years starting at age 80. Starting at age 36, this may be done every 5 years if you have a Pap test in combination with an HPV test.  Bone density scan. This is done to screen for osteoporosis. You may have this scan if you are at high risk for osteoporosis.  Discuss your test results, treatment options, and if necessary, the need for more tests with your health care provider. Vaccines Your health care provider may recommend certain vaccines, such as:  Influenza vaccine. This is recommended every year.  Tetanus, diphtheria, and acellular pertussis (Tdap, Td) vaccine. You may need a Td booster every 10 years.  Varicella vaccine. You may need this if you have not been vaccinated.  Zoster vaccine. You may need this after age 5.  Measles, mumps, and rubella (MMR) vaccine. You may need at least one dose of MMR if you were born in  1955 or later. You may also need a second dose.  Pneumococcal 13-valent conjugate (PCV13) vaccine. You may need this if you have certain conditions and were not previously vaccinated.  Pneumococcal polysaccharide (PPSV23) vaccine. You may need one or two doses if you smoke cigarettes or if you have certain conditions.  Meningococcal vaccine. You may need this if you have certain  conditions.  Hepatitis A vaccine. You may need this if you have certain conditions or if you travel or work in places where you may be exposed to hepatitis A.  Hepatitis B vaccine. You may need this if you have certain conditions or if you travel or work in places where you may be exposed to hepatitis B.  Haemophilus influenzae type b (Hib) vaccine. You may need this if you have certain conditions.  Talk to your health care provider about which screenings and vaccines you need and how often you need them. This information is not intended to replace advice given to you by your health care provider. Make sure you discuss any questions you have with your health care provider. Document Released: 06/27/2015 Document Revised: 02/18/2016 Document Reviewed: 04/01/2015 Elsevier Interactive Patient Education  2018 [place] Zoster (Shingles) Vaccine, RZV: What You Need to Know 1. Why get vaccinated? Shingles (also called herpes zoster, or just zoster) is a painful skin rash, often with blisters. Shingles is caused by the varicella zoster virus, the same virus that causes chickenpox. After you have chickenpox, the virus stays in your body and can cause shingles later in life. You can't catch shingles from another person. However, a person who has never had chickenpox (or chickenpox vaccine) could get chickenpox from someone with shingles. A shingles rash usually appears on one side of the face or body and heals within 2 to 4 weeks. Its main symptom is pain, which can be severe. Other symptoms can include fever, headache, chills and upset stomach. Very rarely, a shingles infection can lead to pneumonia, hearing problems, blindness, brain inflammation (encephalitis), or death. For about 1 person in 5, severe pain can continue even long after the rash has cleared up. This long-lasting pain is called post-herpetic neuralgia (PHN). Shingles is far more common in people 59 years of age and older  than in younger people, and the risk increases with age. It is also more common in people whose immune system is weakened because of a disease such as cancer, or by drugs such as steroids or chemotherapy. At least 1 million people a year in the [place] get shingles. 2. Shingles vaccine (recombinant) Recombinant shingles vaccine was approved by FDA in 2017 for the prevention of shingles. In clinical trials, it was more than 90% effective in preventing shingles. It can also reduce the likelihood of PHN. Two doses, 2 to 6 months apart, are recommended for adults 27 and older. This vaccine is also recommended for people who have already gotten the live shingles vaccine (Zostavax). There is no live virus in this vaccine. 3. Some people should not get this vaccine Tell your vaccine provider if you:  Have any severe, life-threatening allergies. A person who has ever had a life-threatening allergic reaction after a dose of recombinant shingles vaccine, or has a severe allergy to any component of this vaccine, may be advised not to be vaccinated. Ask your health care provider if you want information about vaccine components.  Are pregnant or breastfeeding. There is not much information about use of recombinant shingles vaccine in pregnant or nursing  women. Your healthcare provider might recommend delaying vaccination.  Are not feeling well. If you have a mild illness, such as a cold, you can probably get the vaccine today. If you are moderately or severely ill, you should probably wait until you recover. Your doctor can advise you.  4. Risks of a vaccine reaction With any medicine, including vaccines, there is a chance of reactions. After recombinant shingles vaccination, a person might experience:  Pain, redness, soreness, or swelling at the site of the injection  Headache, muscle aches, fever, shivering, fatigue  In clinical trials, most people got a sore arm with mild or moderate pain after  vaccination, and some also had redness and swelling where they got the shot. Some people felt tired, had muscle pain, a headache, shivering, fever, stomach pain, or nausea. About 1 out of 6 people who got recombinant zoster vaccine experienced side effects that prevented them from doing regular activities. Symptoms went away on their own in about 2 to 3 days. Side effects were more common in younger people. You should still get the second dose of recombinant zoster vaccine even if you had one of these reactions after the first dose. Other things that could happen after this vaccine:  People sometimes faint after medical procedures, including vaccination. Sitting or lying down for about 15 minutes can help prevent fainting and injuries caused by a fall. Tell your provider if you feel dizzy or have vision changes or ringing in the ears.  Some people get shoulder pain that can be more severe and longer-lasting than routine soreness that can follow injections. This happens very rarely.  Any medication can cause a severe allergic reaction. Such reactions to a vaccine are estimated at about 1 in a million doses, and would happen within a few minutes to a few hours after the vaccination. As with any medicine, there is a very remote chance of a vaccine causing a serious injury or death. The safety of vaccines is always being monitored. For more information, visit: http://www.aguilar.org/ 5. What if there is a serious problem? What should I look for?  Look for anything that concerns you, such as signs of a severe allergic reaction, very high fever, or unusual behavior. Signs of a severe allergic reaction can include hives, swelling of the face and throat, difficulty breathing, a fast heartbeat, dizziness, and weakness. These would usually start a few minutes to a few hours after the vaccination. What should I do?  If you think it is a severe allergic reaction or other emergency that can't wait, call  9-1-1 and get to the nearest hospital. Otherwise, call your health care provider. Afterward, the reaction should be reported to the Vaccine Adverse Event Reporting System (VAERS). Your doctor should file this report, or you can do it yourself through the VAERS web site atwww.vaers.https://www.bray.com/ by calling (514)478-9373. VAERS does not give medical advice. 6. How can I learn more?  Ask your healthcare provider. He or she can give you the vaccine package insert or suggest other sources of information.  Call your local or state health department.  Contact the Centers for Disease Control and Prevention (CDC): ? Call (939) 265-3759 (1-800-CDC-INFO) or ? Visit the CDC's website at http://hunter.com/ CDC Vaccine Information Statement (VIS) Recombinant Zoster Vaccine (07/26/2016) This information is not intended to replace advice given to you by your health care provider. Make sure you discuss any questions you have with your health care provider. Document Released: 08/10/2016 Document Revised: 08/10/2016 Document Reviewed: 08/10/2016 Elsevier Interactive  Patient Education  2018 [place]. Potassium Content of Foods Potassium is a mineral found in many foods and drinks. It helps keep fluids and minerals balanced in your body and affects how steadily your heart beats. Potassium also helps control your blood pressure and keep your muscles and nervous system healthy. Certain health conditions and medicines may change the balance of potassium in your body. When this happens, you can help balance your level of potassium through the foods that you do or do not eat. Your health care provider or dietitian may recommend an amount of potassium that you should have each day. The following lists of foods provide the amount of potassium (in parentheses) per serving in each item. High in potassium The following foods and beverages have 200 mg or more of potassium per serving:  Apricots, 2 raw or 5 dry (200  mg).  Artichoke, 1 medium (345 mg).  Avocado, raw,  each (245 mg).  Banana, 1 medium (425 mg).  Beans, lima, or baked beans, canned,  cup (280 mg).  Beans, white, canned,  cup (595 mg).  Beef roast, 3 oz (320 mg).  Beef, ground, 3 oz (270 mg).  Beets, raw or cooked,  cup (260 mg).  Bran muffin, 2 oz (300 mg).  Broccoli,  cup (230 mg).  Brussels sprouts,  cup (250 mg).  Cantaloupe,  cup (215 mg).  Cereal, 100% bran,  cup (200-400 mg).  Cheeseburger, single, fast food, 1 each (225-400 mg).  Chicken, 3 oz (220 mg).  Clams, canned, 3 oz (535 mg).  Crab, 3 oz (225 mg).  Dates, 5 each (270 mg).  Dried beans and peas,  cup (300-475 mg).  Figs, dried, 2 each (260 mg).  Fish: halibut, tuna, cod, snapper, 3 oz (480 mg).  Fish: salmon, haddock, swordfish, perch, 3 oz (300 mg).  Fish, tuna, canned 3 oz (200 mg).  [place] fries, fast food, 3 oz (470 mg).  Granola with fruit and nuts,  cup (200 mg).  Grapefruit juice,  cup (200 mg).  Greens, beet,  cup (655 mg).  Honeydew melon,  cup (200 mg).  Kale, raw, 1 cup (300 mg).  Kiwi, 1 medium (240 mg).  Kohlrabi, rutabaga, parsnips,  cup (280 mg).  Lentils,  cup (365 mg).  Mango, 1 each (325 mg).  Milk, chocolate, 1 cup (420 mg).  Milk: nonfat, low-fat, whole, buttermilk, 1 cup (350-380 mg).  Molasses, 1 Tbsp (295 mg).  Mushrooms,  cup (280) mg.  Nectarine, 1 each (275 mg).  Nuts: almonds, peanuts, hazelnuts, [place], cashew, mixed, 1 oz (200 mg).  Nuts, pistachios, 1 oz (295 mg).  Orange, 1 each (240 mg).  Orange juice,  cup (235 mg).  Papaya, medium,  fruit (390 mg).  Peanut butter, chunky, 2 Tbsp (240 mg).  Peanut butter, smooth, 2 Tbsp (210 mg).  Pear, 1 medium (200 mg).  Pomegranate, 1 whole (400 mg).  Pomegranate juice,  cup (215 mg).  Pork, 3 oz (350 mg).  Potato chips, salted, 1 oz (465 mg).  Potato, baked with skin, 1 medium (925 mg).  Potatoes, boiled,   cup (255 mg).  Potatoes, mashed,  cup (330 mg).  Prune juice,  cup (370 mg).  Prunes, 5 each (305 mg).  Pudding, chocolate,  cup (230 mg).  Pumpkin, canned,  cup (250 mg).  Raisins, seedless,  cup (270 mg).  Seeds, sunflower or pumpkin, 1 oz (240 mg).  Soy milk, 1 cup (300 mg).  Spinach,  cup (420 mg).  Spinach,  canned,  cup (370 mg).  Sweet potato, baked with skin, 1 medium (450 mg).  Swiss chard,  cup (480 mg).  Tomato or vegetable juice,  cup (275 mg).  Tomato sauce or puree,  cup (400-550 mg).  Tomato, raw, 1 medium (290 mg).  Tomatoes, canned,  cup (200-300 mg).  [place], 3 oz (250 mg).  Wheat germ, 1 oz (250 mg).  Winter squash,  cup (250 mg).  Yogurt, plain or fruited, 6 oz (260-435 mg).  Zucchini,  cup (220 mg).  Moderate in potassium The following foods and beverages have 50-200 mg of potassium per serving:  Apple, 1 each (150 mg).  Apple juice,  cup (150 mg).  Applesauce,  cup (90 mg).  Apricot nectar,  cup (140 mg).  Asparagus, small spears,  cup or 6 spears (155 mg).  Bagel, cinnamon raisin, 1 each (130 mg).  Bagel, egg or plain, 4 in., 1 each (70 mg).  Beans, green,  cup (90 mg).  Beans, yellow,  cup (190 mg).  Beer, regular, 12 oz (100 mg).  Beets, canned,  cup (125 mg).  Blackberries,  cup (115 mg).  Blueberries,  cup (60 mg).  Bread, whole wheat, 1 slice (70 mg).  Broccoli, raw,  cup (145 mg).  Cabbage,  cup (150 mg).  Carrots, cooked or raw,  cup (180 mg).  Cauliflower, raw,  cup (150 mg).  Celery, raw,  cup (155 mg).  Cereal, bran flakes, cup (120-150 mg).  Cheese, cottage,  cup (110 mg).  Cherries, 10 each (150 mg).  Chocolate, 1 oz bar (165 mg).  Coffee, brewed 6 oz (90 mg).  Corn,  cup or 1 ear (195 mg).  Cucumbers,  cup (80 mg).  Egg, large, 1 each (60 mg).  Eggplant,  cup (60 mg).  Endive, raw, cup (80 mg).  English muffin, 1 each (65 mg).  Fish, orange  roughy, 3 oz (150 mg).  Frankfurter, beef or pork, 1 each (75 mg).  Fruit cocktail,  cup (115 mg).  Grape juice,  cup (170 mg).  Grapefruit,  fruit (175 mg).  Grapes,  cup (155 mg).  Greens: kale, turnip, collard,  cup (110-150 mg).  Ice cream or frozen yogurt, chocolate,  cup (175 mg).  Ice cream or frozen yogurt, vanilla,  cup (120-150 mg).  Lemons, limes, 1 each (80 mg).  Lettuce, all types, 1 cup (100 mg).  Mixed vegetables,  cup (150 mg).  Mushrooms, raw,  cup (110 mg).  Nuts: walnuts, pecans, or macadamia, 1 oz (125 mg).  Oatmeal,  cup (80 mg).  Okra,  cup (110 mg).  Onions, raw,  cup (120 mg).  Peach, 1 each (185 mg).  Peaches, canned,  cup (120 mg).  Pears, canned,  cup (120 mg).  Peas, green, frozen,  cup (90 mg).  Peppers, green,  cup (130 mg).  Peppers, red,  cup (160 mg).  Pineapple juice,  cup (165 mg).  Pineapple, fresh or canned,  cup (100 mg).  Plums, 1 each (105 mg).  Pudding, vanilla,  cup (150 mg).  Raspberries,  cup (90 mg).  Rhubarb,  cup (115 mg).  Rice, wild,  cup (80 mg).  Shrimp, 3 oz (155 mg).  Spinach, raw, 1 cup (170 mg).  Strawberries,  cup (125 mg).  Summer squash  cup (175-200 mg).  Swiss chard, raw, 1 cup (135 mg).  Tangerines, 1 each (140 mg).  Tea, brewed, 6 oz (65 mg).  Turnips,  cup (140 mg).  Watermelon,  cup (  85 mg).  Wine, red, table, 5 oz (180 mg).  Wine, white, table, 5 oz (100 mg).  Low in potassium The following foods and beverages have less than 50 mg of potassium per serving.  Bread, white, 1 slice (30 mg).  Carbonated beverages, 12 oz (less than 5 mg).  Cheese, 1 oz (20-30 mg).  Cranberries,  cup (45 mg).  Cranberry juice cocktail,  cup (20 mg).  Fats and oils, 1 Tbsp (less than 5 mg).  Hummus, 1 Tbsp (32 mg).  Nectar: papaya, mango, or pear,  cup (35 mg).  Rice, white or brown,  cup (50 mg).  Spaghetti or macaroni,  cup cooked (30  mg).  Tortilla, flour or corn, 1 each (50 mg).  Waffle, 4 in., 1 each (50 mg).  Water chestnuts,  cup (40 mg).  This information is not intended to replace advice given to you by your health care provider. Make sure you discuss any questions you have with your health care provider. Document Released: 01/12/2005 Document Revised: 11/06/2015 Document Reviewed: 04/27/2013 Elsevier Interactive Patient Education  [employer].

## 2018-02-01 ENCOUNTER — Encounter: Payer: Self-pay | Admitting: Family Medicine

## 2018-02-07 ENCOUNTER — Telehealth: Payer: Self-pay | Admitting: Family Medicine

## 2018-02-07 NOTE — Telephone Encounter (Signed)
Copied from [place]. Topic: Inquiry >> Feb 07, 2018 11:58 AM Scherrie Gerlach wrote: Reason for CRM: pt instructed to re do her potassium level in a few weeks. Pt made appt for 9/13. Can she just do the lab, or does she need an appt? Or do lab prior to OV?  Pt had her personal asst Mady Callawy call to schedule and ask

## 2018-02-09 ENCOUNTER — Ambulatory Visit: Payer: 59 | Admitting: Family Medicine

## 2018-02-09 DIAGNOSIS — H43812 Vitreous degeneration, left eye: Secondary | ICD-10-CM | POA: Diagnosis not present

## 2018-02-09 DIAGNOSIS — H43392 Other vitreous opacities, left eye: Secondary | ICD-10-CM | POA: Diagnosis not present

## 2018-02-10 ENCOUNTER — Other Ambulatory Visit: Payer: Self-pay | Admitting: Family Medicine

## 2018-02-10 DIAGNOSIS — Z8639 Personal history of other endocrine, nutritional and metabolic disease: Secondary | ICD-10-CM

## 2018-02-10 NOTE — Telephone Encounter (Signed)
Pt just needs lab appt to have potassium rechecked. Order placed.

## 2018-02-10 NOTE — Telephone Encounter (Signed)
Patient informed of the message below and lab appt was scheduled-office visit cancelled.

## 2018-02-24 ENCOUNTER — Ambulatory Visit: Payer: 59 | Admitting: Family Medicine

## 2018-02-24 ENCOUNTER — Other Ambulatory Visit (INDEPENDENT_AMBULATORY_CARE_PROVIDER_SITE_OTHER): Payer: 59

## 2018-02-24 DIAGNOSIS — Z8639 Personal history of other endocrine, nutritional and metabolic disease: Secondary | ICD-10-CM

## 2018-02-24 LAB — POTASSIUM: POTASSIUM: 3.9 meq/L (ref 3.5–5.1)

## 2018-03-02 ENCOUNTER — Other Ambulatory Visit: Payer: Self-pay | Admitting: Internal Medicine

## 2018-03-02 DIAGNOSIS — I10 Essential (primary) hypertension: Secondary | ICD-10-CM

## 2018-03-23 ENCOUNTER — Other Ambulatory Visit: Payer: Self-pay | Admitting: Internal Medicine

## 2018-04-23 ENCOUNTER — Other Ambulatory Visit: Payer: Self-pay | Admitting: Internal Medicine

## 2018-06-02 ENCOUNTER — Other Ambulatory Visit: Payer: Self-pay | Admitting: Internal Medicine

## 2018-07-05 ENCOUNTER — Other Ambulatory Visit: Payer: Self-pay | Admitting: Internal Medicine

## 2018-07-05 MED ORDER — BECLOMETHASONE DIPROP HFA 80 MCG/ACT IN AERB: 2.0000 | INHALATION_SPRAY | Freq: Two times a day (BID) | RESPIRATORY_TRACT | 0 refills | Status: DC

## 2018-08-07 ENCOUNTER — Telehealth: Payer: Self-pay | Admitting: Internal Medicine

## 2018-08-07 NOTE — Telephone Encounter (Signed)
Go ahead and give 90 days and have her see me before this runs out

## 2018-08-07 NOTE — Telephone Encounter (Signed)
[place], spoke with Cassie.  She stated that Patient was requesting a new refill for Qvar, last refill was 07/05/18.  Patient was last seen 02/21/17, by Dr Melvyn Novas.  Patient does not have any future appointments.     Dr Melvyn Novas, please advise on refill

## 2018-08-07 NOTE — Telephone Encounter (Signed)
ATC patient to make appointment before ordering inhaler, unable to speak to patient. Left message for patient to call back on 08/08/18

## 2018-08-08 NOTE — Telephone Encounter (Signed)
Patient returned call, CB is [phone].

## 2018-08-08 NOTE — Telephone Encounter (Signed)
Left message for patient to call back  

## 2018-08-08 NOTE — Telephone Encounter (Signed)
ATC pt, no answer. Left message for pt to call back.  

## 2018-08-09 NOTE — Telephone Encounter (Signed)
lmtcb x1 pt 

## 2018-08-10 MED ORDER — BECLOMETHASONE DIPROPIONATE 80 MCG/ACT IN AERS: 1.0000 | INHALATION_SPRAY | Freq: Two times a day (BID) | RESPIRATORY_TRACT | 2 refills | Status: DC

## 2018-08-10 NOTE — Telephone Encounter (Signed)
Spoke with pt and scheduled appt with Dr Melvyn Novas on 08/17/2018 at 1130am.  Qvar script sent to preferred pharmacy.  Nothing further needed.

## 2018-08-10 NOTE — Addendum Note (Signed)
Addended by: Karmen Stabs on: 08/10/2018 10:26 AM   Modules accepted: Orders

## 2018-08-17 ENCOUNTER — Ambulatory Visit: Payer: 59 | Admitting: Internal Medicine

## 2018-08-17 ENCOUNTER — Encounter: Payer: Self-pay | Admitting: Internal Medicine

## 2018-08-17 VITALS — BP 128/70 | HR 88 | Ht 64.0 in | Wt 148.2 lb

## 2018-08-17 DIAGNOSIS — J4530 Mild persistent asthma, uncomplicated: Secondary | ICD-10-CM | POA: Diagnosis not present

## 2018-08-17 MED ORDER — ALBUTEROL SULFATE HFA 108 (90 BASE) MCG/ACT IN AERS: INHALATION_SPRAY | RESPIRATORY_TRACT | 1 refills | Status: DC

## 2018-08-17 MED ORDER — BECLOMETHASONE DIPROP HFA 80 MCG/ACT IN AERB: 2.0000 | INHALATION_SPRAY | Freq: Two times a day (BID) | RESPIRATORY_TRACT | 11 refills | Status: DC

## 2018-08-17 NOTE — Patient Instructions (Addendum)
Plan A = Automatic = Qvar 40 2 each am and extra 2 puffs if needed  X at least 7 days   Plan B = Backup Only use your albuterol inhaler as a rescue medication to be used if you can't catch your breath by resting or doing a relaxed purse lip breathing pattern.  - The less you use it, the better it will work when you need it. - Ok to use the inhaler up to 2 puffs  every 4 hours if you must but call for appointment if use goes up over your usual need - Don't leave home without it !!  (think of it like the spare tire for your car)    Late add:  Follow up yearly, sooner if needed discussed

## 2018-08-17 NOTE — Progress Notes (Signed)
Subjective:    Patient ID: Angela [name] [facility], female   DOB: 06-14-1956    MRN: [id number]    Brief patient profile:  63  yowf never smoker history of chronic asthma as child, then recurred age 63 with one episode life threatening exacerbation and admit to ICU [facility] in 1993 and on maint rx ever since with ICS.    History of Present Illness  01/26/2016  f/u ov/Angela [name] re: chronic asthma  Chief Complaint  Patient presents with  . Follow-up    6 month rov.  needs qvar refill.  denies any complaints today.     some symptoms in [facility]  Heat this summer and with and occ heavy exertion/ chest  Tight always better with saba avg use of saba maybe once a month  rec Plan A = Automatic =  qvar 80 Take 2 puffs first thing in am and then another 2 puffs about 12 hours later if not feeling 100%  Plan B = Backup Only use your albuterol as a rescue medication     11/11/2016  f/u ov/Angela [name] re: cough flare  Chief Complaint  Patient presents with  . Acute Visit    Pt c/o prod cough - pt unsure of color x 2 weeks. Pt denies SOB, CP/tightness, f/c/s.   as instructed qvar 80 2bid and ranitidine once daily but now on estrogen/ progesterone for bone loss issues  Severe coughing fits hourly, worse with voice use, cold liquids/ but occur 24/7 but no sob or increased saba need  rec zpak  Prednisone 10 mg take  4 each am x 2 days,   2 each am x 2 days,  1 each am x 2 days and stop  Take delsym two tsp every 12 hours and supplement if needed with  Roxicodone 5mg   every 4 hours Try prilosec otc 20mg   Take 30-60 min before first meal of the day and Pepcid ac (famotidine) 20 mg one @  bedtime until cough is completely gone for at least a week without the need for cough suppression GERD (REFLUX)    02/21/2017  f/u ov/Angela [name] re: chronic asthma/ hbp on qvar 80  2 each am  Chief Complaint  Patient presents with  . Follow-up    Pt concerned with blood pressure. Overall feeling well, stable with asthma.   needs hfa  once a month at most, rarely needs to take more than 2 puffs of qvar 80 in am but not comfortable with device yet  rec Work on maintaining perfect  inhaler technique:    Maxzide 25 one each am should improve both your blood pressure and your calcium balance Please see patient coordinator before you leave today  to schedule Internal medicine Please schedule a follow up visit in 12 months but call sooner if needed     08/17/2018  f/u ov/Angela [name] re: chronic asthma  Chief Complaint  Patient presents with  . Needs refill of Qvar    No issues, OV needed for prescription, last visit in 2018.   Dyspnea:  Working out at the [employer] no prob  Cough:  Very rare eg dust exp  Sleeping: no disturbance,  bed flat SABA use: none      No obvious day to day or daytime variability or assoc excess/ purulent sputum or mucus plugs or hemoptysis or cp or chest tightness, subjective wheeze or overt sinus or hb symptoms.   Sleeping as above  without nocturnal  or early am exacerbation  of respiratory  c/o's  or need for noct saba. Also denies any obvious fluctuation of symptoms with weather or environmental changes or other aggravating or alleviating factors except as outlined above   No unusual exposure hx or h/o childhood pna/ asthma or knowledge of premature birth.  Current Allergies, Complete Past Medical History, Past Surgical History, Family History, and Social History were reviewed in [employer] record.  ROS  The following are not active complaints unless bolded Hoarseness, sore throat, dysphagia, dental problems, itching, sneezing,  nasal congestion or discharge of excess mucus or purulent secretions, ear ache,   fever, chills, sweats, unintended wt loss or wt gain, classically pleuritic or exertional cp,  orthopnea pnd or arm/hand swelling  or leg swelling, presyncope, palpitations, abdominal pain, anorexia, nausea, vomiting, diarrhea  or change in bowel habits or change in bladder habits,  change in stools or change in urine, dysuria, hematuria,  rash, arthralgias, visual complaints, headache, numbness, weakness or ataxia or problems with walking or coordination,  change in mood or  memory.        Current Meds  Medication Sig  . albuterol (VENTOLIN HFA) 108 (90 Base) MCG/ACT inhaler 2 puffs every 4 hours if needed  . beclomethasone (QVAR REDIHALER) 80 MCG/ACT inhaler Inhale 2 puffs into the lungs 2 (two) times daily.  . beclomethasone (QVAR) 80 MCG/ACT inhaler Inhale 1 puff into the lungs 2 (two) times daily.  . Ergocalciferol (VITAMIN D2) 400 units TABS Take 1 tablet by mouth daily.  [name] estradiol (EVAMIST) 1.53 MG/SPRAY transdermal spray Place 2 sprays onto the skin daily.  . potassium chloride SA (K-DUR,KLOR-CON) 20 MEQ tablet Take 1 tablet (20 mEq total) by mouth daily. (Patient taking differently: Take 20 mEq by mouth. 3 times a week)  . Progesterone Micronized (PROMETRIUM PO) Take 1 capsule by mouth daily. 100mg  capsule once daily  . triamterene-hydrochlorothiazide (MAXZIDE-25) 37.5-25 MG tablet TAKE 1 TABLET BY MOUTH ONCE DAILY                 Past Medical History:  ASTHMA--chronic mild ...........................................................................Marland KitchenWert  - h/o status asthmaticus/ vent dep 1993 GERD (ICD-530.81)--prev. endo 10/2002--GERD/esophageal stricture HIATAL HERNIA (ICD-553.3)  MITRAL VALVE PROLAPSE (ICD-424.0)  a. echo 4/08: EF 60%. minimal prolapse of posterior leaflet. trivial MR  b. ECH0 4/10: EF 55-60%. mild to moderate MVP with mild MR  Palpitations thought due to SVT  RENAL CALCULUS, HX OF (ICD-V13.01)  HYPERTENSION (ICD-401.9)          Objective:   Physical Exam   Delightful amb wf nad   Vital signs reviewed - Note on arrival 02 sats  97% on RA    Wt 135 04/12/2011 > 129 05/03/12 > 05/15/2013  131 > 05/24/2014  138>143 07/22/2015 > 01/26/2016 152  >  11/11/2016  152 > 02/21/2017 147 > 08/17/2018  148     HEENT: nl dentition,  turbinates bilaterally, and oropharynx. Nl external ear canals without cough reflex   NECK :  without JVD/Nodes/TM/ nl carotid upstrokes bilaterally   LUNGS: no acc muscle use,  Nl contour chest which is clear to A and P bilaterally without cough on insp or exp maneuvers   CV:  RRR  no s3 or murmur or increase in P2, and no edema   ABD:  soft and nontender with nl inspiratory excursion in the supine position. No bruits or organomegaly appreciated, bowel sounds nl  MS:  Nl gait/ ext warm without deformities, calf tenderness, cyanosis or clubbing No obvious joint restrictions   SKIN:  warm and dry without lesions    NEURO:  alert, approp, nl sensorium with  no motor or cerebellar deficits apparent.                  Assessment:

## 2018-08-20 ENCOUNTER — Encounter: Payer: Self-pay | Admitting: Internal Medicine

## 2018-08-20 NOTE — Assessment & Plan Note (Signed)
-   PFT's [facility] 07/04/13  FEV1  2.12 (82%) ratio 69 - FENO 01/26/2016  =   27 on qvar 80 2 q am  - FENO 11/11/2016  =   38 on qvar 2 bid during flare > rec Prednisone 10 mg take  4 each am x 2 days,   2 each am x 2 days,  1 each am x 2 days and stop and no change in qvar  - 02/21/2017  After extensive coaching device  effectiveness =    100% with redihaler    All goals of chronic asthma control met including optimal function and elimination of symptoms with minimal need for rescue therapy.  Contingencies discussed in full including contacting this office immediately if not controlling the symptoms using the rule of two's.     Each maintenance medication was reviewed in detail including most importantly the difference between maintenance and as needed and under what circumstances the prns are to be used.  Please see AVS for specific  Instructions which are unique to this visit and I personally typed out  which were reviewed in detail in writing with the patient and a copy provided.    F/u yearly, sooner prn

## 2018-09-18 ENCOUNTER — Other Ambulatory Visit: Payer: Self-pay | Admitting: Internal Medicine

## 2018-09-18 MED ORDER — ALBUTEROL SULFATE HFA 108 (90 BASE) MCG/ACT IN AERS: INHALATION_SPRAY | RESPIRATORY_TRACT | 5 refills | Status: DC

## 2018-11-22 ENCOUNTER — Other Ambulatory Visit: Payer: Self-pay

## 2018-11-22 ENCOUNTER — Ambulatory Visit
Admission: RE | Admit: 2018-11-22 | Discharge: 2018-11-22 | Disposition: A | Discharge status: Home / Self Care | Payer: 59 | Source: Ambulatory Visit | Attending: Physical Medicine and Rehabilitation | Admitting: Physical Medicine and Rehabilitation

## 2018-11-22 ENCOUNTER — Other Ambulatory Visit: Payer: Self-pay | Admitting: Physical Medicine and Rehabilitation

## 2018-11-22 DIAGNOSIS — M542 Cervicalgia: Secondary | ICD-10-CM

## 2018-12-08 ENCOUNTER — Other Ambulatory Visit: Payer: Self-pay | Admitting: Obstetrics and Gynecology

## 2018-12-08 DIAGNOSIS — Z1231 Encounter for screening mammogram for malignant neoplasm of breast: Secondary | ICD-10-CM

## 2018-12-11 ENCOUNTER — Other Ambulatory Visit: Payer: Self-pay

## 2018-12-11 ENCOUNTER — Ambulatory Visit
Admission: RE | Admit: 2018-12-11 | Discharge: 2018-12-11 | Disposition: A | Discharge status: Home / Self Care | Payer: 59 | Source: Ambulatory Visit | Attending: Obstetrics and Gynecology | Admitting: Obstetrics and Gynecology

## 2018-12-11 DIAGNOSIS — Z1231 Encounter for screening mammogram for malignant neoplasm of breast: Secondary | ICD-10-CM

## 2018-12-22 ENCOUNTER — Other Ambulatory Visit: Payer: Self-pay | Admitting: Obstetrics and Gynecology

## 2018-12-22 DIAGNOSIS — M810 Age-related osteoporosis without current pathological fracture: Secondary | ICD-10-CM

## 2019-03-01 ENCOUNTER — Other Ambulatory Visit: Payer: Self-pay | Admitting: Family Medicine

## 2019-03-01 DIAGNOSIS — I10 Essential (primary) hypertension: Secondary | ICD-10-CM

## 2019-03-01 MED ORDER — TRIAMTERENE-HCTZ 37.5-25 MG PO TABS: 1.0000 | ORAL_TABLET | Freq: Every day | ORAL | 0 refills | Status: DC

## 2019-03-01 NOTE — Telephone Encounter (Signed)
Medication Refill - Medication: triamterene-hydrochlorothiazide (MAXZIDE-25) 37.5-25 MG tablet    Preferred Pharmacy (with phone number or street name):  [phone] - [place], [place] - [place] [phone] (Phone) [phone] (Fax)

## 2019-03-01 NOTE — Telephone Encounter (Signed)
Requested medication (s) are due for refill today: yes  Requested medication (s) are on the active medication list: yes  Last refill:  03/02/2018  Future visit scheduled: no  Notes to clinic:  Review for refill  Requested Prescriptions  Pending Prescriptions Disp Refills   triamterene-hydrochlorothiazide (MAXZIDE-25) 37.5-25 MG tablet 30 tablet 0    Sig: Take 1 tablet by mouth daily.     Cardiovascular: Diuretic Combos Failed - 03/01/2019 11:56 AM      Failed - K in normal range and within 360 days    Potassium  Date Value Ref Range Status  02/24/2018 3.9 3.5 - 5.1 mEq/L Final         Failed - Na in normal range and within 360 days    Sodium  Date Value Ref Range Status  01/30/2018 139 135 - 145 mEq/L Final  04/26/2017 139 134 - 144 mmol/L Final         Failed - Cr in normal range and within 360 days    Creatinine, Ser  Date Value Ref Range Status  01/30/2018 0.87 0.40 - 1.20 mg/dL Final         Failed - Ca in normal range and within 360 days    Calcium  Date Value Ref Range Status  01/30/2018 9.8 8.4 - 10.5 mg/dL Final         Failed - Valid encounter within last 6 months    Recent Outpatient Visits          1 year ago Well adult exam   Therapist, music at [facility], [place]y Adie, MD   1 year ago Essential hypertension   Therapist, music at [place], [place]             Passed - Last BP in normal range    BP Readings from Last 1 Encounters:  08/17/18 128/70

## 2019-03-01 NOTE — Telephone Encounter (Signed)
Pt needs an appointment for further refills  

## 2019-03-13 ENCOUNTER — Other Ambulatory Visit: Payer: Self-pay

## 2019-03-13 ENCOUNTER — Ambulatory Visit
Admission: RE | Admit: 2019-03-13 | Discharge: 2019-03-13 | Disposition: A | Discharge status: Home / Self Care | Payer: 59 | Source: Ambulatory Visit | Attending: Obstetrics and Gynecology | Admitting: Obstetrics and Gynecology

## 2019-03-13 DIAGNOSIS — M810 Age-related osteoporosis without current pathological fracture: Secondary | ICD-10-CM

## 2019-04-24 ENCOUNTER — Other Ambulatory Visit: Payer: Self-pay | Admitting: Internal Medicine

## 2019-04-24 DIAGNOSIS — I10 Essential (primary) hypertension: Secondary | ICD-10-CM

## 2019-05-08 ENCOUNTER — Telehealth (INDEPENDENT_AMBULATORY_CARE_PROVIDER_SITE_OTHER): Payer: 59 | Admitting: Family Medicine

## 2019-05-08 DIAGNOSIS — E876 Hypokalemia: Secondary | ICD-10-CM

## 2019-05-08 DIAGNOSIS — I10 Essential (primary) hypertension: Secondary | ICD-10-CM | POA: Diagnosis not present

## 2019-05-08 MED ORDER — TRIAMTERENE-HCTZ 37.5-25 MG PO TABS: 1.0000 | ORAL_TABLET | Freq: Every day | ORAL | 3 refills | Status: DC

## 2019-05-08 MED ORDER — POTASSIUM CHLORIDE CRYS ER 20 MEQ PO TBCR: 20.0000 meq | EXTENDED_RELEASE_TABLET | Freq: Every day | ORAL | 3 refills | Status: DC

## 2019-05-08 NOTE — Progress Notes (Signed)
Virtual Visit via Video Note  I connected with Angela [name], Angela [name] on 05/08/19 at  4:30 PM EST by a video enabled telemedicine application 2/2 [id number] pandemic and verified that I am speaking with the correct person using two identifiers.  Location patient: home Location provider:work or home office Persons participating in the virtual visit: patient, provider  I discussed the limitations of evaluation and management by telemedicine and the availability of in person appointments. The patient expressed understanding and agreed to proceed.   HPI: Pt has been doing well, keeping busy with work.  She was seen today for refills on Triamterene 37.5-25 mg daily.  Pt denies HAs, changes in vision, CP, dizziness.  Pt had f/u with cardiology and OB/gyn recently .  She had labs done at that time which she reports were normal.  Pt inquires about getting an influenza vaccine.  Pt admits this would be her first influenza vaccine in ~10 yrs.  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  Pt denies h/o rxns to vaccines or allergy to eggs.     ROS: See pertinent positives and negatives per HPI.  Past Medical History:  Diagnosis Date  . Asthma   . Diverticulosis   . Elevated C-reactive protein (CRP)   . Esophageal stricture   . GERD (gastroesophageal reflux disease)   . HH (hiatus hernia)   . Hypertension   . MVP (mitral valve prolapse)   . Osteoporosis   . Renal calculus     Past Surgical History:  Procedure Laterality Date  . Broken Elbow Right   . Broken Finger Right    Ring Finger    Family History  Problem Relation Age of Onset  . Breast cancer Maternal Aunt        x 2  . Heart disease Maternal Uncle        multiple  . Heart attack Father   . Anuerysm Mother        aortal  . Colon cancer Neg Hx   . Esophageal cancer Neg Hx   . Liver cancer Neg Hx   . Pancreatic cancer Neg Hx   . Rectal cancer Neg Hx   . Stomach cancer Neg Hx      Current Outpatient Medications:  .   albuterol (VENTOLIN HFA) 108 (90 Base) MCG/ACT inhaler, 2 puffs every 4 hours if needed, Disp: 18 g, Rfl: 5 .  beclomethasone (QVAR REDIHALER) 80 MCG/ACT inhaler, Inhale 2 puffs into the lungs 2 (two) times daily., Disp: 10.6 g, Rfl: 11 .  Ergocalciferol (VITAMIN D2) 400 units TABS, Take [name] tablet by mouth daily., Disp: , Rfl:  .  estradiol (EVAMIST) [name].53 MG/SPRAY transdermal spray, Place 2 sprays onto the skin daily., Disp: , Rfl:  .  potassium chloride SA (KLOR-CON) 20 MEQ tablet, Take [name] tablet (20 mEq total) by mouth daily., Disp: 90 tablet, Rfl: 3 .  Progesterone Micronized (PROMETRIUM PO), Take [name] capsule by mouth daily. 100mg  capsule once daily, Disp: , Rfl:  .  triamterene-hydrochlorothiazide (MAXZIDE-25) 37.5-25 MG tablet, Take [name] tablet by mouth daily., Disp: 90 tablet, Rfl: 3  EXAM:  VITALS per patient if applicable:  RR between 12-20 bpm  GENERAL: alert, oriented, appears well and in no acute distress  HEENT: atraumatic, conjunctiva clear, no obvious abnormalities on inspection of external nose and ears  NECK: normal movements of the head and neck  LUNGS: on inspection no signs of respiratory distress, breathing rate appears normal, no obvious gross SOB, gasping or wheezing  CV: no obvious cyanosis  MS: moves all visible extremities without noticeable abnormality  PSYCH/NEURO: pleasant and cooperative, no obvious depression or anxiety, speech and thought processing grossly intact  ASSESSMENT AND PLAN:  Discussed the following assessment and plan:  Essential hypertension  -stable -continue lifestyle modifications - Plan: triamterene-hydrochlorothiazide (MAXZIDE-25) 37.5-25 MG tablet  Hypokalemia  -will send refill in just in case needed. -will obtain records of recent labs -ok to take Klor-con a few times a wk instead of daily. - Plan: potassium chloride SA (KLOR-CON) 20 MEQ tablet  Pt to RTC on Monday Nov. 30 at 9:30 for influenza vaccine.   I discussed the  assessment and treatment plan with the patient. The patient was provided an opportunity to ask questions and all were answered. The patient agreed with the plan and demonstrated an understanding of the instructions.   The patient was advised to call back or seek an in-person evaluation if the symptoms worsen or if the condition fails to improve as anticipated.   Billie Ruddy, MD

## 2019-05-14 ENCOUNTER — Other Ambulatory Visit: Payer: Self-pay

## 2019-05-14 ENCOUNTER — Ambulatory Visit (INDEPENDENT_AMBULATORY_CARE_PROVIDER_SITE_OTHER): Payer: 59 | Admitting: *Deleted

## 2019-05-14 ENCOUNTER — Encounter: Payer: Self-pay | Admitting: Family Medicine

## 2019-05-14 DIAGNOSIS — Z23 Encounter for immunization: Secondary | ICD-10-CM | POA: Diagnosis not present

## 2019-07-22 ENCOUNTER — Other Ambulatory Visit: Payer: Self-pay | Admitting: Interventional Cardiology

## 2019-07-22 ENCOUNTER — Other Ambulatory Visit: Payer: Self-pay | Admitting: Family Medicine

## 2019-07-22 DIAGNOSIS — E876 Hypokalemia: Secondary | ICD-10-CM

## 2019-07-22 DIAGNOSIS — I10 Essential (primary) hypertension: Secondary | ICD-10-CM

## 2019-11-20 ENCOUNTER — Other Ambulatory Visit: Payer: Self-pay | Admitting: Obstetrics and Gynecology

## 2019-11-20 DIAGNOSIS — Z1231 Encounter for screening mammogram for malignant neoplasm of breast: Secondary | ICD-10-CM

## 2019-12-12 ENCOUNTER — Telehealth: Payer: Self-pay | Admitting: Internal Medicine

## 2019-12-12 MED ORDER — QVAR REDIHALER 80 MCG/ACT IN AERB: 2.0000 | INHALATION_SPRAY | Freq: Two times a day (BID) | RESPIRATORY_TRACT | 5 refills | Status: DC

## 2019-12-12 NOTE — Telephone Encounter (Signed)
Refill of Qvar sent to preferred pharmacy. Patient left detailed message.

## 2019-12-13 ENCOUNTER — Other Ambulatory Visit: Payer: Self-pay | Admitting: Internal Medicine

## 2019-12-13 NOTE — Telephone Encounter (Signed)
Lets try flovent hfa 110  2bid but if not happy after a month let her know we can try to get the qvar approved again

## 2019-12-13 NOTE — Telephone Encounter (Signed)
Spoke with the patient and advised her about the prior auth and the process of trying preferred medications.  I let her know I would get the message to Dr. Melvyn Novas and see if he wanted to try one of the preferred medications and when we heard back from him we would call her back.  She verbalized understanding.  Dr. Melvyn Novas, Patient's pharmacy states that the Qvar is requiring a prior auth, since the patient has not tried/failed 2 preferred medications, would you like to order one of the preferred inhalers?  Arnuity Ellipta, Flovent Diskus, Flovent HFA, Pulmicort Flexihaler.  Please advise.

## 2019-12-14 ENCOUNTER — Ambulatory Visit
Admission: RE | Admit: 2019-12-14 | Discharge: 2019-12-14 | Disposition: A | Discharge status: Home / Self Care | Payer: 59 | Source: Ambulatory Visit | Attending: Obstetrics and Gynecology | Admitting: Obstetrics and Gynecology

## 2019-12-14 ENCOUNTER — Other Ambulatory Visit: Payer: Self-pay

## 2019-12-14 DIAGNOSIS — Z1231 Encounter for screening mammogram for malignant neoplasm of breast: Secondary | ICD-10-CM

## 2019-12-14 MED ORDER — FLOVENT HFA 110 MCG/ACT IN AERO: 2.0000 | INHALATION_SPRAY | Freq: Two times a day (BID) | RESPIRATORY_TRACT | 0 refills | Status: DC

## 2019-12-14 NOTE — Telephone Encounter (Signed)
Called and left detailed message for patient regarding new inhaler prescribed by Dr. Melvyn Novas.   New prescription sent to pharmacy designated by patient.

## 2019-12-18 ENCOUNTER — Telehealth: Payer: Self-pay | Admitting: Internal Medicine

## 2019-12-18 NOTE — Telephone Encounter (Signed)
ATC pt, no answer. Left message for pt to call back.  

## 2019-12-18 NOTE — Telephone Encounter (Signed)
Spoke with the pt  I do not see where anyone called her  I advised that she is due for yearly ov with Dr Melvyn Novas and we scheduled her appt for Sept 2021 Nothing further needed

## 2019-12-20 NOTE — Telephone Encounter (Signed)
Spoke with the pt and notified of recs per Dr Melvyn Novas  She has already picked up flovent and is using it without any problems

## 2019-12-21 ENCOUNTER — Telehealth: Payer: Self-pay | Admitting: Internal Medicine

## 2019-12-21 NOTE — Telephone Encounter (Signed)
Called and left message for patient to return call. Flovent is already on her profile and was filled on 12/14/2019. This could be an old message. Waiting for call.

## 2019-12-21 NOTE — Telephone Encounter (Signed)
flovent 110 hf Take 2 puffs first thing in am and then another 2 puffs about 12 hours later.    will give same benefit and should be tried (if not documented previously via pharmacy filling it) and call if not satisfied and will see what other options her insurance will required before paying for qvar 80

## 2019-12-21 NOTE — Telephone Encounter (Signed)
Received form from CoverMyMeds stating Qvar was not covered. Flovent HFA, Arnuity Ellipta, Flovent Diskus, Alvesco, Symbicort, and Adair Patter are all covered by patient's insurance and do not require PA. Dr. Melvyn Novas please advise.

## 2019-12-24 NOTE — Telephone Encounter (Signed)
Called and spoke with pt to see if she was on Flovent as it is on  Her med list as well as Qvar. Pt stated that she was switched to Flovent by MW 7/2 since insurance was not covering Qvar. Removed Qvar off of pt's med list. Nothing further needed.

## 2020-01-08 ENCOUNTER — Telehealth: Payer: Self-pay | Admitting: Internal Medicine

## 2020-01-08 ENCOUNTER — Other Ambulatory Visit: Payer: Self-pay | Admitting: Internal Medicine

## 2020-01-08 MED ORDER — FLOVENT HFA 110 MCG/ACT IN AERO: 2.0000 | INHALATION_SPRAY | Freq: Two times a day (BID) | RESPIRATORY_TRACT | 2 refills | Status: DC

## 2020-01-08 NOTE — Telephone Encounter (Signed)
rx for flovent refilled to last until her next appt  Left her a detailed msg letting her know

## 2020-03-11 ENCOUNTER — Encounter: Payer: Self-pay | Admitting: Internal Medicine

## 2020-03-11 ENCOUNTER — Ambulatory Visit: Payer: 59 | Admitting: Internal Medicine

## 2020-03-11 ENCOUNTER — Other Ambulatory Visit: Payer: Self-pay

## 2020-03-11 DIAGNOSIS — J4530 Mild persistent asthma, uncomplicated: Secondary | ICD-10-CM

## 2020-03-11 LAB — CBC WITH DIFFERENTIAL/PLATELET
Basophils Absolute: 0.1 10*3/uL (ref 0.0–0.1)
Basophils Relative: 0.9 % (ref 0.0–3.0)
Eosinophils Absolute: 0.3 10*3/uL (ref 0.0–0.7)
Eosinophils Relative: 4.6 % (ref 0.0–5.0)
HCT: 40.1 % (ref 36.0–46.0)
Hemoglobin: 13.3 g/dL (ref 12.0–15.0)
Lymphocytes Relative: 28.9 % (ref 12.0–46.0)
Lymphs Abs: 2.1 10*3/uL (ref 0.7–4.0)
MCHC: 33.2 g/dL (ref 30.0–36.0)
MCV: 88.2 fl (ref 78.0–100.0)
Monocytes Absolute: 0.6 10*3/uL (ref 0.1–1.0)
Monocytes Relative: 8.1 % (ref 3.0–12.0)
Neutro Abs: 4.1 10*3/uL (ref 1.4–7.7)
Neutrophils Relative %: 57.5 % (ref 43.0–77.0)
Platelets: 300 10*3/uL (ref 150.0–400.0)
RBC: 4.55 Mil/uL (ref 3.87–5.11)
RDW: 14.6 % (ref 11.5–15.5)
WBC: 7.1 10*3/uL (ref 4.0–10.5)

## 2020-03-11 MED ORDER — PULMICORT FLEXHALER 180 MCG/ACT IN AEPB: 2.0000 | INHALATION_SPRAY | Freq: Two times a day (BID) | RESPIRATORY_TRACT | 11 refills | Status: DC

## 2020-03-11 NOTE — Progress Notes (Signed)
Subjective:    Patient ID: Angela [name] [facility], female   DOB: 08-Nov-1955    MRN: [id number]    Brief patient profile:  64  yowf never smoker history of chronic asthma as child, then recurred age 64 with one episode life threatening exacerbation and admit to ICU [facility] in 1993 and on maint rx ever since with ICS.    History of Present Illness  01/26/2016  f/u ov/[name] re: chronic asthma  Chief Complaint  Patient presents with  . Follow-up    6 month rov.  needs qvar refill.  denies any complaints today.     some symptoms in [facility]  Heat this summer and with and occ heavy exertion/ chest  Tight always better with saba avg use of saba maybe once a month  rec Plan A = Automatic =  qvar 80 Take 2 puffs first thing in am and then another 2 puffs about 12 hours later if not feeling 100%  Plan B = Backup Only use your albuterol as a rescue medication     11/11/2016  f/u ov/[name] re: cough flare  Chief Complaint  Patient presents with  . Acute Visit    Pt c/o prod cough - pt unsure of color x 2 weeks. Pt denies SOB, CP/tightness, f/c/s.   as instructed qvar 80 2bid and ranitidine once daily but now on estrogen/ progesterone for bone loss issues  Severe coughing fits hourly, worse with voice use, cold liquids/ but occur 24/7 but no sob or increased saba need  rec zpak  Prednisone 10 mg take  4 each am x 2 days,   2 each am x 2 days,  1 each am x 2 days and stop  Take delsym two tsp every 12 hours and supplement if needed with  Roxicodone 5mg   every 4 hours Try prilosec otc 20mg   Take 30-60 min before first meal of the day and Pepcid ac (famotidine) 20 mg one @  bedtime until cough is completely gone for at least a week without the need for cough suppression GERD (REFLUX)    02/21/2017  f/u ov/[name] re: chronic asthma/ hbp on qvar 80  2 each am  Chief Complaint  Patient presents with  . Follow-up    Pt concerned with blood pressure. Overall feeling well, stable with asthma.   needs hfa  once a month at most, rarely needs to take more than 2 puffs of qvar 80 in am but not comfortable with device yet  rec Work on maintaining perfect  inhaler technique:    Maxzide 25 one each am should improve both your blood pressure and your calcium balance Please see patient coordinator before you leave today  to schedule Internal medicine Please schedule a follow up visit in 12 months but call sooner if needed     08/17/2018  f/u ov/[name] re: chronic asthma  Chief Complaint  Patient presents with  . Needs refill of Qvar    No issues, OV needed for prescription, last visit in 2018.   Dyspnea:  Working out at the [employer] no prob  Cough:  Very rare eg dust exp  Sleeping: no disturbance,  bed flat SABA use: none rec Plan A = Automatic = Qvar 40 2 each am and extra 2 puffs if needed  X at least 7 days  Plan B = Backup Only use your albuterol inhaler as a rescue medication    03/11/2020  f/u ov/[name] re:  Chronic asthma / [place] dog in [place]/ new  flovent 110 2 bid as qvar required failure of 2 others on list  Chief Complaint  Patient presents with  . Follow-up    Changed from Qvar to Flovent by insurance. Dry cough in the morning and at night that has recently started.   Dyspnea:  Not with exertion  Cough: some dry cough in am and hs w/in a few weeks of starting flovent  - no longer on ppi due to concerns re bone density  Sleeping: 30 degrees on pillows bed is flat SABA use: none/ makes shaky 02: none    No obvious day to day or daytime variability or assoc excess/ purulent sputum or mucus plugs or hemoptysis or cp or chest tightness, subjective wheeze or overt sinus or hb symptoms.     Also denies any obvious fluctuation of symptoms with weather or environmental changes or other aggravating or alleviating factors except as outlined above   No unusual exposure hx or h/o childhood pna/ asthma or knowledge of premature birth.  Current Allergies, Complete Past Medical History, Past Surgical  History, Family History, and Social History were reviewed in [employer] record.  ROS  The following are not active complaints unless bolded Hoarseness, sore throat, dysphagia, dental problems, itching, sneezing,  nasal congestion or discharge of excess mucus or purulent secretions, ear ache,   fever, chills, sweats, unintended wt loss or wt gain, classically pleuritic or exertional cp,  orthopnea pnd or arm/hand swelling  or leg swelling, presyncope, palpitations, abdominal pain, anorexia, nausea, vomiting, diarrhea  or change in bowel habits or change in bladder habits, change in stools or change in urine, dysuria, hematuria,  rash, arthralgias, visual complaints, headache, numbness, weakness or ataxia or problems with walking or coordination,  change in mood or  memory.        Current Meds  Medication Sig  . albuterol (VENTOLIN HFA) 108 (90 Base) MCG/ACT inhaler 2 puffs every 4 hours if needed  . Ergocalciferol (VITAMIN D2) 400 units TABS Take 1 tablet by mouth daily.  [name] estradiol (EVAMIST) 1.53 MG/SPRAY transdermal spray Place 2 sprays onto the skin daily.  . fluticasone (FLOVENT HFA) 110 MCG/ACT inhaler Inhale 2 puffs into the lungs in the morning and at bedtime.  . potassium chloride SA (KLOR-CON) 20 MEQ tablet Take 1 tablet (20 mEq total) by mouth daily.  . Progesterone Micronized (PROMETRIUM PO) Take 1 capsule by mouth daily. 100mg  capsule once daily  . triamterene-hydrochlorothiazide (MAXZIDE-25) 37.5-25 MG tablet Take 1 tablet by mouth daily.                              Past Medical History:  ASTHMA--chronic mild ...........................................................................Marland KitchenWert  - h/o status asthmaticus/ vent dep 1993 GERD (ICD-530.81)--prev. endo 10/2002--GERD/esophageal stricture HIATAL HERNIA (ICD-553.3)  MITRAL VALVE PROLAPSE (ICD-424.0)  a. echo 4/08: EF 60%. minimal prolapse of posterior leaflet. trivial MR  b. ECH0 4/10: EF  55-60%. mild to moderate MVP with mild MR  Palpitations thought due to SVT  RENAL CALCULUS, HX OF (ICD-V13.01)  HYPERTENSION (ICD-401.9)          Objective:   Physical Exam   amb wf nad    Vital signs reviewed  03/11/2020  - Note at rest 02 sats  95% on RA    03/11/2020    154  Wt 135 04/12/2011 > 129 05/03/12 > 05/15/2013  131 > 05/24/2014  138>143 07/22/2015 > 01/26/2016 152  >  11/11/2016  152 >  02/21/2017 147 > 08/17/2018  148        HEENT : pt wearing mask not removed for exam due to covid -19 concerns.    NECK :  without JVD/Nodes/TM/ nl carotid upstrokes bilaterally   LUNGS: no acc muscle use,  Nl contour chest which is clear to A and P bilaterally without cough on insp or exp maneuvers   CV:  RRR  no s3 or murmur or increase in P2, and no edema   ABD:  soft and nontender with nl inspiratory excursion in the supine position. No bruits or organomegaly appreciated, bowel sounds nl  MS:  Nl gait/ ext warm without deformities, calf tenderness, cyanosis or clubbing No obvious joint restrictions   SKIN: warm and dry without lesions    NEURO:  alert, approp, nl sensorium with  no motor or cerebellar deficits apparent.    Labs ordered 03/11/2020  :  allergy profile           Assessment:

## 2020-03-11 NOTE — Patient Instructions (Signed)
Change pulmocort Take 2 puffs first thing in am and then another 2 puffs about 12 hours later.   If cough persists try Try prilosec otc 20mg   Take 30-60 min before first meal of the day and Pepcid ac (famotidine) 20 mg one @  bedtime until cough is completely gone for at least a week without the need for cough suppression  Please remember to go to the lab department   for your tests - we will call you with the results when they are available.      If still not happy I will do a prior approval for Qvar 40     Please schedule a follow up visit in 12 months but call sooner if needed

## 2020-03-12 ENCOUNTER — Encounter: Payer: Self-pay | Admitting: Internal Medicine

## 2020-03-12 LAB — IGE: IgE (Immunoglobulin E), Serum: 1435 kU/L — ABNORMAL HIGH (ref <=114)

## 2020-03-12 NOTE — Assessment & Plan Note (Signed)
-   PFT's [facility] 07/04/13  FEV1  2.12 (82%) ratio 69 - FENO 01/26/2016  =   27 on qvar 80 2 q am  - FENO 11/11/2016  =   38 on qvar 2 bid during flare > rec Prednisone 10 mg take  4 each am x 2 days,   2 each am x 2 days,  1 each am x 2 days and stop and no change in qvar  - 02/21/2017  After extensive coaching device  effectiveness =    100% with redihaler  - Allergy profile 03/11/2020 >  Eos 0.3 /  IgE  Pending   Since changed qvar to flovent 110 having more cough noct and needs to try /fail one other ICS to get PA for qvar so rec trial of pulmicort 180 2bid   Also concened about dog in bedroom and now off ppi if fails even qvar should it be approved.          Each maintenance medication was reviewed in detail including emphasizing most importantly the difference between maintenance and prns and under what circumstances the prns are to be triggered using an action plan format where appropriate.  Total time for H and P, chart review, counseling, reviewing dpi device and generating customized AVS unique to this office visit / charting = 15 min

## 2020-03-13 ENCOUNTER — Other Ambulatory Visit: Payer: Self-pay | Admitting: Internal Medicine

## 2020-03-13 DIAGNOSIS — J4530 Mild persistent asthma, uncomplicated: Secondary | ICD-10-CM

## 2020-03-13 NOTE — Progress Notes (Signed)
Pt notified of results and I have referred to [facility]

## 2020-05-01 ENCOUNTER — Ambulatory Visit: Payer: 59 | Admitting: Allergy

## 2020-05-06 ENCOUNTER — Other Ambulatory Visit: Payer: Self-pay

## 2020-05-06 ENCOUNTER — Ambulatory Visit: Payer: 59 | Admitting: Allergy and Immunology

## 2020-05-06 ENCOUNTER — Ambulatory Visit
Admission: RE | Admit: 2020-05-06 | Discharge: 2020-05-06 | Disposition: A | Discharge status: Home / Self Care | Payer: 59 | Source: Ambulatory Visit | Attending: Allergy and Immunology | Admitting: Allergy and Immunology

## 2020-05-06 VITALS — BP 128/88 | HR 113 | Temp 98.1°F | Resp 16 | Ht 63.0 in | Wt 158.2 lb

## 2020-05-06 DIAGNOSIS — R059 Cough, unspecified: Secondary | ICD-10-CM

## 2020-05-06 DIAGNOSIS — J4550 Severe persistent asthma, uncomplicated: Secondary | ICD-10-CM | POA: Diagnosis not present

## 2020-05-06 NOTE — Progress Notes (Signed)
[place] [facility] [place] - [place] - [place] - [place]   Dear Dr. Melvyn Novas,  Thank you for referring Angela [name] to the [facility] on 05/06/2020.   Below is a summation of this patient's evaluation and recommendations.  Thank you for your referral. I will keep you informed about this patient's response to treatment.   If you have any questions please do not hesitate to contact me.   Sincerely,  Jiles Prows, MD Allergy / Immunology [place]   ______________________________________________________________________    NEW PATIENT NOTE  Referring Provider: Tanda Rockers, MD Primary Provider: Billie Ruddy, MD Date of office visit: 05/06/2020    Subjective:   Chief Complaint:  Angela [name] (DOB: Apr 02, 1956) is a 64 y.o. female who presents to the clinic on 05/06/2020 with a chief complaint of Asthma (Wheezing and coughing) and Allergic Reaction (PCP says she had a reaction and sent her here) .     HPI: Angela [name] presents to this clinic in evaluation of asthma.  She has a several decade long history of asthma that she believes is under relatively good control ever since she has been consistently using Pulmicort 180 - 2 inhalations twice a day on a consistent basis.  While utilizing this plan she rarely uses a short acting bronchodilator and does not appear to have any cold air induced bronchospastic symptoms and is able to exercise 5 times per week.  She may be a little bit restricted in her ability to breathe sometimes when she exerts herself to a very high extent but overall this does not appear to interfere with her quality of life.  She has not required a systemic steroid or an antibiotic to treat any type of airway issue in years.  Provoking factors for her asthma include exposure to cats and cows and there is some specific flowers that give rise to a problem with her  breathing.  She has no associated nasal symptoms.  She has no anosmia.  She has no other atopic symptomatology.  She does have some intermittent reflux for which she will occasionally take some Gaviscon.  This is a lower abdominal indigestion and there is no high chest heartburn or regurgitation.  She does not drink any caffeine although she has chocolate on most days and she does drink wine with dinner.  Past Medical History:  Diagnosis Date   Asthma    Diverticulosis    Elevated C-reactive protein (CRP)    Esophageal stricture    GERD (gastroesophageal reflux disease)    HH (hiatus hernia)    Hypertension    MVP (mitral valve prolapse)    Osteoporosis    Renal calculus     Past Surgical History:  Procedure Laterality Date   Broken Elbow Right    Broken Finger Right    Ring Finger    Allergies as of 05/06/2020      Reactions   Codeine Nausea And Vomiting   Other Other (See Comments)   BP RISE      Medication List      albuterol 108 (90 Base) MCG/ACT inhaler Commonly known as: Ventolin HFA 2 puffs every 4 hours if needed   estradiol 1.53 MG/SPRAY transdermal spray Commonly known as: EVAMIST Place 2 sprays onto the skin daily.   potassium chloride SA 20 MEQ tablet Commonly known as: KLOR-CON Take 1 tablet (20 mEq total) by mouth daily.  PROMETRIUM PO Take 1 capsule by mouth daily. 100mg  capsule once daily   Pulmicort Flexhaler 180 MCG/ACT inhaler Generic drug: budesonide Inhale 2 puffs into the lungs in the morning and at bedtime.   triamterene-hydrochlorothiazide 37.5-25 MG tablet Commonly known as: MAXZIDE-25 Take 1 tablet by mouth daily.   Vitamin D2 10 MCG (400 UNIT) Tabs Take 1 tablet by mouth daily.       Review of systems negative except as noted in HPI / PMHx or noted below:  Review of Systems  Constitutional: Negative.   HENT: Negative.   Eyes: Negative.   Respiratory: Negative.   Cardiovascular: Negative.     Gastrointestinal: Negative.   Genitourinary: Negative.   Musculoskeletal: Negative.   Skin: Negative.   Neurological: Negative.   Endo/Heme/Allergies: Negative.   Psychiatric/Behavioral: Negative.     Family History  Problem Relation Age of Onset   Breast cancer Maternal Aunt        x 2   Heart disease Maternal Uncle        multiple   Heart attack Father    Anuerysm Mother        aortal   Colon cancer Neg Hx    Esophageal cancer Neg Hx    Liver cancer Neg Hx    Pancreatic cancer Neg Hx    Rectal cancer Neg Hx    Stomach cancer Neg Hx     Social History   Socioeconomic History   Marital status: Married    Spouse name: Not on file   Number of children: 0   Years of education: Not on file   Highest education level: Not on file  Occupational History   Occupation: ATTORNEY    Employer: [employer]  Tobacco Use   Smoking status: Never Smoker   Smokeless tobacco: Never Used  Scientific laboratory technician Use: Never used  Substance and Sexual Activity   Alcohol use: Yes    Comment: 3 times a week   Drug use: No   Sexual activity: Not on file  Other Topics Concern   Not on file  Social History Narrative   Not on file    Environmental and Social history  Lives in a house with a dry environment, dog located inside the household, carpet in the bedroom, no plastic on the bed, no plastic on the pillow, no smoking okay inside the household.  She is an Forensic psychologist.  Objective:   Vitals:   05/06/20 1416  BP: 128/88  Pulse: (!) 113  Resp: 16  Temp: 98.1 F (36.7 C)  SpO2: 96%   Height: 5\' 3"  (160 cm) Weight: 158 lb 3.2 oz (71.8 kg)  Physical Exam Constitutional:      Appearance: She is not diaphoretic.  HENT:     Head: Normocephalic.     Right Ear: Tympanic membrane, ear canal and external ear normal.     Left Ear: Tympanic membrane, ear canal and external ear normal.     Nose: Nose normal. No mucosal edema or rhinorrhea.     Mouth/Throat:      Pharynx: Uvula midline. No oropharyngeal exudate.  Eyes:     Conjunctiva/sclera: Conjunctivae normal.  Neck:     Thyroid: No thyromegaly.     Trachea: Trachea normal. No tracheal tenderness or tracheal deviation.  Cardiovascular:     Rate and Rhythm: Normal rate and regular rhythm.     Heart sounds: Normal heart sounds, S1 normal and S2 normal. No murmur heard.   Pulmonary:  Effort: No respiratory distress.     Breath sounds: Normal breath sounds. No stridor. No wheezing or rales.  Lymphadenopathy:     Head:     Right side of head: No tonsillar adenopathy.     Left side of head: No tonsillar adenopathy.     Cervical: No cervical adenopathy.  Skin:    Findings: No erythema or rash.     Nails: There is no clubbing.  Neurological:     Mental Status: She is alert.     Diagnostics: Allergy skin tests were not performed.   Spirometry was performed and demonstrated an FEV1 of 1.74 @ 75 % of predicted. FEV1/FVC = 0.74.  Results of blood tests obtained 11 March 2020 identifies IgE 1435 KU/L, WBC 7.1, absolute eosinophil 300, absolute lymphocyte 2100, hemoglobin 13.1, platelet 300   Assessment and Plan:    1. Asthma, severe persistent, well-controlled     1.  Blood - Area 2 aeroallergen profile, CBC w/d, alpha 1 antitrypsin level and phenotype  2.  Obtain Chest X-ray  3.  Continue Pulmicort and albuterol as previously prescribed  4.  Further evaluation and treatment?  Angela [name] appears to have some discordance regarding her spirometric evaluation and her clinical control of asthma and she has had a drop in her lung function this decade.  Her FEV1 percent predicted in 2015 was 82%.  There does appear to be a drop in expiratory airflow over the course of the past 6 years.  This may be a manifestation of continued inflammation in her airway and we will further evaluate that issue with the blood test noted above and to be complete check an alpha-1 antitrypsin level and  phenotype.  And her last chest x-ray was over 5 years ago and will repeat that study today.  I am not going to change any of her therapy at this point until we have the results of her diagnostic testing available for review.  Jiles Prows, MD Allergy / Immunology [facility]

## 2020-05-06 NOTE — Patient Instructions (Addendum)
  1.  Blood - Area 2 aeroallergen profile, CBC w/d, alpha 1 antitrypsin level and phenotype  2.  Obtain Chest X-ray  3.  Continue Pulmicort and albuterol as previously prescribed  4.  Further evaluation and treatment?

## 2020-05-07 ENCOUNTER — Encounter: Payer: Self-pay | Admitting: Allergy and Immunology

## 2020-05-08 LAB — CBC WITH DIFFERENTIAL
Basophils Absolute: 0.1 10*3/uL (ref 0.0–0.2)
Basos: 1 %
EOS (ABSOLUTE): 0.3 10*3/uL (ref 0.0–0.4)
Eos: 4 %
Hematocrit: 44.2 % (ref 34.0–46.6)
Hemoglobin: 14.5 g/dL (ref 11.1–15.9)
Immature Grans (Abs): 0 10*3/uL (ref 0.0–0.1)
Immature Granulocytes: 0 %
Lymphocytes Absolute: 3.4 10*3/uL — ABNORMAL HIGH (ref 0.7–3.1)
Lymphs: 40 %
MCH: 28.5 pg (ref 26.6–33.0)
MCHC: 32.8 g/dL (ref 31.5–35.7)
MCV: 87 fL (ref 79–97)
Monocytes Absolute: 0.7 10*3/uL (ref 0.1–0.9)
Monocytes: 8 %
Neutrophils Absolute: 4 10*3/uL (ref 1.4–7.0)
Neutrophils: 47 %
RBC: 5.09 x10E6/uL (ref 3.77–5.28)
RDW: 14.1 % (ref 11.7–15.4)
WBC: 8.5 10*3/uL (ref 3.4–10.8)

## 2020-05-08 LAB — ALLERGENS W/TOTAL IGE AREA 2
Alternaria Alternata IgE: 2.24 kU/L — AB
Aspergillus Fumigatus IgE: 16.1 kU/L — AB
Bermuda Grass IgE: 1.06 kU/L — AB
Cat Dander IgE: 36.7 kU/L — AB
Cedar, Mountain IgE: 0.74 kU/L — AB
Cladosporium Herbarum IgE: 0.79 kU/L — AB
Cockroach, German IgE: 0.62 kU/L — AB
Common Silver Birch IgE: 0.34 kU/L — AB
Cottonwood IgE: 0.59 kU/L — AB
D Farinae IgE: 0.72 kU/L — AB
D Pteronyssinus IgE: 0.88 kU/L — AB
Dog Dander IgE: 56.8 kU/L — AB
Elm, American IgE: 0.81 kU/L — AB
IgE (Immunoglobulin E), Serum: 1987 IU/mL — ABNORMAL HIGH (ref 6–495)
Johnson Grass IgE: 0.92 kU/L — AB
Maple/Box Elder IgE: 0.92 kU/L — AB
Mouse Urine IgE: 0.47 kU/L — AB
Oak, White IgE: 0.99 kU/L — AB
Pecan, Hickory IgE: 1.62 kU/L — AB
Penicillium Chrysogen IgE: 3.96 kU/L — AB
Pigweed, Rough IgE: 0.51 kU/L — AB
Ragweed, Short IgE: 1.12 kU/L — AB
Sheep Sorrel IgE Qn: 0.75 kU/L — AB
Timothy Grass IgE: 1.27 kU/L — AB
White Mulberry IgE: 0.34 kU/L — AB

## 2020-05-08 LAB — ALPHA-1 ANTITRYPSIN PHENOTYPE: A-1 Antitrypsin: 151 mg/dL (ref 101–187)

## 2020-06-24 ENCOUNTER — Ambulatory Visit: Payer: 59 | Admitting: Allergy and Immunology

## 2020-06-24 ENCOUNTER — Encounter: Payer: Self-pay | Admitting: Allergy and Immunology

## 2020-06-24 ENCOUNTER — Other Ambulatory Visit: Payer: Self-pay

## 2020-06-24 VITALS — BP 126/78 | HR 85 | Temp 97.6°F | Resp 16 | Ht 64.0 in | Wt 160.0 lb

## 2020-06-24 DIAGNOSIS — J4550 Severe persistent asthma, uncomplicated: Secondary | ICD-10-CM | POA: Diagnosis not present

## 2020-06-24 NOTE — Patient Instructions (Addendum)
  1.  Start samples of Breztri - 2 inhalations 2 times per day with spacer  2.  Continue Albuterol HFA if needed  3. Return to clinic in 4 weeks.  4. Further treatment?

## 2020-06-24 NOTE — Progress Notes (Signed)
[place] - [place] - [place]   Follow-up Note  Referring Provider: Billie Ruddy, MD Primary Provider: Billie Ruddy, MD Date of Office Visit: 06/24/2020  Subjective:   Angela [name] (DOB: [birth year]) is a 65 y.o. female who returns to the [facility] [place] on 06/24/2020 in re-evaluation of the following:  HPI: Angela [name] returns to this clinic in evaluation of asthma.  I last saw her in this clinic during her initial evaluation on 06 May 2020.  She has performed allergen avoidance measures as best as possible against dog.  The dog is now in a crate away from her bed and there is a HEPA filter inside the bedroom.  She continues to consistently use Pulmicort.  She does fine unless she exerts herself to an high aerobic level.  She does exercise about 5 times per week but she finds that she is out of breath and cannot really reach the potential that she believes she should be able to given her current status.  Allergies as of 06/24/2020      Reactions   Codeine Nausea And Vomiting   Other Other (See Comments)   BP RISE      Medication List      albuterol 108 (90 Base) MCG/ACT inhaler Commonly known as: Ventolin HFA 2 puffs every 4 hours if needed   estradiol 1.53 MG/SPRAY transdermal spray Commonly known as: EVAMIST Place 2 sprays onto the skin daily.   potassium chloride SA 20 MEQ tablet Commonly known as: KLOR-CON Take 1 tablet (20 mEq total) by mouth daily.   PROMETRIUM PO Take 1 capsule by mouth daily. 100mg  capsule once daily   Pulmicort Flexhaler 180 MCG/ACT inhaler Generic drug: budesonide Inhale 2 puffs into the lungs in the morning and at bedtime.   triamterene-hydrochlorothiazide 37.5-25 MG tablet Commonly known as: MAXZIDE-25 Take 1 tablet by mouth daily.   Vitamin D2 10 MCG (400 UNIT) Tabs Take 1 tablet by mouth daily.       Past Medical History:  Diagnosis Date  . Asthma   . Diverticulosis   .  Elevated C-reactive protein (CRP)   . Esophageal stricture   . GERD (gastroesophageal reflux disease)   . HH (hiatus hernia)   . Hypertension   . MVP (mitral valve prolapse)   . Osteoporosis   . Renal calculus     Past Surgical History:  Procedure Laterality Date  . Broken Elbow Right   . Broken Finger Right    Ring Finger    Review of systems negative except as noted in HPI / PMHx or noted below:  Review of Systems  Constitutional: Negative.   HENT: Negative.   Eyes: Negative.   Respiratory: Negative.   Cardiovascular: Negative.   Gastrointestinal: Negative.   Genitourinary: Negative.   Musculoskeletal: Negative.   Skin: Negative.   Neurological: Negative.   Endo/Heme/Allergies: Negative.   Psychiatric/Behavioral: Negative.      Objective:   Vitals:   06/24/20 1519  BP: 126/78  Pulse: 85  Resp: 16  Temp: 97.6 F (36.4 C)  SpO2: 96%   Height: 5\' 4"  (162.6 cm)  Weight: 160 lb (72.6 kg)   Physical Exam Constitutional:      Appearance: She is not diaphoretic.  HENT:     Head: Normocephalic.     Right Ear: Tympanic membrane, ear canal and external ear normal.     Left Ear: Tympanic membrane, ear canal and external ear normal.  Nose: Nose normal. No mucosal edema or rhinorrhea.     Mouth/Throat:     Mouth: Oropharynx is clear and moist and mucous membranes are normal.     Pharynx: Uvula midline. No oropharyngeal exudate.  Eyes:     Conjunctiva/sclera: Conjunctivae normal.  Neck:     Thyroid: No thyromegaly.     Trachea: Trachea normal. No tracheal tenderness or tracheal deviation.  Cardiovascular:     Rate and Rhythm: Normal rate and regular rhythm.     Heart sounds: Normal heart sounds, S1 normal and S2 normal. No murmur heard.   Pulmonary:     Effort: No respiratory distress.     Breath sounds: Normal breath sounds. No stridor. No wheezing or rales.  Musculoskeletal:        General: No edema.  Lymphadenopathy:     Head:     Right side of  head: No tonsillar adenopathy.     Left side of head: No tonsillar adenopathy.     Cervical: No cervical adenopathy.  Skin:    Findings: No erythema or rash.     Nails: There is no clubbing.  Neurological:     Mental Status: She is alert.     Diagnostics:    Spirometry was performed and demonstrated an FEV1 of 1.60 at 63 % of predicted.  Results of the chest x-ray obtained 06 May 2020 identified the following:  Heart and mediastinal contours are within normal limits. No focal opacities or effusions. No acute bony abnormality.  Results of blood test obtained 06 May 2020 identified alpha-1 antitrypsin 151 mg/DL with MM phenotype, IgE 1987 KU/L, high levels of IgE antibodies directed against both dog and cat and Aspergillus with dog being 56.8 KU/L, cat being 36.70 KU/L, Aspergillus 16.10 KU/L.  She also had low levels of IgE antibodies directed against multiple pollens and dust mite and mold, WBC 8.5, absolute eosinophil 300, absolute lymphocyte 3400, hemoglobin 14.5  Assessment and Plan:   1. Not well controlled severe persistent asthma     1.  Start samples of Breztri - 2 inhalations 2 times per day with spacer  2.  Continue Albuterol HFA if needed  3. Return to clinic in 4 weeks.  4. Further treatment?  Angela [name] certainly has a rather significant deficit in her lung function which is probably a function of her atopic respiratory disease.  She is very allergic and we will get her to perform allergen avoidance measures as best as possible.  I am going to start her on a triple inhaler for the next 4 weeks to see if this makes a difference for her regarding her functionality.  For Angela [name], the issue to assess is her ability to exercise at a high aerobic level.  She has a limitation to do that at this point in time and will use her ability to obtain this high aerobic level as an indicator of whether or not our therapy is working.  We should have a good idea whether or not a triple  inhaler over the course of the next 4 weeks will be making a big difference.  Pending her response we will consider further evaluation and treatment.  Allena Katz, MD Allergy / Immunology [place]

## 2020-06-25 ENCOUNTER — Encounter: Payer: Self-pay | Admitting: Allergy and Immunology

## 2020-06-26 ENCOUNTER — Telehealth: Payer: Self-pay | Admitting: Allergy and Immunology

## 2020-06-26 NOTE — Telephone Encounter (Signed)
Patient assist. Called and made her appointment for march 1,2022. And needs another sample of the [place]. [phone]

## 2020-06-26 NOTE — Telephone Encounter (Signed)
Called and spoke to patient and she expressed that she did not have enough samples to last her until her next appointment. I informed patient that when runs out she can come to the office and if we still have samples we would provide her with more. Patient expressed understanding and was very appreciative.

## 2020-07-08 ENCOUNTER — Other Ambulatory Visit: Payer: Self-pay | Admitting: Family Medicine

## 2020-07-08 DIAGNOSIS — E876 Hypokalemia: Secondary | ICD-10-CM

## 2020-07-08 DIAGNOSIS — I10 Essential (primary) hypertension: Secondary | ICD-10-CM

## 2020-08-12 ENCOUNTER — Other Ambulatory Visit: Payer: Self-pay

## 2020-08-12 ENCOUNTER — Ambulatory Visit (INDEPENDENT_AMBULATORY_CARE_PROVIDER_SITE_OTHER): Payer: 59 | Admitting: Allergy and Immunology

## 2020-08-12 VITALS — BP 110/60 | HR 86 | Temp 97.2°F | Resp 18 | Ht 64.0 in

## 2020-08-12 DIAGNOSIS — J4550 Severe persistent asthma, uncomplicated: Secondary | ICD-10-CM

## 2020-08-12 MED ORDER — BREZTRI AEROSPHERE 160-9-4.8 MCG/ACT IN AERO: 2.0000 | INHALATION_SPRAY | Freq: Two times a day (BID) | RESPIRATORY_TRACT | 5 refills | Status: DC

## 2020-08-12 NOTE — Patient Instructions (Addendum)
  1.  Continue Breztri - 2 inhalations 1-2 times per day with spacer  2.  Continue Albuterol HFA if needed  3.  Consider a course of immunotherapy  4.  Return to clinic in 6 months or earlier if needed

## 2020-08-12 NOTE — Progress Notes (Signed)
[place] - [place] - [place]   Follow-up Note  Referring Provider: Billie Ruddy, MD Primary Provider: Billie Ruddy, MD Date of Office Visit: 08/12/2020  Subjective:   Angela [name] (DOB: 1955-11-16) is a 65 y.o. female who returns to the [facility] [place] on 08/12/2020 in re-evaluation of the following:  HPI: Angela [name] returns to this clinic in reevaluation of asthma.  I last saw in his clinic on 24 June 2020.  She has noticed a significant improvement while using a triple inhaler regarding her exercise capacity which we started during her last visit.  Using a combination of allergen avoidance measures, especially against dog, and utilizing her anti-inflammatory medications for airway inflammation, she believes that she has really done well with her asthma.  Allergies as of 08/12/2020      Reactions   Codeine Nausea And Vomiting   Other Other (See Comments)   BP RISE      Medication List      albuterol 108 (90 Base) MCG/ACT inhaler Commonly known as: Ventolin HFA 2 puffs every 4 hours if needed   Breztri Aerosphere 160-9-4.8 MCG/ACT Aero Generic drug: Budeson-Glycopyrrol-Formoterol Inhale 2 puffs into the lungs in the morning and at bedtime. Started by: Jiles Prows, MD   estradiol 1.53 MG/SPRAY transdermal spray Commonly known as: EVAMIST Place 2 sprays onto the skin daily.   potassium chloride SA 20 MEQ tablet Commonly known as: KLOR-CON TAKE 1 TABLET(20 MEQ) BY MOUTH DAILY   PROMETRIUM PO Take 1 capsule by mouth daily. 100mg  capsule once daily   progesterone 100 MG capsule Commonly known as: PROMETRIUM Take 100 mg by mouth at bedtime.   Pulmicort Flexhaler 180 MCG/ACT inhaler Generic drug: budesonide Inhale 2 puffs into the lungs in the morning and at bedtime.   triamterene-hydrochlorothiazide 37.5-25 MG tablet Commonly known as: MAXZIDE-25 TAKE 1 TABLET BY MOUTH DAILY   Vitamin D2 10 MCG (400 UNIT)  Tabs Take 1 tablet by mouth daily.       Past Medical History:  Diagnosis Date  . Asthma   . Diverticulosis   . Elevated C-reactive protein (CRP)   . Esophageal stricture   . GERD (gastroesophageal reflux disease)   . HH (hiatus hernia)   . Hypertension   . MVP (mitral valve prolapse)   . Osteoporosis   . Renal calculus     Past Surgical History:  Procedure Laterality Date  . Broken Elbow Right   . Broken Finger Right    Ring Finger    Review of systems negative except as noted in HPI / PMHx or noted below:  Review of Systems  Constitutional: Negative.   HENT: Negative.   Eyes: Negative.   Respiratory: Negative.   Cardiovascular: Negative.   Gastrointestinal: Negative.   Genitourinary: Negative.   Musculoskeletal: Negative.   Skin: Negative.   Neurological: Negative.   Endo/Heme/Allergies: Negative.   Psychiatric/Behavioral: Negative.      Objective:   Vitals:   08/12/20 1605  BP: 110/60  Pulse: 86  Resp: 18  Temp: (!) 97.2 F (36.2 C)  SpO2: 96%   Height: 5\' 4"  (162.6 cm)      Physical Exam Constitutional:      Appearance: She is not diaphoretic.  HENT:     Head: Normocephalic.     Right Ear: Tympanic membrane, ear canal and external ear normal.     Left Ear: Tympanic membrane, ear canal and external ear normal.  Nose: Nose normal. No mucosal edema or rhinorrhea.     Mouth/Throat:     Mouth: Oropharynx is clear and moist and mucous membranes are normal.     Pharynx: Uvula midline. No oropharyngeal exudate.  Eyes:     Conjunctiva/sclera: Conjunctivae normal.  Neck:     Thyroid: No thyromegaly.     Trachea: Trachea normal. No tracheal tenderness or tracheal deviation.  Cardiovascular:     Rate and Rhythm: Normal rate and regular rhythm.     Heart sounds: Normal heart sounds, S1 normal and S2 normal. No murmur heard.   Pulmonary:     Effort: No respiratory distress.     Breath sounds: Normal breath sounds. No stridor. No wheezing or  rales.  Musculoskeletal:        General: No edema.  Lymphadenopathy:     Head:     Right side of head: No tonsillar adenopathy.     Left side of head: No tonsillar adenopathy.     Cervical: No cervical adenopathy.  Skin:    Findings: No erythema or rash.     Nails: There is no clubbing.  Neurological:     Mental Status: She is alert.     Diagnostics:    Spirometry was performed and demonstrated an FEV1 of 2.06 at 83 % of predicted.  Her previous FEV1 was 1.60.  The patient had an Asthma Control Test with the following results: ACT Total Score: 25.    Assessment and Plan:   1. Asthma, severe persistent, well-controlled     1.  Continue Breztri - 2 inhalations 1-2 times per day with spacer  2.  Continue Albuterol HFA if needed  3.  Consider a course of immunotherapy  4.  Return to clinic in 6 months or earlier if needed  Angela [name] appears to be doing quite well.  She is interested in a long-term approach to her atopic respiratory disease and she is considering starting a course of immunotherapy.  I have encouraged her to attempt using her triple inhaler just 1 time per day and if this is inadequate in controlling her asthma she can always go back to twice a day.  Assuming she does well with this plan I will see her back in this clinic in 6 months or earlier if there is a problem.  Allena Katz, MD Allergy / Immunology [place]

## 2020-08-13 ENCOUNTER — Encounter: Payer: Self-pay | Admitting: Allergy and Immunology

## 2020-08-20 ENCOUNTER — Other Ambulatory Visit: Payer: Self-pay | Admitting: Allergy and Immunology

## 2020-08-20 DIAGNOSIS — J4550 Severe persistent asthma, uncomplicated: Secondary | ICD-10-CM

## 2020-08-20 DIAGNOSIS — J3081 Allergic rhinitis due to animal (cat) (dog) hair and dander: Secondary | ICD-10-CM | POA: Diagnosis not present

## 2020-08-20 NOTE — Progress Notes (Signed)
Aeroallergen Immunotherapy    Patient Details  Name: Angela [name]  MRN: [id number]  Date of Birth: Nov 30, 1955   Order one of two   Vial Label: cat / dog / pollen   0.1 ml (Volume) BAU Concentration -- 7 Grass Mix* 100,000 ([address] [place], [place], [place], Perennial [place], Red[place], Sweet Vernal, Timothy)  0.1 ml (Volume) BAU Concentration -- [place] 10,000  0.1 ml (Volume) 1:20 Concentration -- [place]  0.1 ml (Volume) 1:20 Concentration -- Ragweed Mix  0.1 ml (Volume) 1:20 Concentration -- Weed Mix*  0.1 ml (Volume) 1:20 Concentration -- Eastern 10 Tree Mix (also Sweet Gum)  0.5 ml (Volume) 1:10 Concentration -- Cat Hair  0.5 ml (Volume) 1:10 Concentration -- Dog Epithelia    1.6 ml Extract Subtotal  3.4 ml Diluent  5.0 ml Maintenance Total    Final Concentration above is stated in weight/volume (wt/vol). Allergen units (AU/ml) biological units (BAU/ml). The total volume is 5 ml.    Schedule: B   Special Instructions: 1-2 times per week

## 2020-08-20 NOTE — Progress Notes (Signed)
Aeroallergen Immunotherapy    Patient Details  Name: Angela [name]  MRN: [id number]  Date of Birth: 22-May-1956   Order two of two   Vial Label: dust mite / mold   0.1 ml (Volume) 1:20 Concentration -- Alternaria alternata  0.1 ml (Volume) 1:10 Concentration -- Aspergillus mix  0.5 ml (Volume)  AU Concentration -- Mite Mix (DF 5,000 & DP 5,000)    0.7 ml Extract Subtotal  3.3 ml Diluent  5.0 ml Maintenance Total    Final Concentration above is stated in weight/volume (wt/vol). Allergen units (AU/ml) biological units (BAU/ml). The total volume is 5 ml.    Schedule: B   Special Instructions: 1-2 times per week

## 2020-08-20 NOTE — Progress Notes (Addendum)
VIALS EXP 08-20-21. ADDITIONAL LABELS FOR BILLING.

## 2020-08-26 DIAGNOSIS — J3089 Other allergic rhinitis: Secondary | ICD-10-CM

## 2020-09-01 ENCOUNTER — Ambulatory Visit (INDEPENDENT_AMBULATORY_CARE_PROVIDER_SITE_OTHER): Payer: 59 | Admitting: *Deleted

## 2020-09-01 ENCOUNTER — Other Ambulatory Visit: Payer: Self-pay

## 2020-09-01 DIAGNOSIS — J309 Allergic rhinitis, unspecified: Secondary | ICD-10-CM | POA: Diagnosis not present

## 2020-09-01 NOTE — Progress Notes (Signed)
Immunotherapy   Patient Details  Name: Jaylina Ramdass MRN: [id number] Date of Birth: 25-Jun-1955  09/01/2020  Bonner Puna Smet started injections for  CAT-DOG-POLLEN, MITE-MOLD Following schedule: B  Frequency:1 time per week Epi-Pen:Epi-Pen Available  Consent signed and patient instructions given.  Patient started allergy injections today, she receieved 0.73mL of CAT-DOG-POLLEN in the RUA and 0.37mL of DMITE-MOLD in the LUA. Patient waited 30 minutes without issues.    Saher Davee Fernandez-Vernon 09/01/2020, 3:12 PM

## 2020-09-11 ENCOUNTER — Ambulatory Visit (INDEPENDENT_AMBULATORY_CARE_PROVIDER_SITE_OTHER): Payer: 59 | Admitting: *Deleted

## 2020-09-11 DIAGNOSIS — J309 Allergic rhinitis, unspecified: Secondary | ICD-10-CM | POA: Diagnosis not present

## 2020-09-11 MED ORDER — EPINEPHRINE 0.3 MG/0.3ML IJ SOAJ: 0.3000 mg | Freq: Once | INTRAMUSCULAR | 1 refills | Status: AC

## 2020-09-15 ENCOUNTER — Ambulatory Visit (INDEPENDENT_AMBULATORY_CARE_PROVIDER_SITE_OTHER): Payer: 59

## 2020-09-15 DIAGNOSIS — J309 Allergic rhinitis, unspecified: Secondary | ICD-10-CM

## 2020-09-25 ENCOUNTER — Ambulatory Visit (INDEPENDENT_AMBULATORY_CARE_PROVIDER_SITE_OTHER): Payer: 59 | Admitting: *Deleted

## 2020-09-25 DIAGNOSIS — J309 Allergic rhinitis, unspecified: Secondary | ICD-10-CM

## 2020-09-30 ENCOUNTER — Ambulatory Visit (INDEPENDENT_AMBULATORY_CARE_PROVIDER_SITE_OTHER): Payer: 59 | Admitting: *Deleted

## 2020-09-30 DIAGNOSIS — J309 Allergic rhinitis, unspecified: Secondary | ICD-10-CM | POA: Diagnosis not present

## 2020-10-06 ENCOUNTER — Ambulatory Visit (INDEPENDENT_AMBULATORY_CARE_PROVIDER_SITE_OTHER): Payer: 59 | Admitting: *Deleted

## 2020-10-06 DIAGNOSIS — J309 Allergic rhinitis, unspecified: Secondary | ICD-10-CM

## 2020-10-15 ENCOUNTER — Ambulatory Visit (INDEPENDENT_AMBULATORY_CARE_PROVIDER_SITE_OTHER): Payer: 59

## 2020-10-15 DIAGNOSIS — J309 Allergic rhinitis, unspecified: Secondary | ICD-10-CM

## 2020-10-24 ENCOUNTER — Ambulatory Visit (INDEPENDENT_AMBULATORY_CARE_PROVIDER_SITE_OTHER): Payer: 59

## 2020-10-24 DIAGNOSIS — J309 Allergic rhinitis, unspecified: Secondary | ICD-10-CM

## 2020-10-30 ENCOUNTER — Other Ambulatory Visit: Payer: Self-pay | Admitting: Obstetrics and Gynecology

## 2020-10-30 DIAGNOSIS — Z1231 Encounter for screening mammogram for malignant neoplasm of breast: Secondary | ICD-10-CM

## 2020-10-31 ENCOUNTER — Ambulatory Visit (INDEPENDENT_AMBULATORY_CARE_PROVIDER_SITE_OTHER): Payer: 59

## 2020-10-31 DIAGNOSIS — J309 Allergic rhinitis, unspecified: Secondary | ICD-10-CM | POA: Diagnosis not present

## 2020-11-04 ENCOUNTER — Ambulatory Visit (INDEPENDENT_AMBULATORY_CARE_PROVIDER_SITE_OTHER): Payer: 59 | Admitting: *Deleted

## 2020-11-04 DIAGNOSIS — J309 Allergic rhinitis, unspecified: Secondary | ICD-10-CM | POA: Diagnosis not present

## 2020-11-13 ENCOUNTER — Ambulatory Visit (INDEPENDENT_AMBULATORY_CARE_PROVIDER_SITE_OTHER): Payer: 59 | Admitting: *Deleted

## 2020-11-13 DIAGNOSIS — J309 Allergic rhinitis, unspecified: Secondary | ICD-10-CM | POA: Diagnosis not present

## 2020-11-19 ENCOUNTER — Ambulatory Visit (INDEPENDENT_AMBULATORY_CARE_PROVIDER_SITE_OTHER): Payer: 59

## 2020-11-19 DIAGNOSIS — J309 Allergic rhinitis, unspecified: Secondary | ICD-10-CM | POA: Diagnosis not present

## 2020-11-25 ENCOUNTER — Ambulatory Visit (INDEPENDENT_AMBULATORY_CARE_PROVIDER_SITE_OTHER): Payer: 59 | Admitting: *Deleted

## 2020-11-25 DIAGNOSIS — J309 Allergic rhinitis, unspecified: Secondary | ICD-10-CM

## 2020-12-03 ENCOUNTER — Ambulatory Visit (INDEPENDENT_AMBULATORY_CARE_PROVIDER_SITE_OTHER): Payer: 59

## 2020-12-03 DIAGNOSIS — J309 Allergic rhinitis, unspecified: Secondary | ICD-10-CM | POA: Diagnosis not present

## 2020-12-05 ENCOUNTER — Telehealth (INDEPENDENT_AMBULATORY_CARE_PROVIDER_SITE_OTHER): Payer: 59 | Admitting: Family Medicine

## 2020-12-05 ENCOUNTER — Other Ambulatory Visit: Payer: Self-pay

## 2020-12-05 DIAGNOSIS — U071 COVID-19: Secondary | ICD-10-CM | POA: Diagnosis not present

## 2020-12-05 NOTE — Progress Notes (Signed)
Angela [name] [facility], female   DOB: [birth year], 65 y.o.   MRN: [id number]  This visit type was conducted due to national recommendations for restrictions regarding the COVID-19 pandemic in an effort to limit this patient's exposure and mitigate transmission in our community.   Virtual Visit via Video Note  I connected with Angela [name] on 12/05/20 at  2:15 PM EDT by a video enabled telemedicine application and verified that I am speaking with the correct person using two identifiers.  Location patient: home Location provider:work or home office Persons participating in the virtual visit: patient, provider  I discussed the limitations of evaluation and management by telemedicine and the availability of in person appointments. The patient expressed understanding and agreed to proceed.   HPI:  Angela [name] has COVID-19.  She states she went to a convention last week and on Tuesday of this week developed some watery eyes.  She had negative COVID test Wednesday and then again yesterday and then this morning tested positive.  She had some mild sore throat.  Rare cough.  No dyspnea.  Very mild headache.  Still been exercising this week.  She does have asthma which is well controlled with Breztri.  She is not aware of any obvious wheezing.  She has had Moderna vaccine including boosters on 12 November and 28 April   ROS: See pertinent positives and negatives per HPI.  Past Medical History:  Diagnosis Date   Asthma    Diverticulosis    Elevated C-reactive protein (CRP)    Esophageal stricture    GERD (gastroesophageal reflux disease)    HH (hiatus hernia)    Hypertension    MVP (mitral valve prolapse)    Osteoporosis    Renal calculus     Past Surgical History:  Procedure Laterality Date   Broken Elbow Right    Broken Finger Right    Ring Finger    Family History  Problem Relation Age of Onset   Breast cancer Maternal Aunt        x 2   Heart disease Maternal Uncle         multiple   Heart attack Father    Anuerysm Mother        aortal   Colon cancer Neg Hx    Esophageal cancer Neg Hx    Liver cancer Neg Hx    Pancreatic cancer Neg Hx    Rectal cancer Neg Hx    Stomach cancer Neg Hx     SOCIAL HX: Non-smoker   Current Outpatient Medications:    albuterol (VENTOLIN HFA) 108 (90 Base) MCG/ACT inhaler, 2 puffs every 4 hours if needed, Disp: 18 g, Rfl: 5   Budeson-Glycopyrrol-Formoterol (BREZTRI AEROSPHERE) 160-9-4.8 MCG/ACT AERO, Inhale 2 puffs into the lungs in the morning and at bedtime., Disp: 10.7 g, Rfl: 5   budesonide (PULMICORT FLEXHALER) 180 MCG/ACT inhaler, Inhale 2 puffs into the lungs in the morning and at bedtime., Disp: 1 each, Rfl: 11   Ergocalciferol (VITAMIN D2) 400 units TABS, Take 1 tablet by mouth daily., Disp: , Rfl:    estradiol (EVAMIST) 1.53 MG/SPRAY transdermal spray, Place 2 sprays onto the skin daily., Disp: , Rfl:    potassium chloride SA (KLOR-CON) 20 MEQ tablet, TAKE 1 TABLET(20 MEQ) BY MOUTH DAILY, Disp: 90 tablet, Rfl: 3   progesterone (PROMETRIUM) 100 MG capsule, Take 100 mg by mouth at bedtime., Disp: , Rfl:    Progesterone Micronized (PROMETRIUM PO), Take 1 capsule by mouth daily. 100mg   capsule once daily, Disp: , Rfl:    triamterene-hydrochlorothiazide (MAXZIDE-25) 37.5-25 MG tablet, TAKE 1 TABLET BY MOUTH DAILY, Disp: 90 tablet, Rfl: 3  EXAM:  VITALS per patient if applicable:  GENERAL: alert, oriented, appears well and in no acute distress  HEENT: atraumatic, conjunttiva clear, no obvious abnormalities on inspection of external nose and ears  NECK: normal movements of the head and neck  LUNGS: on inspection no signs of respiratory distress, breathing rate appears normal, no obvious gross SOB, gasping or wheezing  CV: no obvious cyanosis  MS: moves all visible extremities without noticeable abnormality  PSYCH/NEURO: pleasant and cooperative, no obvious depression or anxiety, speech and thought processing  grossly intact  ASSESSMENT AND PLAN:  Discussed the following assessment and plan:   COVID-19 infection.  Patient does have history of underlying asthma which is well controlled.  Symptoms are relatively mild at this time.  No fever.  No dyspnea.  -We recommended observation at this point and plenty of fluids and rest.  Tylenol or Motrin as needed for sore throat symptoms -We did discuss antiviral therapies but at this point her symptoms are mild and seem to be very stable and we recommended observation instead.  Follow-up promptly for any dyspnea or other concerns    I discussed the assessment and treatment plan with the patient. The patient was provided an opportunity to ask questions and all were answered. The patient agreed with the plan and demonstrated an understanding of the instructions.   The patient was advised to call back or seek an in-person evaluation if the symptoms worsen or if the condition fails to improve as anticipated.     Carolann Littler, MD

## 2020-12-16 ENCOUNTER — Ambulatory Visit (INDEPENDENT_AMBULATORY_CARE_PROVIDER_SITE_OTHER): Payer: 59 | Admitting: *Deleted

## 2020-12-16 DIAGNOSIS — J309 Allergic rhinitis, unspecified: Secondary | ICD-10-CM | POA: Diagnosis not present

## 2020-12-24 ENCOUNTER — Ambulatory Visit (INDEPENDENT_AMBULATORY_CARE_PROVIDER_SITE_OTHER): Payer: 59

## 2020-12-24 DIAGNOSIS — J309 Allergic rhinitis, unspecified: Secondary | ICD-10-CM

## 2020-12-26 ENCOUNTER — Other Ambulatory Visit: Payer: Self-pay

## 2020-12-26 ENCOUNTER — Ambulatory Visit
Admission: RE | Admit: 2020-12-26 | Discharge: 2020-12-26 | Disposition: A | Discharge status: Home / Self Care | Payer: 59 | Source: Ambulatory Visit | Attending: Obstetrics and Gynecology | Admitting: Obstetrics and Gynecology

## 2020-12-26 DIAGNOSIS — Z1231 Encounter for screening mammogram for malignant neoplasm of breast: Secondary | ICD-10-CM

## 2020-12-31 ENCOUNTER — Ambulatory Visit (INDEPENDENT_AMBULATORY_CARE_PROVIDER_SITE_OTHER): Payer: 59

## 2020-12-31 DIAGNOSIS — J309 Allergic rhinitis, unspecified: Secondary | ICD-10-CM

## 2021-01-07 ENCOUNTER — Ambulatory Visit (INDEPENDENT_AMBULATORY_CARE_PROVIDER_SITE_OTHER): Payer: 59 | Admitting: *Deleted

## 2021-01-07 DIAGNOSIS — J309 Allergic rhinitis, unspecified: Secondary | ICD-10-CM | POA: Diagnosis not present

## 2021-01-14 ENCOUNTER — Ambulatory Visit (INDEPENDENT_AMBULATORY_CARE_PROVIDER_SITE_OTHER): Payer: 59 | Admitting: *Deleted

## 2021-01-14 DIAGNOSIS — J309 Allergic rhinitis, unspecified: Secondary | ICD-10-CM

## 2021-01-21 ENCOUNTER — Ambulatory Visit (INDEPENDENT_AMBULATORY_CARE_PROVIDER_SITE_OTHER): Payer: 59

## 2021-01-21 DIAGNOSIS — J309 Allergic rhinitis, unspecified: Secondary | ICD-10-CM | POA: Diagnosis not present

## 2021-01-28 ENCOUNTER — Ambulatory Visit (INDEPENDENT_AMBULATORY_CARE_PROVIDER_SITE_OTHER): Payer: 59

## 2021-01-28 DIAGNOSIS — J309 Allergic rhinitis, unspecified: Secondary | ICD-10-CM

## 2021-02-06 ENCOUNTER — Ambulatory Visit (INDEPENDENT_AMBULATORY_CARE_PROVIDER_SITE_OTHER): Payer: 59 | Admitting: *Deleted

## 2021-02-06 DIAGNOSIS — J309 Allergic rhinitis, unspecified: Secondary | ICD-10-CM | POA: Diagnosis not present

## 2021-02-11 ENCOUNTER — Ambulatory Visit (INDEPENDENT_AMBULATORY_CARE_PROVIDER_SITE_OTHER): Payer: 59

## 2021-02-11 DIAGNOSIS — J309 Allergic rhinitis, unspecified: Secondary | ICD-10-CM | POA: Diagnosis not present

## 2021-02-12 ENCOUNTER — Other Ambulatory Visit: Payer: Self-pay | Admitting: Allergy and Immunology

## 2021-02-17 ENCOUNTER — Other Ambulatory Visit: Payer: Self-pay

## 2021-02-17 ENCOUNTER — Other Ambulatory Visit: Payer: Self-pay | Admitting: Allergy and Immunology

## 2021-02-17 ENCOUNTER — Encounter: Payer: Self-pay | Admitting: Allergy and Immunology

## 2021-02-17 ENCOUNTER — Ambulatory Visit: Payer: 59 | Admitting: Allergy and Immunology

## 2021-02-17 VITALS — BP 124/78 | HR 85 | Temp 98.7°F | Resp 16 | Ht 64.0 in | Wt 136.2 lb

## 2021-02-17 DIAGNOSIS — J4550 Severe persistent asthma, uncomplicated: Secondary | ICD-10-CM | POA: Diagnosis not present

## 2021-02-17 DIAGNOSIS — I4949 Other premature depolarization: Secondary | ICD-10-CM

## 2021-02-17 MED ORDER — BREZTRI AEROSPHERE 160-9-4.8 MCG/ACT IN AERO: 2.0000 | INHALATION_SPRAY | Freq: Two times a day (BID) | RESPIRATORY_TRACT | 5 refills | Status: DC

## 2021-02-17 NOTE — Patient Instructions (Addendum)
  1.  Continue Breztri - 2 inhalations 1-2 times per day with spacer  2.  Continue Albuterol HFA if needed  3.  Continue immunotherapy  4.  Return to clinic in 6 months or earlier if needed  5.  Obtain fall flu vaccine  6.  Keep check on heart rate and rhythm

## 2021-02-17 NOTE — Progress Notes (Signed)
[facility] [place] [facility] [place]   Follow-up Note  Referring Provider: Billie Ruddy, MD Primary Provider: Billie Ruddy, MD Date of Office Visit: 02/17/2021  Subjective:   Angela [name] (DOB: [birth year]) is a 65 y.o. female who returns to the [facility] [place] on 02/17/2021 in re-evaluation of the following:  HPI: Angela [name] returns to this clinic in reevaluation of asthma.  I last saw her in this clinic 12 August 2020.  She started a course of immunotherapy in March 2022 and she is currently using this form of treatment every week without any adverse effect.  She has really done well with her breathing and she is able to exercise without any difficulty while consolidating her Angela [name] use to just 1 time per day.  Rarely does she use a short acting bronchodilator.  She does notice that when she uses the triple inhaler in the morning she might get a little bit of slight tremor.  She has a history of mitral valve prolapse with mitral regurgitation.  She has received 4 COVID vaccines and contracted COVID in June 2022 manifested as a very mild upper respiratory tract infection.  Allergies as of 02/17/2021       Reactions   Codeine Nausea And Vomiting   Other Other (See Comments)   BP RISE        Medication List    albuterol 108 (90 Base) MCG/ACT inhaler Commonly known as: Ventolin HFA 2 puffs every 4 hours if needed   Breztri Aerosphere 160-9-4.8 MCG/ACT Aero Generic drug: Budeson-Glycopyrrol-Formoterol INHALE 2 PUFFS INTO THE LUNGS IN THE MORNING AND AT BEDTIME   estradiol 1.53 MG/SPRAY transdermal spray Commonly known as: EVAMIST Place 2 sprays onto the skin daily.   potassium chloride SA 20 MEQ tablet Commonly known as: KLOR-CON TAKE 1 TABLET(20 MEQ) BY MOUTH DAILY   progesterone 100 MG capsule Commonly known as: PROMETRIUM Take 100 mg by mouth at bedtime.   triamterene-hydrochlorothiazide 37.5-25 MG tablet Commonly known as:  MAXZIDE-25 TAKE 1 TABLET BY MOUTH DAILY   Vitamin D2 10 MCG (400 UNIT) Tabs Take 1 tablet by mouth daily.    Past Medical History:  Diagnosis Date   Asthma    Diverticulosis    Elevated C-reactive protein (CRP)    Esophageal stricture    GERD (gastroesophageal reflux disease)    HH (hiatus hernia)    Hypertension    MVP (mitral valve prolapse)    Osteoporosis    Renal calculus     Past Surgical History:  Procedure Laterality Date   Broken Elbow Right    Broken Finger Right    Ring Finger    Review of systems negative except as noted in HPI / PMHx or noted below:  Review of Systems  Constitutional: Negative.   HENT: Negative.    Eyes: Negative.   Respiratory: Negative.    Cardiovascular: Negative.   Gastrointestinal: Negative.   Genitourinary: Negative.   Musculoskeletal: Negative.   Skin: Negative.   Neurological: Negative.   Endo/Heme/Allergies: Negative.   Psychiatric/Behavioral: Negative.      Objective:   Vitals:   02/17/21 1453  BP: 124/78  Pulse: 85  Resp: 16  Temp: 98.7 F (37.1 C)  SpO2: 98%   Height: '5\' 4"'$  (162.6 cm)  Weight: 136 lb 4 oz (61.8 kg)   Physical Exam Constitutional:      Appearance: She is not diaphoretic.  HENT:     Head: Normocephalic.  Right Ear: Tympanic membrane, ear canal and external ear normal.     Left Ear: Tympanic membrane, ear canal and external ear normal.     Nose: Nose normal. No mucosal edema or rhinorrhea.     Mouth/Throat:     Pharynx: Uvula midline. No oropharyngeal exudate.  Eyes:     Conjunctiva/sclera: Conjunctivae normal.  Neck:     Thyroid: No thyromegaly.     Trachea: Trachea normal. No tracheal tenderness or tracheal deviation.  Cardiovascular:     Rate and Rhythm: Normal rate. Rhythm irregular.     Heart sounds: S1 normal and S2 normal. Murmur (Midsystolic) heard.     Comments: Intermittent premature contraction approximately 3 with 1 minute time period. Pulmonary:     Effort: No  respiratory distress.     Breath sounds: Normal breath sounds. No stridor. No wheezing or rales.  Lymphadenopathy:     Head:     Right side of head: No tonsillar adenopathy.     Left side of head: No tonsillar adenopathy.     Cervical: No cervical adenopathy.  Skin:    Findings: No erythema or rash.     Nails: There is no clubbing.  Neurological:     Mental Status: She is alert.    Diagnostics:    Spirometry was performed and demonstrated an FEV1 of 1.74 at 70 % of predicted.  Review of an EKG performed 13 April 2017 identifies normal sinus rhythm without any other abnormality  Assessment and Plan:   1. Asthma, severe persistent, well-controlled   2. Premature contractions, cardiac     1.  Continue Breztri - 2 inhalations 1-2 times per day with spacer  2.  Continue Albuterol HFA if needed  3.  Continue immunotherapy  4.  Return to clinic in 6 months or earlier if needed  5.  Obtain fall flu vaccine  6.  Keep check on heart rate and rhythm  Angela [name] appears to be doing very well on her current therapy and we will continue to have her use a triple inhaler and immunotherapy to address her respiratory tract inflammatory condition.  She does appear to have some premature contractions of her heart and I just asked her to be cognizant of this issue and check her heart rate and rhythm and if she becomes symptomatic regarding this issue then she is definitely going to require further evaluation.  She does not really consume much caffeine or take decongestants and it may just be the long-acting bronchodilator component of respiratory that is responsible for this intermittent issue.  Allena Katz, MD Allergy / Immunology [place]

## 2021-02-18 ENCOUNTER — Encounter: Payer: Self-pay | Admitting: Allergy and Immunology

## 2021-02-18 ENCOUNTER — Ambulatory Visit (INDEPENDENT_AMBULATORY_CARE_PROVIDER_SITE_OTHER): Payer: 59

## 2021-02-18 DIAGNOSIS — J309 Allergic rhinitis, unspecified: Secondary | ICD-10-CM | POA: Diagnosis not present

## 2021-02-23 DIAGNOSIS — J3089 Other allergic rhinitis: Secondary | ICD-10-CM | POA: Diagnosis not present

## 2021-02-23 NOTE — Progress Notes (Signed)
VIALS MADE. EXP 02-23-22

## 2021-02-24 ENCOUNTER — Ambulatory Visit (INDEPENDENT_AMBULATORY_CARE_PROVIDER_SITE_OTHER): Payer: 59 | Admitting: *Deleted

## 2021-02-24 DIAGNOSIS — J309 Allergic rhinitis, unspecified: Secondary | ICD-10-CM | POA: Diagnosis not present

## 2021-03-04 ENCOUNTER — Ambulatory Visit (INDEPENDENT_AMBULATORY_CARE_PROVIDER_SITE_OTHER): Payer: 59

## 2021-03-04 DIAGNOSIS — J309 Allergic rhinitis, unspecified: Secondary | ICD-10-CM | POA: Diagnosis not present

## 2021-03-05 ENCOUNTER — Other Ambulatory Visit: Payer: Self-pay | Admitting: Family Medicine

## 2021-03-05 ENCOUNTER — Other Ambulatory Visit: Payer: Self-pay | Admitting: Obstetrics and Gynecology

## 2021-03-05 DIAGNOSIS — M810 Age-related osteoporosis without current pathological fracture: Secondary | ICD-10-CM

## 2021-03-09 ENCOUNTER — Ambulatory Visit (INDEPENDENT_AMBULATORY_CARE_PROVIDER_SITE_OTHER): Payer: 59

## 2021-03-09 DIAGNOSIS — J309 Allergic rhinitis, unspecified: Secondary | ICD-10-CM

## 2021-03-10 ENCOUNTER — Ambulatory Visit: Payer: 59 | Admitting: Internal Medicine

## 2021-03-10 DIAGNOSIS — J3081 Allergic rhinitis due to animal (cat) (dog) hair and dander: Secondary | ICD-10-CM

## 2021-03-20 ENCOUNTER — Ambulatory Visit (INDEPENDENT_AMBULATORY_CARE_PROVIDER_SITE_OTHER): Payer: 59

## 2021-03-20 DIAGNOSIS — J309 Allergic rhinitis, unspecified: Secondary | ICD-10-CM | POA: Diagnosis not present

## 2021-03-27 ENCOUNTER — Ambulatory Visit (INDEPENDENT_AMBULATORY_CARE_PROVIDER_SITE_OTHER): Payer: 59 | Admitting: *Deleted

## 2021-03-27 DIAGNOSIS — J309 Allergic rhinitis, unspecified: Secondary | ICD-10-CM

## 2021-03-30 ENCOUNTER — Other Ambulatory Visit: Payer: Self-pay

## 2021-03-30 ENCOUNTER — Ambulatory Visit
Admission: RE | Admit: 2021-03-30 | Discharge: 2021-03-30 | Disposition: A | Discharge status: Home / Self Care | Payer: 59 | Source: Ambulatory Visit | Attending: Obstetrics and Gynecology | Admitting: Obstetrics and Gynecology

## 2021-03-30 ENCOUNTER — Ambulatory Visit (INDEPENDENT_AMBULATORY_CARE_PROVIDER_SITE_OTHER): Payer: 59

## 2021-03-30 DIAGNOSIS — M810 Age-related osteoporosis without current pathological fracture: Secondary | ICD-10-CM

## 2021-03-30 DIAGNOSIS — J309 Allergic rhinitis, unspecified: Secondary | ICD-10-CM

## 2021-04-16 ENCOUNTER — Ambulatory Visit (INDEPENDENT_AMBULATORY_CARE_PROVIDER_SITE_OTHER): Payer: 59 | Admitting: *Deleted

## 2021-04-16 DIAGNOSIS — J309 Allergic rhinitis, unspecified: Secondary | ICD-10-CM

## 2021-04-21 ENCOUNTER — Ambulatory Visit (INDEPENDENT_AMBULATORY_CARE_PROVIDER_SITE_OTHER): Payer: 59 | Admitting: *Deleted

## 2021-04-21 DIAGNOSIS — J309 Allergic rhinitis, unspecified: Secondary | ICD-10-CM

## 2021-04-23 ENCOUNTER — Ambulatory Visit (INDEPENDENT_AMBULATORY_CARE_PROVIDER_SITE_OTHER): Payer: 59

## 2021-04-23 ENCOUNTER — Other Ambulatory Visit: Payer: Self-pay

## 2021-04-23 DIAGNOSIS — Z23 Encounter for immunization: Secondary | ICD-10-CM

## 2021-04-27 ENCOUNTER — Ambulatory Visit (INDEPENDENT_AMBULATORY_CARE_PROVIDER_SITE_OTHER): Payer: 59

## 2021-04-27 DIAGNOSIS — J309 Allergic rhinitis, unspecified: Secondary | ICD-10-CM | POA: Diagnosis not present

## 2021-04-30 ENCOUNTER — Ambulatory Visit (INDEPENDENT_AMBULATORY_CARE_PROVIDER_SITE_OTHER): Payer: 59

## 2021-04-30 ENCOUNTER — Other Ambulatory Visit: Payer: Self-pay

## 2021-04-30 ENCOUNTER — Ambulatory Visit: Payer: Self-pay | Admitting: *Deleted

## 2021-04-30 DIAGNOSIS — Z23 Encounter for immunization: Secondary | ICD-10-CM

## 2021-04-30 NOTE — Progress Notes (Signed)
   Covid-19 Vaccination Clinic  Name:  Angela [name] [facility]    MRN: [id number] DOB: [birth year]  04/30/2021  Angela [name] was observed post Covid-19 immunization for 15 minutes without incident. She was provided with Vaccine Information Sheet and instruction to access the V-Safe system.   Angela [name] was instructed to call 911 with any severe reactions post vaccine: Difficulty breathing  Swelling of face and throat  A fast heartbeat  A bad rash all over body  Dizziness and weakness

## 2021-05-06 ENCOUNTER — Ambulatory Visit (INDEPENDENT_AMBULATORY_CARE_PROVIDER_SITE_OTHER): Payer: 59

## 2021-05-06 DIAGNOSIS — J309 Allergic rhinitis, unspecified: Secondary | ICD-10-CM

## 2021-05-14 ENCOUNTER — Ambulatory Visit (INDEPENDENT_AMBULATORY_CARE_PROVIDER_SITE_OTHER): Payer: 59 | Admitting: *Deleted

## 2021-05-14 DIAGNOSIS — J309 Allergic rhinitis, unspecified: Secondary | ICD-10-CM

## 2021-05-22 ENCOUNTER — Ambulatory Visit (INDEPENDENT_AMBULATORY_CARE_PROVIDER_SITE_OTHER): Payer: BC Managed Care – PPO

## 2021-05-22 DIAGNOSIS — J309 Allergic rhinitis, unspecified: Secondary | ICD-10-CM

## 2021-06-02 ENCOUNTER — Ambulatory Visit (INDEPENDENT_AMBULATORY_CARE_PROVIDER_SITE_OTHER): Payer: BC Managed Care – PPO | Admitting: *Deleted

## 2021-06-02 DIAGNOSIS — J309 Allergic rhinitis, unspecified: Secondary | ICD-10-CM | POA: Diagnosis not present

## 2021-06-09 NOTE — Progress Notes (Signed)
VIALS MADE. EXP 06-09-22 °

## 2021-06-10 ENCOUNTER — Ambulatory Visit (INDEPENDENT_AMBULATORY_CARE_PROVIDER_SITE_OTHER): Payer: BC Managed Care – PPO

## 2021-06-10 DIAGNOSIS — J309 Allergic rhinitis, unspecified: Secondary | ICD-10-CM

## 2021-06-11 DIAGNOSIS — J3081 Allergic rhinitis due to animal (cat) (dog) hair and dander: Secondary | ICD-10-CM | POA: Diagnosis not present

## 2021-06-17 ENCOUNTER — Ambulatory Visit (INDEPENDENT_AMBULATORY_CARE_PROVIDER_SITE_OTHER): Payer: BC Managed Care – PPO

## 2021-06-17 DIAGNOSIS — J309 Allergic rhinitis, unspecified: Secondary | ICD-10-CM

## 2021-06-23 ENCOUNTER — Ambulatory Visit (INDEPENDENT_AMBULATORY_CARE_PROVIDER_SITE_OTHER): Payer: BC Managed Care – PPO | Admitting: *Deleted

## 2021-06-23 DIAGNOSIS — J309 Allergic rhinitis, unspecified: Secondary | ICD-10-CM

## 2021-07-01 ENCOUNTER — Ambulatory Visit (INDEPENDENT_AMBULATORY_CARE_PROVIDER_SITE_OTHER): Payer: BC Managed Care – PPO

## 2021-07-01 DIAGNOSIS — J309 Allergic rhinitis, unspecified: Secondary | ICD-10-CM | POA: Diagnosis not present

## 2021-07-07 ENCOUNTER — Ambulatory Visit (INDEPENDENT_AMBULATORY_CARE_PROVIDER_SITE_OTHER): Payer: BC Managed Care – PPO

## 2021-07-07 DIAGNOSIS — J309 Allergic rhinitis, unspecified: Secondary | ICD-10-CM

## 2021-07-15 ENCOUNTER — Other Ambulatory Visit: Payer: Self-pay | Admitting: Family Medicine

## 2021-07-15 ENCOUNTER — Ambulatory Visit (INDEPENDENT_AMBULATORY_CARE_PROVIDER_SITE_OTHER): Payer: BC Managed Care – PPO | Admitting: *Deleted

## 2021-07-15 DIAGNOSIS — I10 Essential (primary) hypertension: Secondary | ICD-10-CM

## 2021-07-15 DIAGNOSIS — J309 Allergic rhinitis, unspecified: Secondary | ICD-10-CM | POA: Diagnosis not present

## 2021-07-16 ENCOUNTER — Ambulatory Visit: Payer: BC Managed Care – PPO | Admitting: Family Medicine

## 2021-07-16 ENCOUNTER — Encounter: Payer: Self-pay | Admitting: Family Medicine

## 2021-07-16 VITALS — BP 140/80 | HR 93 | Temp 98.6°F | Wt 136.0 lb

## 2021-07-16 DIAGNOSIS — E876 Hypokalemia: Secondary | ICD-10-CM

## 2021-07-16 DIAGNOSIS — I10 Essential (primary) hypertension: Secondary | ICD-10-CM

## 2021-07-16 MED ORDER — TRIAMTERENE-HCTZ 37.5-25 MG PO TABS: 1.0000 | ORAL_TABLET | Freq: Every day | ORAL | 3 refills | Status: DC

## 2021-07-16 MED ORDER — POTASSIUM CHLORIDE CRYS ER 20 MEQ PO TBCR: EXTENDED_RELEASE_TABLET | ORAL | 3 refills | Status: DC

## 2021-07-16 NOTE — Progress Notes (Signed)
Subjective:    Patient ID: Angela [name], female    DOB: [birth year], 66 y.o.   MRN: [id number]  Chief Complaint  Patient presents with   discuss medications    HPI Patient was seen today for f/u on HTN.  Pt states she has been doing well.  Staying busy at work.  BP at home 120s/70-80s on triamterene-hydrochlorothiazide 37.5-25 mg daily.  Taking Klor con 20 mEq.  Receiving allergy shots which has seemed to help.  Pt has not had any HAs since starting the shots.  Seen by OB/Gyn in Aug/Sept 2022, had labs done which were stable.  Past Medical History:  Diagnosis Date   Asthma    Diverticulosis    Elevated C-reactive protein (CRP)    Esophageal stricture    GERD (gastroesophageal reflux disease)    HH (hiatus hernia)    Hypertension    MVP (mitral valve prolapse)    Osteoporosis    Renal calculus     Allergies  Allergen Reactions   Codeine Nausea And Vomiting   Other Other (See Comments)    BP RISE    ROS General: Denies fever, chills, night sweats, changes in weight, changes in appetite + whitecoat hypertension HEENT: Denies headaches, ear pain, changes in vision, rhinorrhea, sore throat CV: Denies CP, palpitations, SOB, orthopnea Pulm: Denies SOB, cough, wheezing GI: Denies abdominal pain, nausea, vomiting, diarrhea, constipation GU: Denies dysuria, hematuria, frequency, vaginal discharge Msk: Denies muscle cramps, joint pains Neuro: Denies weakness, numbness, tingling Skin: Denies rashes, bruising Psych: Denies depression, anxiety, hallucinations     Objective:    Blood pressure 140/80, pulse 93, temperature 98.6 F (37 C), temperature source Oral, weight 136 lb (61.7 kg), SpO2 98 %.  Gen. Pleasant, well-nourished, in no distress, normal affect   HEENT: [place]/AT, face symmetric, conjunctiva clear, no scleral icterus, PERRLA, EOMI, nares patent without drainage Lungs: no accessory muscle use, CTAB, no wheezes or rales Cardiovascular: RRR, no m/r/g, no peripheral  edema Musculoskeletal: No deformities, no cyanosis or clubbing, normal tone Neuro:  A&Ox3, CN II-XII intact, normal gait Skin:  Warm, no lesions/ rash   Wt Readings from Last 3 Encounters:  07/16/21 136 lb (61.7 kg)  02/17/21 136 lb 4 oz (61.8 kg)  06/24/20 160 lb (72.6 kg)    Lab Results  Component Value Date   WBC 8.5 05/06/2020   HGB 14.5 05/06/2020   HCT 44.2 05/06/2020   PLT 300.0 03/11/2020   GLUCOSE 98 01/30/2018   CHOL 234 (H) 10/15/2010   TRIG 47.0 10/15/2010   HDL 89.30 10/15/2010   LDLDIRECT 133.1 10/15/2010   LDLCALC 117 (H) 10/21/2008   ALT 18 10/15/2010   AST 18 10/15/2010   NA 139 01/30/2018   K 3.9 02/24/2018   CL 104 01/30/2018   CREATININE 0.87 01/30/2018   BUN 16 01/30/2018   CO2 28 01/30/2018    Assessment/Plan:  Essential hypertension  -Elevated in clinic but well controlled at home.  Whitecoat hypertension contributing.   -Continue current medication triamterene-hydrochlorothiazide 37.5-25 mg daily and Klor-Con 20 mEq daily -Continue lifestyle modifications - Plan: triamterene-hydrochlorothiazide (MAXZIDE-25) 37.5-25 MG tablet  Hypokalemia  - Plan: potassium chloride SA (KLOR-CON M) 20 MEQ tablet  F/u as needed  Grier Mitts, MD

## 2021-07-21 ENCOUNTER — Ambulatory Visit (INDEPENDENT_AMBULATORY_CARE_PROVIDER_SITE_OTHER): Payer: BC Managed Care – PPO

## 2021-07-21 DIAGNOSIS — J309 Allergic rhinitis, unspecified: Secondary | ICD-10-CM

## 2021-07-28 ENCOUNTER — Ambulatory Visit (INDEPENDENT_AMBULATORY_CARE_PROVIDER_SITE_OTHER): Payer: BC Managed Care – PPO | Admitting: *Deleted

## 2021-07-28 DIAGNOSIS — J309 Allergic rhinitis, unspecified: Secondary | ICD-10-CM | POA: Diagnosis not present

## 2021-08-06 ENCOUNTER — Ambulatory Visit (INDEPENDENT_AMBULATORY_CARE_PROVIDER_SITE_OTHER): Payer: BC Managed Care – PPO | Admitting: *Deleted

## 2021-08-06 DIAGNOSIS — J309 Allergic rhinitis, unspecified: Secondary | ICD-10-CM

## 2021-08-10 ENCOUNTER — Other Ambulatory Visit: Payer: Self-pay

## 2021-08-10 ENCOUNTER — Telehealth (INDEPENDENT_AMBULATORY_CARE_PROVIDER_SITE_OTHER): Payer: BC Managed Care – PPO | Admitting: Family Medicine

## 2021-08-10 ENCOUNTER — Other Ambulatory Visit: Payer: Self-pay | Admitting: Family Medicine

## 2021-08-10 ENCOUNTER — Encounter: Payer: Self-pay | Admitting: Family Medicine

## 2021-08-10 DIAGNOSIS — J0140 Acute pansinusitis, unspecified: Secondary | ICD-10-CM

## 2021-08-10 DIAGNOSIS — J040 Acute laryngitis: Secondary | ICD-10-CM

## 2021-08-10 MED ORDER — FLUTICASONE PROPIONATE 50 MCG/ACT NA SUSP: 1.0000 | Freq: Every day | NASAL | 1 refills | Status: DC

## 2021-08-10 MED ORDER — PREDNISONE 20 MG PO TABS
40.0000 mg | ORAL_TABLET | Freq: Every day | ORAL | 0 refills | Status: AC
Start: 2021-08-10 — End: 2021-08-13

## 2021-08-10 MED ORDER — AMOXICILLIN-POT CLAVULANATE 500-125 MG PO TABS: 1.0000 | ORAL_TABLET | Freq: Two times a day (BID) | ORAL | 0 refills | Status: AC

## 2021-08-10 NOTE — Progress Notes (Signed)
Virtual Visit via Video Note  I connected with on 08/10/21 at  3:30 PM EST by a video enabled telemedicine application 2/2 [id number] pandemic and verified that I am speaking with the correct person using two identifiers.  Location patient: home Location provider:work or home office Persons participating in the virtual visit: patient, provider  I discussed the limitations of evaluation and management by telemedicine and the availability of in person appointments. The patient expressed understanding and agreed to proceed. Chief Complaint  Patient presents with   Laryngitis    Sinus cong a week ago, sore throat for 4 days and lost voice. It has improved but only about 40 %. Just doing honey and tea, voice rest rest. Was concerned decongestants were contributing, so stopped taking those.     HPI: Pt is a 66 year old female with pmh sig for asthma, diverticulosis, GERD, HTN, osteoporosis, MVP, HLD was seen for acute concern.  Pt with sinus congestion that started 1.5 weeks ago. Pt then developed sore throat x 4 days with some improvement but congestion continued and voice became hoarse.  Now with mild pressure in face at R maxillary and R side of frontal sinuses.  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  Tried OTC decongestants, honey, and tea.  Denies nasal drainage or cough.  ROS: See pertinent positives and negatives per HPI.  Past Medical History:  Diagnosis Date   Asthma    Diverticulosis    Elevated C-reactive protein (CRP)    Esophageal stricture    GERD (gastroesophageal reflux disease)    HH (hiatus hernia)    Hypertension    MVP (mitral valve prolapse)    Osteoporosis    Renal calculus     Past Surgical History:  Procedure Laterality Date   Broken Elbow Right    Broken Finger Right    Ring Finger    Family History  Problem Relation Age of Onset   Breast cancer Maternal Aunt        x 2   Heart disease Maternal Uncle         multiple   Heart attack Father    Anuerysm Mother        aortal   Colon cancer Neg Hx    Esophageal cancer Neg Hx    Liver cancer Neg Hx    Pancreatic cancer Neg Hx    Rectal cancer Neg Hx    Stomach cancer Neg Hx      Current Outpatient Medications:    albuterol (VENTOLIN HFA) 108 (90 Base) MCG/ACT inhaler, 2 puffs every 4 hours if needed, Disp: 18 g, Rfl: 5   Budeson-Glycopyrrol-Formoterol (BREZTRI AEROSPHERE) 160-9-4.8 MCG/ACT AERO, Inhale 2 puffs into the lungs in the morning and at bedtime., Disp: 10.7 g, Rfl: 5   Ergocalciferol (VITAMIN D2) 400 units TABS, Take 1 tablet by mouth daily., Disp: , Rfl:    estradiol (EVAMIST) 1.53 MG/SPRAY transdermal spray, Place 2 sprays onto the skin daily., Disp: , Rfl:    potassium chloride SA (KLOR-CON M) 20 MEQ tablet, TAKE 1 TABLET(20 MEQ) BY MOUTH DAILY, Disp: 90 tablet, Rfl: 3   progesterone (PROMETRIUM) 100 MG capsule, Take 100 mg by mouth at bedtime., Disp: , Rfl:    triamterene-hydrochlorothiazide (MAXZIDE-25) 37.5-25 MG tablet, Take 1 tablet by mouth daily., Disp: 90 tablet, Rfl: 3  EXAM:  VITALS per patient if applicable: RR between 32-20 bpm  GENERAL: alert, oriented, appears well and in no acute  distress  HEENT: atraumatic, conjunctiva clear, no obvious abnormalities on inspection of external nose and ears.  Mild hoarseness to voice.  NECK: normal movements of the head and neck  LUNGS: on inspection no signs of respiratory distress, breathing rate appears normal, no obvious gross SOB, gasping or wheezing  CV: no obvious cyanosis  MS: moves all visible extremities without noticeable abnormality  PSYCH/NEURO: pleasant and cooperative, no obvious depression or anxiety, speech and thought processing grossly intact  ASSESSMENT AND PLAN:  Discussed the following assessment and plan:  Acute pansinusitis, recurrence not specified  -Acute sinusitis likely due to initial improvement in nasal congestion with return of symptoms x  over a week and a half. -will start abx and flonase -Discussed other supportive care including steam from shower, Tylenol or NSAIDs as needed for any pain/discomfort - Plan: fluticasone (FLONASE) 50 MCG/ACT nasal spray, amoxicillin-clavulanate (AUGMENTIN) 500-125 MG tablet  Laryngitis  -cause likely viral versus 2/2 postnasal drainage -Gargling with warm salt water Chloraseptic spray, OTC cough drops. -Continue warm fluids, honey, and vocal rest -Start prednisone burst to decrease inflammation - Plan: predniSONE (DELTASONE) 20 MG tablet  Follow-up as needed  I discussed the assessment and treatment plan with the patient. The patient was provided an opportunity to ask questions and all were answered. The patient agreed with the plan and demonstrated an understanding of the instructions.   The patient was advised to call back or seek an in-person evaluation if the symptoms worsen or if the condition fails to improve as anticipated.   Billie Ruddy, MD

## 2021-08-12 ENCOUNTER — Ambulatory Visit (INDEPENDENT_AMBULATORY_CARE_PROVIDER_SITE_OTHER): Payer: BC Managed Care – PPO

## 2021-08-12 DIAGNOSIS — J309 Allergic rhinitis, unspecified: Secondary | ICD-10-CM

## 2021-08-18 ENCOUNTER — Ambulatory Visit (INDEPENDENT_AMBULATORY_CARE_PROVIDER_SITE_OTHER): Payer: BC Managed Care – PPO

## 2021-08-18 DIAGNOSIS — J309 Allergic rhinitis, unspecified: Secondary | ICD-10-CM | POA: Diagnosis not present

## 2021-08-27 ENCOUNTER — Ambulatory Visit (INDEPENDENT_AMBULATORY_CARE_PROVIDER_SITE_OTHER): Payer: BC Managed Care – PPO

## 2021-08-27 DIAGNOSIS — J309 Allergic rhinitis, unspecified: Secondary | ICD-10-CM | POA: Diagnosis not present

## 2021-08-31 ENCOUNTER — Ambulatory Visit (INDEPENDENT_AMBULATORY_CARE_PROVIDER_SITE_OTHER): Payer: BC Managed Care – PPO

## 2021-08-31 DIAGNOSIS — J309 Allergic rhinitis, unspecified: Secondary | ICD-10-CM | POA: Diagnosis not present

## 2021-09-01 ENCOUNTER — Ambulatory Visit: Payer: BC Managed Care – PPO | Admitting: Allergy and Immunology

## 2021-09-01 ENCOUNTER — Encounter: Payer: Self-pay | Admitting: Allergy and Immunology

## 2021-09-01 ENCOUNTER — Other Ambulatory Visit: Payer: Self-pay

## 2021-09-01 VITALS — BP 128/76 | HR 89 | Temp 97.2°F | Resp 16 | Ht 63.0 in | Wt 138.2 lb

## 2021-09-01 DIAGNOSIS — J4550 Severe persistent asthma, uncomplicated: Secondary | ICD-10-CM | POA: Diagnosis not present

## 2021-09-01 NOTE — Patient Instructions (Addendum)
?  1.  Continue Breztri - 2 inhalations 1-2 times per day with spacer ? ?2.  Continue Albuterol HFA if needed ? ?3.  Continue immunotherapy ? ?4.  Return to clinic in 6 months or earlier if needed ? ? ?

## 2021-09-01 NOTE — Progress Notes (Signed)
? ?[place] ? ? ?Follow-up Note ? ?Referring Provider: Billie Ruddy, MD ?Primary Provider: Billie Ruddy, MD ?Date of Office Visit: 09/01/2021 ? ?Subjective:  ? ?Angela [name] (DOB: 1955-08-06) is a 66 y.o. female who returns to the [facility] [place] on 09/01/2021 in re-evaluation of the following: ? ?HPI: Angela [name] returns to this clinic in evaluation of asthma.  I last saw her in this clinic 17 February 2021. ? ?She has had an excellent interval of time regarding her airway.  Other than contracting one episode of sinusitis which sounds as though it was viral in etiology about 3 weeks ago manifested as acute onset of nasal congestion and sneezing and laryngitis treated apparently with a 3-day prednisone treatment as well as azithromycin with resolution within a 2-week timeframe she has really done well with her airway and has not required any systemic steroids or antibiotics.  Rarely does she use a short acting bronchodilator.  She exercises just about every day.  She has been using her Breztri just 1 time per day at this point. ? ?She continues on immunotherapy currently at every week administration. ? ?Allergies as of 09/01/2021   ? ?   Reactions  ? Codeine Nausea And Vomiting  ? Other Other (See Comments)  ? BP RISE  ? ?  ? ?  ?Medication List  ? ? ?albuterol 108 (90 Base) MCG/ACT inhaler ?Commonly known as: Ventolin HFA ?2 puffs every 4 hours if needed ?  ?Breztri Aerosphere 160-9-4.8 MCG/ACT Aero ?Generic drug: Budeson-Glycopyrrol-Formoterol ?Inhale 2 puffs into the lungs in the morning and at bedtime. ?  ?estradiol 1.53 MG/SPRAY transdermal spray ?Commonly known as: EVAMIST ?Place 2 sprays onto the skin daily. ?  ?fluticasone 50 MCG/ACT nasal spray ?Commonly known as: FLONASE ?SHAKE LIQUID AND USE 1 SPRAY IN EACH NOSTRIL DAILY ?  ?potassium chloride SA 20 MEQ tablet ?Commonly known as: KLOR-CON M ?TAKE 1 TABLET(20 MEQ) BY MOUTH DAILY ?   ?progesterone 100 MG capsule ?Commonly known as: PROMETRIUM ?Take 100 mg by mouth at bedtime. ?  ?triamterene-hydrochlorothiazide 37.5-25 MG tablet ?Commonly known as: MAXZIDE-25 ?Take 1 tablet by mouth daily. ?  ?Vitamin D2 10 MCG (400 UNIT) Tabs ?Take 1 tablet by mouth daily. ?  ? ?Past Medical History:  ?Diagnosis Date  ? Asthma   ? Diverticulosis   ? Elevated C-reactive protein (CRP)   ? Esophageal stricture   ? GERD (gastroesophageal reflux disease)   ? HH (hiatus hernia)   ? Hypertension   ? MVP (mitral valve prolapse)   ? Osteoporosis   ? Renal calculus   ? ? ?Past Surgical History:  ?Procedure Laterality Date  ? Broken Elbow Right   ? Broken Finger Right   ? Ring Finger  ? ? ?Review of systems negative except as noted in HPI / PMHx or noted below: ? ?Review of Systems  ?Constitutional: Negative.   ?HENT: Negative.    ?Eyes: Negative.   ?Respiratory: Negative.    ?Cardiovascular: Negative.   ?Gastrointestinal: Negative.   ?Genitourinary: Negative.   ?Musculoskeletal: Negative.   ?Skin: Negative.   ?Neurological: Negative.   ?Endo/Heme/Allergies: Negative.   ?Psychiatric/Behavioral: Negative.    ? ? ?Objective:  ? ?Vitals:  ? 09/01/21 1617  ?BP: 128/76  ?Pulse: 89  ?Resp: 16  ?Temp: (!) 97.2 ?F (36.2 ?C)  ?SpO2: 97%  ? ?Height: '5\' 3"'$  (160 cm)  ?Weight: 138 lb 3.2 oz (62.7 kg)  ? ?Physical Exam ?  Constitutional:   ?   Appearance: She is not diaphoretic.  ?HENT:  ?   Head: Normocephalic.  ?   Right Ear: Tympanic membrane, ear canal and external ear normal.  ?   Left Ear: Tympanic membrane, ear canal and external ear normal.  ?   Nose: Nose normal. No mucosal edema or rhinorrhea.  ?   Mouth/Throat:  ?   Pharynx: Uvula midline. No oropharyngeal exudate.  ?Eyes:  ?   Conjunctiva/sclera: Conjunctivae normal.  ?Neck:  ?   Thyroid: No thyromegaly.  ?   Trachea: Trachea normal. No tracheal tenderness or tracheal deviation.  ?Cardiovascular:  ?   Rate and Rhythm: Normal rate and regular rhythm.  ?   Heart sounds: Normal  heart sounds, S1 normal and S2 normal. No murmur heard. ?Pulmonary:  ?   Effort: No respiratory distress.  ?   Breath sounds: Normal breath sounds. No stridor. No wheezing or rales.  ?Lymphadenopathy:  ?   Head:  ?   Right side of head: No tonsillar adenopathy.  ?   Left side of head: No tonsillar adenopathy.  ?   Cervical: No cervical adenopathy.  ?Skin: ?   Findings: No erythema or rash.  ?   Nails: There is no clubbing.  ?Neurological:  ?   Mental Status: She is alert.  ? ? ?Diagnostics:  ?  ?Spirometry was performed and demonstrated an FEV1 of 1.56 at 68 % of predicted.  ? ?Assessment and Plan:  ? ?1. Asthma, severe persistent, well-controlled   ? ?1.  Continue Breztri - 2 inhalations 1-2 times per day with spacer ? ?2.  Continue Albuterol HFA if needed ? ?3.  Continue immunotherapy ? ?4.  Return to clinic in 6 months or earlier if needed ? ?Angela [name] is really doing very well at this point in time and she will continue on a combination of immunotherapy and a triple inhaler utilizing a dose of triple inhaler depending on her disease activity.  Assuming she continues to do well with this plan I will see her back in this clinic in 6 months or earlier if there is a problem. ? ?Allena Katz, MD ?Allergy / Immunology ?[place] ?

## 2021-09-02 ENCOUNTER — Encounter: Payer: Self-pay | Admitting: Allergy and Immunology

## 2021-09-07 DIAGNOSIS — J3081 Allergic rhinitis due to animal (cat) (dog) hair and dander: Secondary | ICD-10-CM | POA: Diagnosis not present

## 2021-09-07 NOTE — Progress Notes (Signed)
VIALS EXP 09-08-22 ?

## 2021-09-09 ENCOUNTER — Ambulatory Visit (INDEPENDENT_AMBULATORY_CARE_PROVIDER_SITE_OTHER): Payer: BC Managed Care – PPO

## 2021-09-09 DIAGNOSIS — J309 Allergic rhinitis, unspecified: Secondary | ICD-10-CM | POA: Diagnosis not present

## 2021-09-21 ENCOUNTER — Ambulatory Visit (INDEPENDENT_AMBULATORY_CARE_PROVIDER_SITE_OTHER): Payer: BC Managed Care – PPO

## 2021-09-21 DIAGNOSIS — J309 Allergic rhinitis, unspecified: Secondary | ICD-10-CM

## 2021-09-22 ENCOUNTER — Ambulatory Visit: Payer: BC Managed Care – PPO | Admitting: Allergy and Immunology

## 2021-09-22 ENCOUNTER — Encounter: Payer: Self-pay | Admitting: Allergy and Immunology

## 2021-09-22 VITALS — BP 116/72 | HR 88 | Temp 98.2°F | Resp 16 | Ht 63.0 in | Wt 133.4 lb

## 2021-09-22 DIAGNOSIS — K219 Gastro-esophageal reflux disease without esophagitis: Secondary | ICD-10-CM | POA: Diagnosis not present

## 2021-09-22 DIAGNOSIS — D7219 Other eosinophilia: Secondary | ICD-10-CM | POA: Diagnosis not present

## 2021-09-22 DIAGNOSIS — J4551 Severe persistent asthma with (acute) exacerbation: Secondary | ICD-10-CM

## 2021-09-22 NOTE — Patient Instructions (Addendum)
?  1.  Continue Breztri - 2 inhalations 1-2 times per day with spacer ? ?2.  Continue Albuterol HFA if needed ? ?3.  Continue immunotherapy ? ?4. For this episode: ? ? A. Prednisone 10 mg - 3 tablets now, 2 tablets this evening ? B. Prednisone 10 mg - 2 tabs daily x 5 days, then 1 tab daily x 5 days ? C. Continue Prilosec and Mucinex DM ? ?5.  Blood - CBC w/d.  Further treatment for exacerbations??? ? ?6.  Return to clinic in 6 months or earlier if needed ? ? ?

## 2021-09-22 NOTE — Progress Notes (Signed)
? ?[place] ? ? ?Follow-up Note ? ?Referring Provider: Billie Ruddy, MD ?Primary Provider: Billie Ruddy, MD ?Date of Office Visit: 09/22/2021 ? ?Subjective:  ? ?Angela [name] (DOB: December 14, 1955) is a 66 y.o. female who returns to the [facility] and [place] on 09/22/2021 in re-evaluation of the following: ? ?HPI: Angela [name] returns to this clinic in evaluation of asthma.  Her last visit to this clinic was 01 September 2021. ? ?Approximately 6 days ago Angela [name] had onset of acute cough and she has had very difficult time controlling this cough and it is disturbing her sleep.  She feels all congested in her chest.  She has no associated upper airway symptoms.  She did have some reflux episodes with her coughing and she started Prilosec which has resolved her reflux issue.  She has had some chills at nighttime and normal range temperature.  She took a COVID swab this morning which was negative.  Even though she had exposure to 2 days of rain with complete washout of all pollen and mold in the air she still continues to have the same problem.  This issue started after 4-day exposure [place] a beach house. ? ?Allergies as of 09/22/2021   ? ?   Reactions  ? Codeine Nausea And Vomiting  ? Other Other (See Comments)  ? BP RISE  ? ?  ? ?  ?Medication List  ? ? ?albuterol 108 (90 Base) MCG/ACT inhaler ?Commonly known as: Ventolin HFA ?2 puffs every 4 hours if needed ?  ?Breztri Aerosphere 160-9-4.8 MCG/ACT Aero ?Generic drug: Budeson-Glycopyrrol-Formoterol ?Inhale 2 puffs into the lungs in the morning and at bedtime. ?  ?estradiol 1.53 MG/SPRAY transdermal spray ?Commonly known as: EVAMIST ?Place 2 sprays onto the skin daily. ?  ?fluticasone 50 MCG/ACT nasal spray ?Commonly known as: FLONASE ?SHAKE LIQUID AND USE 1 SPRAY IN EACH NOSTRIL DAILY ?  ?potassium chloride SA 20 MEQ tablet ?Commonly known as: KLOR-CON M ?TAKE 1 TABLET(20 MEQ) BY MOUTH DAILY ?  ?progesterone 100 MG  capsule ?Commonly known as: PROMETRIUM ?Take 100 mg by mouth at bedtime. ?  ?triamterene-hydrochlorothiazide 37.5-25 MG tablet ?Commonly known as: MAXZIDE-25 ?Take 1 tablet by mouth daily. ?  ?Vitamin D2 10 MCG (400 UNIT) Tabs ?Take 1 tablet by mouth daily. ?  ? ?Past Medical History:  ?Diagnosis Date  ? Asthma   ? Diverticulosis   ? Elevated C-reactive protein (CRP)   ? Esophageal stricture   ? GERD (gastroesophageal reflux disease)   ? HH (hiatus hernia)   ? Hypertension   ? MVP (mitral valve prolapse)   ? Osteoporosis   ? Renal calculus   ? ? ?Past Surgical History:  ?Procedure Laterality Date  ? Broken Elbow Right   ? Broken Finger Right   ? Ring Finger  ? ? ?Review of systems negative except as noted in HPI / PMHx or noted below: ? ?Review of Systems  ?Constitutional: Negative.   ?HENT: Negative.    ?Eyes: Negative.   ?Respiratory: Negative.    ?Cardiovascular: Negative.   ?Gastrointestinal: Negative.   ?Genitourinary: Negative.   ?Musculoskeletal: Negative.   ?Skin: Negative.   ?Neurological: Negative.   ?Endo/Heme/Allergies: Negative.   ?Psychiatric/Behavioral: Negative.    ? ? ?Objective:  ? ?Vitals:  ? 09/22/21 1349  ?BP: 116/72  ?Pulse: 88  ?Resp: 16  ?Temp: 98.2 ?F (36.8 ?C)  ?SpO2: 98%  ? ?Height: '5\' 3"'$  (160 cm)  ?Weight: 133 lb 6.4  oz (60.5 kg)  ? ?Physical Exam ?Constitutional:   ?   Appearance: She is not diaphoretic.  ?HENT:  ?   Head: Normocephalic.  ?   Right Ear: Tympanic membrane, ear canal and external ear normal.  ?   Left Ear: Tympanic membrane, ear canal and external ear normal.  ?   Nose: Nose normal. No mucosal edema or rhinorrhea.  ?   Mouth/Throat:  ?   Pharynx: Uvula midline. No oropharyngeal exudate.  ?Eyes:  ?   Conjunctiva/sclera: Conjunctivae normal.  ?Neck:  ?   Thyroid: No thyromegaly.  ?   Trachea: Trachea normal. No tracheal tenderness or tracheal deviation.  ?Cardiovascular:  ?   Rate and Rhythm: Normal rate and regular rhythm.  ?   Heart sounds: Normal heart sounds, S1 normal  and S2 normal. No murmur heard. ?Pulmonary:  ?   Effort: No respiratory distress.  ?   Breath sounds: No stridor. Wheezing (Bilateral expiratory wheezing all lung fields) present. No rales.  ?Lymphadenopathy:  ?   Head:  ?   Right side of head: No tonsillar adenopathy.  ?   Left side of head: No tonsillar adenopathy.  ?   Cervical: No cervical adenopathy.  ?Skin: ?   Findings: No erythema or rash.  ?   Nails: There is no clubbing.  ?Neurological:  ?   Mental Status: She is alert.  ? ? ?Diagnostics:  ?  ?Spirometry was performed and demonstrated an FEV1 of 1.23 at 53 % of predicted. ? ?The patient had an Asthma Control Test with the following results: ACT Total Score: 20.   ? ?Assessment and Plan:  ? ?1. Asthma, not well controlled, severe persistent, with acute exacerbation   ?2. Gastroesophageal reflux disease, unspecified whether esophagitis present   ?3. Other eosinophilia   ? ? ?1.  Continue Breztri - 2 inhalations 1-2 times per day with spacer ? ?2.  Continue Albuterol HFA if needed ? ?3.  Continue immunotherapy ? ?4. For this episode: ? ? A. Prednisone 10 mg - 3 tablets now, 2 tablets this evening ? B. Prednisone 10 mg - 2 tabs daily x 5 days, then 1 tab daily x 5 days ? C. Continue Prilosec and Mucinex DM ? ?5.  Blood - CBC w/d.  Further treatment for exacerbations??? ? ?6.  Return to clinic in 6 months or earlier if needed ? ?I am going to assume that Angela [name] has a viral respiratory tract infection giving rise to significant inflammation of her lower airway and treat her with the therapy noted above.  It should be noted that this is her second exacerbation in 2023 and we may need to consider giving her a biologic agent especially given her pre-existing eosinophilia.  I am going to recheck her CBC with differential to see where she is at regarding eosinophilia and if she is going to have recurrent exacerbations as we move forward then we will need to consider administration of a biologic agent.  Given the fact  that this issue started at a beach house it is quite possible that the etiologic factor responsible for her inflammatory event is not a viral respiratory tract infection but a possible hypersensitivity pneumonitis reaction that we may need to further explore in the future if this is a recurrent event. ? ?Allena Katz, MD ?Allergy / Immunology ?[place] ?

## 2021-09-23 ENCOUNTER — Encounter: Payer: Self-pay | Admitting: Allergy and Immunology

## 2021-09-23 LAB — CBC WITH DIFFERENTIAL
Basophils Absolute: 0 10*3/uL (ref 0.0–0.2)
Basos: 1 %
EOS (ABSOLUTE): 0.1 10*3/uL (ref 0.0–0.4)
Eos: 1 %
Hematocrit: 42.5 % (ref 34.0–46.6)
Hemoglobin: 13.4 g/dL (ref 11.1–15.9)
Immature Grans (Abs): 0 10*3/uL (ref 0.0–0.1)
Immature Granulocytes: 0 %
Lymphocytes Absolute: 2.4 10*3/uL (ref 0.7–3.1)
Lymphs: 29 %
MCH: 27.4 pg (ref 26.6–33.0)
MCHC: 31.5 g/dL (ref 31.5–35.7)
MCV: 87 fL (ref 79–97)
Monocytes Absolute: 0.6 10*3/uL (ref 0.1–0.9)
Monocytes: 7 %
Neutrophils Absolute: 5 10*3/uL (ref 1.4–7.0)
Neutrophils: 62 %
RBC: 4.89 x10E6/uL (ref 3.77–5.28)
RDW: 13.3 % (ref 11.7–15.4)
WBC: 8.1 10*3/uL (ref 3.4–10.8)

## 2021-09-23 MED ORDER — BREZTRI AEROSPHERE 160-9-4.8 MCG/ACT IN AERO: 2.0000 | INHALATION_SPRAY | Freq: Two times a day (BID) | RESPIRATORY_TRACT | 5 refills | Status: DC

## 2021-09-23 MED ORDER — ALBUTEROL SULFATE HFA 108 (90 BASE) MCG/ACT IN AERS: 2.0000 | INHALATION_SPRAY | RESPIRATORY_TRACT | 2 refills | Status: DC | PRN

## 2021-09-30 ENCOUNTER — Ambulatory Visit (INDEPENDENT_AMBULATORY_CARE_PROVIDER_SITE_OTHER): Payer: BC Managed Care – PPO

## 2021-09-30 DIAGNOSIS — J309 Allergic rhinitis, unspecified: Secondary | ICD-10-CM

## 2021-10-06 ENCOUNTER — Ambulatory Visit (INDEPENDENT_AMBULATORY_CARE_PROVIDER_SITE_OTHER): Payer: BC Managed Care – PPO

## 2021-10-06 DIAGNOSIS — J309 Allergic rhinitis, unspecified: Secondary | ICD-10-CM | POA: Diagnosis not present

## 2021-10-12 ENCOUNTER — Ambulatory Visit (INDEPENDENT_AMBULATORY_CARE_PROVIDER_SITE_OTHER): Payer: BC Managed Care – PPO

## 2021-10-12 DIAGNOSIS — J309 Allergic rhinitis, unspecified: Secondary | ICD-10-CM

## 2021-10-26 ENCOUNTER — Ambulatory Visit (INDEPENDENT_AMBULATORY_CARE_PROVIDER_SITE_OTHER): Payer: BC Managed Care – PPO

## 2021-10-26 DIAGNOSIS — J309 Allergic rhinitis, unspecified: Secondary | ICD-10-CM

## 2021-11-02 ENCOUNTER — Ambulatory Visit (INDEPENDENT_AMBULATORY_CARE_PROVIDER_SITE_OTHER): Payer: BC Managed Care – PPO

## 2021-11-02 DIAGNOSIS — J309 Allergic rhinitis, unspecified: Secondary | ICD-10-CM | POA: Diagnosis not present

## 2021-11-10 ENCOUNTER — Ambulatory Visit (INDEPENDENT_AMBULATORY_CARE_PROVIDER_SITE_OTHER): Payer: BC Managed Care – PPO

## 2021-11-10 DIAGNOSIS — J309 Allergic rhinitis, unspecified: Secondary | ICD-10-CM | POA: Diagnosis not present

## 2021-11-23 ENCOUNTER — Ambulatory Visit (INDEPENDENT_AMBULATORY_CARE_PROVIDER_SITE_OTHER): Payer: BC Managed Care – PPO

## 2021-11-23 DIAGNOSIS — J309 Allergic rhinitis, unspecified: Secondary | ICD-10-CM | POA: Diagnosis not present

## 2021-11-24 ENCOUNTER — Other Ambulatory Visit: Payer: Self-pay | Admitting: Obstetrics and Gynecology

## 2021-11-24 DIAGNOSIS — Z1231 Encounter for screening mammogram for malignant neoplasm of breast: Secondary | ICD-10-CM

## 2021-12-07 ENCOUNTER — Ambulatory Visit (INDEPENDENT_AMBULATORY_CARE_PROVIDER_SITE_OTHER): Payer: BC Managed Care – PPO

## 2021-12-07 DIAGNOSIS — J309 Allergic rhinitis, unspecified: Secondary | ICD-10-CM | POA: Diagnosis not present

## 2021-12-23 DIAGNOSIS — M810 Age-related osteoporosis without current pathological fracture: Secondary | ICD-10-CM | POA: Diagnosis not present

## 2021-12-24 ENCOUNTER — Ambulatory Visit (INDEPENDENT_AMBULATORY_CARE_PROVIDER_SITE_OTHER): Payer: BC Managed Care – PPO

## 2021-12-24 DIAGNOSIS — J309 Allergic rhinitis, unspecified: Secondary | ICD-10-CM

## 2021-12-24 NOTE — Progress Notes (Signed)
VIALS EXP 12-25-22

## 2021-12-25 DIAGNOSIS — J3081 Allergic rhinitis due to animal (cat) (dog) hair and dander: Secondary | ICD-10-CM | POA: Diagnosis not present

## 2022-01-01 ENCOUNTER — Ambulatory Visit
Admission: RE | Admit: 2022-01-01 | Discharge: 2022-01-01 | Disposition: A | Discharge status: Home / Self Care | Payer: BC Managed Care – PPO | Source: Ambulatory Visit | Attending: Obstetrics and Gynecology | Admitting: Obstetrics and Gynecology

## 2022-01-01 DIAGNOSIS — Z1231 Encounter for screening mammogram for malignant neoplasm of breast: Secondary | ICD-10-CM | POA: Diagnosis not present

## 2022-01-04 ENCOUNTER — Ambulatory Visit (INDEPENDENT_AMBULATORY_CARE_PROVIDER_SITE_OTHER): Payer: BC Managed Care – PPO

## 2022-01-04 DIAGNOSIS — J309 Allergic rhinitis, unspecified: Secondary | ICD-10-CM | POA: Diagnosis not present

## 2022-01-18 ENCOUNTER — Ambulatory Visit (INDEPENDENT_AMBULATORY_CARE_PROVIDER_SITE_OTHER): Payer: BC Managed Care – PPO

## 2022-01-18 DIAGNOSIS — J309 Allergic rhinitis, unspecified: Secondary | ICD-10-CM

## 2022-02-03 ENCOUNTER — Ambulatory Visit (INDEPENDENT_AMBULATORY_CARE_PROVIDER_SITE_OTHER): Payer: BC Managed Care – PPO

## 2022-02-03 DIAGNOSIS — J309 Allergic rhinitis, unspecified: Secondary | ICD-10-CM

## 2022-02-18 ENCOUNTER — Ambulatory Visit (INDEPENDENT_AMBULATORY_CARE_PROVIDER_SITE_OTHER): Payer: BC Managed Care – PPO | Admitting: *Deleted

## 2022-02-18 DIAGNOSIS — J309 Allergic rhinitis, unspecified: Secondary | ICD-10-CM

## 2022-02-25 ENCOUNTER — Ambulatory Visit (INDEPENDENT_AMBULATORY_CARE_PROVIDER_SITE_OTHER): Payer: BC Managed Care – PPO

## 2022-02-25 DIAGNOSIS — J309 Allergic rhinitis, unspecified: Secondary | ICD-10-CM

## 2022-03-01 ENCOUNTER — Ambulatory Visit (INDEPENDENT_AMBULATORY_CARE_PROVIDER_SITE_OTHER): Payer: BC Managed Care – PPO | Admitting: *Deleted

## 2022-03-01 DIAGNOSIS — J309 Allergic rhinitis, unspecified: Secondary | ICD-10-CM

## 2022-03-09 ENCOUNTER — Encounter: Payer: Self-pay | Admitting: Allergy and Immunology

## 2022-03-09 ENCOUNTER — Ambulatory Visit: Payer: BC Managed Care – PPO | Admitting: Allergy and Immunology

## 2022-03-09 VITALS — BP 110/84 | HR 103 | Temp 97.7°F | Resp 16 | Ht 63.0 in | Wt 146.6 lb

## 2022-03-09 DIAGNOSIS — J4550 Severe persistent asthma, uncomplicated: Secondary | ICD-10-CM

## 2022-03-09 DIAGNOSIS — J309 Allergic rhinitis, unspecified: Secondary | ICD-10-CM

## 2022-03-09 NOTE — Patient Instructions (Signed)
  1.  Continue Breztri - 2 inhalations 1-2 times per day with spacer  2.  Continue Albuterol HFA if needed  3.  Continue immunotherapy  4.  Obtain flu vaccine and RSV vaccine.  COVID-vaccine???  5.  Return to clinic in 6 months or earlier if needed

## 2022-03-09 NOTE — Progress Notes (Unsigned)
[place] - [place] - [place]   Follow-up Note  Referring Provider: Billie Ruddy, MD Primary Provider: Billie Ruddy, MD Date of Office Visit: 03/09/2022  Subjective:   Angela [name] (DOB: Apr 03, 1956) is a 66 y.o. female who returns to the [facility] and [place] on 03/09/2022 in re-evaluation of the following:  HPI: Angela [name] returns to this clinic in evaluation of asthma.  Her last visit to this clinic was 22 September 2021.  She has really done well and has not used any albuterol and is exercising without any difficulty and has not required a systemic steroid or antibiotic while she continues on [place] mostly 1 time per day and immunotherapy.  Allergies as of 03/09/2022       Reactions   Nsaids Other (See Comments)   BP RISE   Codeine Nausea And Vomiting        Medication List    albuterol 108 (90 Base) MCG/ACT inhaler Commonly known as: Ventolin HFA Inhale 2 puffs into the lungs every 4 (four) hours as needed for wheezing or shortness of breath. 2 puffs every 4 hours if needed   Breztri Aerosphere 160-9-4.8 MCG/ACT Aero Generic drug: Budeson-Glycopyrrol-Formoterol Inhale 2 puffs into the lungs in the morning and at bedtime.   estradiol 1.53 MG/SPRAY transdermal spray Commonly known as: EVAMIST Place 2 sprays onto the skin daily.   potassium chloride SA 20 MEQ tablet Commonly known as: KLOR-CON M TAKE 1 TABLET(20 MEQ) BY MOUTH DAILY   progesterone 100 MG capsule Commonly known as: PROMETRIUM Take 100 mg by mouth at bedtime.   triamterene-hydrochlorothiazide 37.5-25 MG tablet Commonly known as: MAXZIDE-25 Take 1 tablet by mouth daily.   Vitamin D2 10 MCG (400 UNIT) Tabs Take 1 tablet by mouth daily.      Past Medical History:  Diagnosis Date   Asthma    Diverticulosis    Elevated C-reactive protein (CRP)    Esophageal stricture    GERD (gastroesophageal reflux disease)    HH (hiatus hernia)    Hypertension     MVP (mitral valve prolapse)    Osteoporosis    Renal calculus     Past Surgical History:  Procedure Laterality Date   Broken Elbow Right    Broken Finger Right    Ring Finger    Review of systems negative except as noted in HPI / PMHx or noted below:  Review of Systems  Constitutional: Negative.   HENT: Negative.    Eyes: Negative.   Respiratory: Negative.    Cardiovascular: Negative.   Gastrointestinal: Negative.   Genitourinary: Negative.   Musculoskeletal: Negative.   Skin: Negative.   Neurological: Negative.   Endo/Heme/Allergies: Negative.   Psychiatric/Behavioral: Negative.       Objective:   Vitals:   03/09/22 1029  BP: 110/84  Pulse: (!) 103  Resp: 16  Temp: 97.7 F (36.5 C)  SpO2: 99%   Height: '5\' 3"'$  (160 cm)  Weight: 146 lb 9.6 oz (66.5 kg)   Physical Exam Constitutional:      Appearance: She is not diaphoretic.  HENT:     Head: Normocephalic.     Right Ear: Tympanic membrane, ear canal and external ear normal.     Left Ear: Tympanic membrane, ear canal and external ear normal.     Nose: Nose normal. No mucosal edema or rhinorrhea.     Mouth/Throat:     Pharynx: Uvula midline. No oropharyngeal exudate.  Eyes:  Conjunctiva/sclera: Conjunctivae normal.  Neck:     Thyroid: No thyromegaly.     Trachea: Trachea normal. No tracheal tenderness or tracheal deviation.  Cardiovascular:     Rate and Rhythm: Normal rate and regular rhythm.     Heart sounds: Normal heart sounds, S1 normal and S2 normal. No murmur heard. Pulmonary:     Effort: No respiratory distress.     Breath sounds: Normal breath sounds. No stridor. No wheezing or rales.  Lymphadenopathy:     Head:     Right side of head: No tonsillar adenopathy.     Left side of head: No tonsillar adenopathy.     Cervical: No cervical adenopathy.  Skin:    Findings: No erythema or rash.     Nails: There is no clubbing.  Neurological:     Mental Status: She is alert.     Diagnostics:     Spirometry was performed and demonstrated an FEV1 of 1.82 at 77 % of predicted.  The patient had an Asthma Control Test with the following results:  .   Results of blood tests obtained 22 September 2021 identified WBC 8.1, absolute eosinophil 100, absolute lymphocyte 2400, hemoglobin 13  Assessment and Plan:   1. Allergic rhinitis, unspecified seasonality, unspecified trigger     1.  Continue Breztri - 2 inhalations 1-2 times per day with spacer  2.  Continue Albuterol HFA if needed  3.  Continue immunotherapy  4.  Obtain flu vaccine and RSV vaccine.  COVID-vaccine???  5.  Return to clinic in 6 months or earlier if needed  Angela [name] is really doing very well on her current therapy and she will continue on a dose of anti-inflammatory agents for her airway as noted above and continue on immunotherapy.  I encouraged her to obtain the flu vaccine and RSV vaccine.  Her previous history concerning COVID immunization status includes a full set of vaccinations and her last COVID infection was a very mild.  I am not sure she has been a benefit from an additional COVID-vaccine of her last COVID event was a very mild infection.  Allena Katz, MD Allergy / Immunology [place]

## 2022-03-10 ENCOUNTER — Encounter: Payer: Self-pay | Admitting: Allergy and Immunology

## 2022-03-10 MED ORDER — SPACER/AERO-HOLDING CHAMBERS DEVI: 1.0000 | 1 refills | Status: DC

## 2022-03-10 MED ORDER — BREZTRI AEROSPHERE 160-9-4.8 MCG/ACT IN AERO: 2.0000 | INHALATION_SPRAY | Freq: Two times a day (BID) | RESPIRATORY_TRACT | 5 refills | Status: DC

## 2022-03-10 MED ORDER — ALBUTEROL SULFATE HFA 108 (90 BASE) MCG/ACT IN AERS: 2.0000 | INHALATION_SPRAY | RESPIRATORY_TRACT | 2 refills | Status: DC | PRN

## 2022-03-10 NOTE — Addendum Note (Signed)
Addended by: Clovis Cao A on: 03/10/2022 10:40 AM   Modules accepted: Orders

## 2022-03-22 ENCOUNTER — Ambulatory Visit (INDEPENDENT_AMBULATORY_CARE_PROVIDER_SITE_OTHER): Payer: BC Managed Care – PPO

## 2022-03-22 DIAGNOSIS — J309 Allergic rhinitis, unspecified: Secondary | ICD-10-CM

## 2022-04-05 ENCOUNTER — Ambulatory Visit (INDEPENDENT_AMBULATORY_CARE_PROVIDER_SITE_OTHER): Payer: BC Managed Care – PPO

## 2022-04-05 DIAGNOSIS — J309 Allergic rhinitis, unspecified: Secondary | ICD-10-CM

## 2022-04-26 ENCOUNTER — Ambulatory Visit (INDEPENDENT_AMBULATORY_CARE_PROVIDER_SITE_OTHER): Payer: BC Managed Care – PPO

## 2022-04-26 DIAGNOSIS — J309 Allergic rhinitis, unspecified: Secondary | ICD-10-CM

## 2022-05-12 ENCOUNTER — Ambulatory Visit (INDEPENDENT_AMBULATORY_CARE_PROVIDER_SITE_OTHER): Payer: BC Managed Care – PPO

## 2022-05-12 DIAGNOSIS — J309 Allergic rhinitis, unspecified: Secondary | ICD-10-CM

## 2022-05-13 DIAGNOSIS — Z01419 Encounter for gynecological examination (general) (routine) without abnormal findings: Secondary | ICD-10-CM | POA: Diagnosis not present

## 2022-05-13 DIAGNOSIS — Z0000 Encounter for general adult medical examination without abnormal findings: Secondary | ICD-10-CM | POA: Diagnosis not present

## 2022-05-13 DIAGNOSIS — Z6827 Body mass index (BMI) 27.0-27.9, adult: Secondary | ICD-10-CM | POA: Diagnosis not present

## 2022-05-13 DIAGNOSIS — Z13220 Encounter for screening for lipoid disorders: Secondary | ICD-10-CM | POA: Diagnosis not present

## 2022-05-13 DIAGNOSIS — Z1329 Encounter for screening for other suspected endocrine disorder: Secondary | ICD-10-CM | POA: Diagnosis not present

## 2022-05-13 DIAGNOSIS — M810 Age-related osteoporosis without current pathological fracture: Secondary | ICD-10-CM | POA: Diagnosis not present

## 2022-05-13 DIAGNOSIS — Z131 Encounter for screening for diabetes mellitus: Secondary | ICD-10-CM | POA: Diagnosis not present

## 2022-05-19 DIAGNOSIS — J3089 Other allergic rhinitis: Secondary | ICD-10-CM | POA: Diagnosis not present

## 2022-05-19 NOTE — Progress Notes (Signed)
VIALS EXP 05-20-23 

## 2022-05-28 ENCOUNTER — Encounter: Payer: Self-pay | Admitting: Allergy and Immunology

## 2022-05-31 NOTE — Telephone Encounter (Signed)
Please inform Angela [name] that we will treat this issue preventatively and see what happens as we move forward. Please have her use a claritin 10 mg or a Zyrtec 10 mg on morning of immunotherapy.

## 2022-06-01 ENCOUNTER — Ambulatory Visit (INDEPENDENT_AMBULATORY_CARE_PROVIDER_SITE_OTHER): Payer: BC Managed Care – PPO | Admitting: Allergy & Immunology

## 2022-06-01 DIAGNOSIS — J309 Allergic rhinitis, unspecified: Secondary | ICD-10-CM | POA: Diagnosis not present

## 2022-06-08 DIAGNOSIS — M79672 Pain in left foot: Secondary | ICD-10-CM | POA: Diagnosis not present

## 2022-06-16 ENCOUNTER — Ambulatory Visit (INDEPENDENT_AMBULATORY_CARE_PROVIDER_SITE_OTHER): Payer: BC Managed Care – PPO

## 2022-06-16 DIAGNOSIS — J309 Allergic rhinitis, unspecified: Secondary | ICD-10-CM

## 2022-06-18 DIAGNOSIS — S92355A Nondisplaced fracture of fifth metatarsal bone, left foot, initial encounter for closed fracture: Secondary | ICD-10-CM | POA: Diagnosis not present

## 2022-06-18 DIAGNOSIS — M79672 Pain in left foot: Secondary | ICD-10-CM | POA: Diagnosis not present

## 2022-06-20 HISTORY — PX: FOOT FRACTURE SURGERY: SHX645

## 2022-06-22 DIAGNOSIS — G8918 Other acute postprocedural pain: Secondary | ICD-10-CM | POA: Diagnosis not present

## 2022-06-22 DIAGNOSIS — S92355A Nondisplaced fracture of fifth metatarsal bone, left foot, initial encounter for closed fracture: Secondary | ICD-10-CM | POA: Diagnosis not present

## 2022-06-30 ENCOUNTER — Ambulatory Visit (INDEPENDENT_AMBULATORY_CARE_PROVIDER_SITE_OTHER): Payer: BC Managed Care – PPO

## 2022-06-30 DIAGNOSIS — J309 Allergic rhinitis, unspecified: Secondary | ICD-10-CM | POA: Diagnosis not present

## 2022-07-15 ENCOUNTER — Ambulatory Visit (INDEPENDENT_AMBULATORY_CARE_PROVIDER_SITE_OTHER): Payer: BC Managed Care – PPO

## 2022-07-15 DIAGNOSIS — J309 Allergic rhinitis, unspecified: Secondary | ICD-10-CM | POA: Diagnosis not present

## 2022-07-21 ENCOUNTER — Ambulatory Visit (INDEPENDENT_AMBULATORY_CARE_PROVIDER_SITE_OTHER): Payer: BC Managed Care – PPO

## 2022-07-21 DIAGNOSIS — J309 Allergic rhinitis, unspecified: Secondary | ICD-10-CM | POA: Diagnosis not present

## 2022-07-27 ENCOUNTER — Ambulatory Visit (INDEPENDENT_AMBULATORY_CARE_PROVIDER_SITE_OTHER): Payer: BC Managed Care – PPO

## 2022-07-27 DIAGNOSIS — J309 Allergic rhinitis, unspecified: Secondary | ICD-10-CM

## 2022-08-04 ENCOUNTER — Ambulatory Visit (INDEPENDENT_AMBULATORY_CARE_PROVIDER_SITE_OTHER): Payer: BC Managed Care – PPO

## 2022-08-04 DIAGNOSIS — J309 Allergic rhinitis, unspecified: Secondary | ICD-10-CM | POA: Diagnosis not present

## 2022-08-04 DIAGNOSIS — S92355A Nondisplaced fracture of fifth metatarsal bone, left foot, initial encounter for closed fracture: Secondary | ICD-10-CM | POA: Diagnosis not present

## 2022-08-12 ENCOUNTER — Ambulatory Visit (INDEPENDENT_AMBULATORY_CARE_PROVIDER_SITE_OTHER): Payer: BC Managed Care – PPO

## 2022-08-12 DIAGNOSIS — J309 Allergic rhinitis, unspecified: Secondary | ICD-10-CM

## 2022-08-25 ENCOUNTER — Ambulatory Visit (INDEPENDENT_AMBULATORY_CARE_PROVIDER_SITE_OTHER): Payer: BC Managed Care – PPO

## 2022-08-25 DIAGNOSIS — J309 Allergic rhinitis, unspecified: Secondary | ICD-10-CM | POA: Diagnosis not present

## 2022-09-01 ENCOUNTER — Other Ambulatory Visit: Payer: Self-pay | Admitting: Family Medicine

## 2022-09-01 DIAGNOSIS — S92355D Nondisplaced fracture of fifth metatarsal bone, left foot, subsequent encounter for fracture with routine healing: Secondary | ICD-10-CM | POA: Diagnosis not present

## 2022-09-07 ENCOUNTER — Ambulatory Visit: Payer: BC Managed Care – PPO | Admitting: Allergy and Immunology

## 2022-09-08 ENCOUNTER — Ambulatory Visit (INDEPENDENT_AMBULATORY_CARE_PROVIDER_SITE_OTHER): Payer: BC Managed Care – PPO

## 2022-09-08 DIAGNOSIS — J309 Allergic rhinitis, unspecified: Secondary | ICD-10-CM | POA: Diagnosis not present

## 2022-09-14 ENCOUNTER — Ambulatory Visit: Payer: BC Managed Care – PPO | Admitting: Allergy and Immunology

## 2022-09-20 DIAGNOSIS — R262 Difficulty in walking, not elsewhere classified: Secondary | ICD-10-CM | POA: Diagnosis not present

## 2022-09-20 DIAGNOSIS — S92352S Displaced fracture of fifth metatarsal bone, left foot, sequela: Secondary | ICD-10-CM | POA: Diagnosis not present

## 2022-09-20 DIAGNOSIS — R29898 Other symptoms and signs involving the musculoskeletal system: Secondary | ICD-10-CM | POA: Diagnosis not present

## 2022-09-21 ENCOUNTER — Encounter: Payer: Self-pay | Admitting: Allergy and Immunology

## 2022-09-21 ENCOUNTER — Ambulatory Visit: Payer: Self-pay | Admitting: *Deleted

## 2022-09-21 ENCOUNTER — Ambulatory Visit: Payer: BC Managed Care – PPO | Admitting: Allergy and Immunology

## 2022-09-21 ENCOUNTER — Other Ambulatory Visit: Payer: Self-pay

## 2022-09-21 VITALS — BP 130/88 | HR 90 | Temp 98.1°F | Resp 16 | Ht 62.0 in | Wt 151.9 lb

## 2022-09-21 DIAGNOSIS — M8000XD Age-related osteoporosis with current pathological fracture, unspecified site, subsequent encounter for fracture with routine healing: Secondary | ICD-10-CM | POA: Diagnosis not present

## 2022-09-21 DIAGNOSIS — J309 Allergic rhinitis, unspecified: Secondary | ICD-10-CM

## 2022-09-21 DIAGNOSIS — J4550 Severe persistent asthma, uncomplicated: Secondary | ICD-10-CM

## 2022-09-21 MED ORDER — BREZTRI AEROSPHERE 160-9-4.8 MCG/ACT IN AERO: 2.0000 | INHALATION_SPRAY | Freq: Two times a day (BID) | RESPIRATORY_TRACT | 5 refills | Status: DC

## 2022-09-21 MED ORDER — ALBUTEROL SULFATE HFA 108 (90 BASE) MCG/ACT IN AERS: 2.0000 | INHALATION_SPRAY | RESPIRATORY_TRACT | 2 refills | Status: DC | PRN

## 2022-09-21 NOTE — Progress Notes (Unsigned)
[place] - [place] - [place] - [place] - [place]   Follow-up Note  Referring Provider: Deeann Saint, MD Primary Provider: Deeann Saint, MD Date of Office Visit: 09/21/2022  Subjective:   [name] Joubert (DOB: [birth year]) is a 67 y.o. female who [facility] on 09[facility]21/2022 in re-evaluation of the following:  HPI: [name] returns to this clinic in evaluation of asthma.  I last saw her in this clinic 09 March 2022.  She is been doing well with her asthma while using immunotherapy currently at every 2 weeks.  She did develop a large local reaction to some of her immunotherapy over the course of the past month or so but that has since decreased and every time she gets an injection at this point in time she has a smaller size of her large local reaction and it is never associated with any systemic or constitutional symptoms.  She has a rare requirement for albuterol while she continues to use her Markus Daft just 1 time per day.  She fractured a bone in her right foot.  She has untreated osteoporosis.  Allergies as of 09/21/2022       Reactions   Codeine Nausea And Vomiting, Diarrhea, Nausea Only   Nsaids Other (See Comments)   BP RISE   Pollen Extract Itching        Medication List    albuterol 108 (90 Base) MCG/ACT inhaler Commonly known as: Ventolin HFA Inhale 2 puffs into the lungs every 4 (four) hours as needed for wheezing or shortness of breath. 2 puffs every 4 hours if needed   Breztri Aerosphere 160-9-4.8 MCG/ACT Aero Generic drug: Budeson-Glycopyrrol-Formoterol Inhale 2 puffs into the lungs in the morning and at bedtime.   estradiol 1.53 MG/SPRAY transdermal spray Commonly known as: EVAMIST Place 2 sprays onto the skin daily.   potassium chloride SA 20 MEQ tablet Commonly known as: KLOR-CON M TAKE 1 TABLET(20 MEQ) BY MOUTH DAILY   progesterone 100 MG capsule Commonly known as: PROMETRIUM Take 100 mg by mouth at  bedtime.   Spacer/Aero-Holding [employer] 1 Device by Does not apply route as directed.   triamterene-hydrochlorothiazide 37.5-25 MG tablet Commonly known as: MAXZIDE-25 Take 1 tablet by mouth daily.   triamterene-hydrochlorothiazide 37.5-25 MG capsule Commonly known as: DYAZIDE TAKE 1 CAPSULE BY MOUTH EVERY DAY   Vitamin D2 10 MCG (400 UNIT) Tabs Take 1 tablet by mouth daily.    Past Medical History:  Diagnosis Date   Asthma    Diverticulosis    Elevated C-reactive protein (CRP)    Esophageal stricture    GERD (gastroesophageal reflux disease)    HH (hiatus hernia)    Hypertension    MVP (mitral valve prolapse)    Osteoporosis    Renal calculus     Past Surgical History:  Procedure Laterality Date   Broken Elbow Right    Broken Finger Right    Ring Finger   FOOT FRACTURE SURGERY Left 06/20/2022    Review of systems negative except as noted in HPI / PMHx or noted below:  Review of Systems  Constitutional: Negative.   HENT: Negative.    Eyes: Negative.   Respiratory: Negative.    Cardiovascular: Negative.   Gastrointestinal: Negative.   Genitourinary: Negative.   Musculoskeletal: Negative.   Skin: Negative.   Neurological: Negative.   Endo/Heme/Allergies: Negative.   Psychiatric/Behavioral: Negative.       Objective:   Vitals:   09/21/22 1608  BP:  130/88  Pulse: 90  Resp: 16  Temp: 98.1 F (36.7 C)  SpO2: 97%   Height: 5\' 2"  (157.5 cm)  Weight: 151 lb 14.4 oz (68.9 kg)   Physical Exam Constitutional:      Appearance: She is not diaphoretic.  HENT:     Head: Normocephalic.     Right Ear: Tympanic membrane, ear canal and external ear normal.     Left Ear: Tympanic membrane, ear canal and external ear normal.     Nose: Nose normal. No mucosal edema or rhinorrhea.     Mouth/Throat:     Pharynx: Uvula midline. No oropharyngeal exudate.  Eyes:     Conjunctiva/sclera: Conjunctivae normal.  Neck:     Thyroid: No thyromegaly.     Trachea:  Trachea normal. No tracheal tenderness or tracheal deviation.  Cardiovascular:     Rate and Rhythm: Normal rate and regular rhythm.     Heart sounds: Normal heart sounds, S1 normal and S2 normal. No murmur heard. Pulmonary:     Effort: No respiratory distress.     Breath sounds: Normal breath sounds. No stridor. No wheezing or rales.  Lymphadenopathy:     Head:     Right side of head: No tonsillar adenopathy.     Left side of head: No tonsillar adenopathy.     Cervical: No cervical adenopathy.  Skin:    Findings: No erythema or rash.     Nails: There is no clubbing.  Neurological:     Mental Status: She is alert.     Diagnostics:   Results of a bone density scan obtained 30 March 2021 identifies the following:  AP Spine  L1-L4      03/30/2021    63.6         -3.6    0.752 g/cm2 AP Spine  L1-L4      03/13/2019    61.6         -3.4    0.769 g/cm2   DualFemur Total Left 03/30/2021    63.6         -3.0    0.627 g/cm2 DualFemur Total Left 03/13/2019    61.6         -3.1    0.615 g/cm2   DualFemur Total Mean 03/30/2021    63.6         -2.8    0.655 g/cm2 DualFemur Total Mean 03/13/2019    61.6         -2.9    0.648 g/cm2  Assessment and Plan:   1. Asthma, severe persistent, well-controlled   2. Age-related osteoporosis with current pathological fracture with routine healing, subsequent encounter     1.  Continue Breztri - 2 inhalations 1-2 times per day with spacer  2.  Continue Albuterol HFA if needed  3.  Continue immunotherapy  4. Discuss with endocrinologist about therapy for osteoporosis   5.  Return to clinic in 12 months or earlier if needed  [name] is really doing very well and we will continue to have her use immunotherapy and per history to address her atopic asthma.  She has untreated osteoporosis I have asked her to discuss with her endocrinologist about possibly starting some therapy for this condition especially given the fact that she fractured a bone in her  foot.  I will see her back in this clinic in 12 weeks.  Laurette Schimke, MD Allergy / Immunology  [facility]

## 2022-09-21 NOTE — Patient Instructions (Addendum)
  1.  Continue Breztri - 2 inhalations 1-2 times per day with spacer  2.  Continue Albuterol HFA if needed  3.  Continue immunotherapy  4. Discuss with endocrinologist about therapy for osteoporosis   5.  Return to clinic in 12 months or earlier if needed

## 2022-10-04 ENCOUNTER — Ambulatory Visit (INDEPENDENT_AMBULATORY_CARE_PROVIDER_SITE_OTHER): Payer: BC Managed Care – PPO

## 2022-10-04 DIAGNOSIS — J309 Allergic rhinitis, unspecified: Secondary | ICD-10-CM | POA: Diagnosis not present

## 2022-10-07 DIAGNOSIS — R29898 Other symptoms and signs involving the musculoskeletal system: Secondary | ICD-10-CM | POA: Diagnosis not present

## 2022-10-07 DIAGNOSIS — S92352S Displaced fracture of fifth metatarsal bone, left foot, sequela: Secondary | ICD-10-CM | POA: Diagnosis not present

## 2022-10-07 DIAGNOSIS — R262 Difficulty in walking, not elsewhere classified: Secondary | ICD-10-CM | POA: Diagnosis not present

## 2022-10-12 DIAGNOSIS — R262 Difficulty in walking, not elsewhere classified: Secondary | ICD-10-CM | POA: Diagnosis not present

## 2022-10-12 DIAGNOSIS — R29898 Other symptoms and signs involving the musculoskeletal system: Secondary | ICD-10-CM | POA: Diagnosis not present

## 2022-10-12 DIAGNOSIS — S92352S Displaced fracture of fifth metatarsal bone, left foot, sequela: Secondary | ICD-10-CM | POA: Diagnosis not present

## 2022-10-18 ENCOUNTER — Ambulatory Visit (INDEPENDENT_AMBULATORY_CARE_PROVIDER_SITE_OTHER): Payer: BC Managed Care – PPO | Admitting: *Deleted

## 2022-10-18 DIAGNOSIS — J309 Allergic rhinitis, unspecified: Secondary | ICD-10-CM

## 2022-10-19 ENCOUNTER — Other Ambulatory Visit: Payer: Self-pay | Admitting: Obstetrics and Gynecology

## 2022-10-19 DIAGNOSIS — M810 Age-related osteoporosis without current pathological fracture: Secondary | ICD-10-CM

## 2022-10-19 DIAGNOSIS — J3089 Other allergic rhinitis: Secondary | ICD-10-CM | POA: Diagnosis not present

## 2022-10-19 NOTE — Progress Notes (Signed)
VIALS EXP 10-19-23 

## 2022-10-21 DIAGNOSIS — R262 Difficulty in walking, not elsewhere classified: Secondary | ICD-10-CM | POA: Diagnosis not present

## 2022-10-21 DIAGNOSIS — R29898 Other symptoms and signs involving the musculoskeletal system: Secondary | ICD-10-CM | POA: Diagnosis not present

## 2022-10-21 DIAGNOSIS — S92352S Displaced fracture of fifth metatarsal bone, left foot, sequela: Secondary | ICD-10-CM | POA: Diagnosis not present

## 2022-10-22 ENCOUNTER — Ambulatory Visit
Admission: RE | Admit: 2022-10-22 | Discharge: 2022-10-22 | Disposition: A | Discharge status: Home / Self Care | Payer: BC Managed Care – PPO | Source: Ambulatory Visit | Attending: Obstetrics and Gynecology | Admitting: Obstetrics and Gynecology

## 2022-10-22 DIAGNOSIS — Z13820 Encounter for screening for osteoporosis: Secondary | ICD-10-CM | POA: Diagnosis not present

## 2022-10-22 DIAGNOSIS — M810 Age-related osteoporosis without current pathological fracture: Secondary | ICD-10-CM

## 2022-10-25 DIAGNOSIS — R29898 Other symptoms and signs involving the musculoskeletal system: Secondary | ICD-10-CM | POA: Diagnosis not present

## 2022-10-25 DIAGNOSIS — S92352S Displaced fracture of fifth metatarsal bone, left foot, sequela: Secondary | ICD-10-CM | POA: Diagnosis not present

## 2022-10-25 DIAGNOSIS — R262 Difficulty in walking, not elsewhere classified: Secondary | ICD-10-CM | POA: Diagnosis not present

## 2022-10-27 DIAGNOSIS — S92355D Nondisplaced fracture of fifth metatarsal bone, left foot, subsequent encounter for fracture with routine healing: Secondary | ICD-10-CM | POA: Diagnosis not present

## 2022-11-03 ENCOUNTER — Ambulatory Visit (INDEPENDENT_AMBULATORY_CARE_PROVIDER_SITE_OTHER): Payer: BC Managed Care – PPO

## 2022-11-03 DIAGNOSIS — J309 Allergic rhinitis, unspecified: Secondary | ICD-10-CM

## 2022-11-03 DIAGNOSIS — R29898 Other symptoms and signs involving the musculoskeletal system: Secondary | ICD-10-CM | POA: Diagnosis not present

## 2022-11-03 DIAGNOSIS — S92352S Displaced fracture of fifth metatarsal bone, left foot, sequela: Secondary | ICD-10-CM | POA: Diagnosis not present

## 2022-11-03 DIAGNOSIS — R262 Difficulty in walking, not elsewhere classified: Secondary | ICD-10-CM | POA: Diagnosis not present

## 2022-11-22 ENCOUNTER — Ambulatory Visit (INDEPENDENT_AMBULATORY_CARE_PROVIDER_SITE_OTHER): Payer: BC Managed Care – PPO

## 2022-11-22 DIAGNOSIS — J309 Allergic rhinitis, unspecified: Secondary | ICD-10-CM | POA: Diagnosis not present

## 2022-11-25 DIAGNOSIS — M810 Age-related osteoporosis without current pathological fracture: Secondary | ICD-10-CM | POA: Diagnosis not present

## 2022-11-30 ENCOUNTER — Ambulatory Visit (INDEPENDENT_AMBULATORY_CARE_PROVIDER_SITE_OTHER): Payer: BC Managed Care – PPO | Admitting: *Deleted

## 2022-11-30 DIAGNOSIS — J309 Allergic rhinitis, unspecified: Secondary | ICD-10-CM | POA: Diagnosis not present

## 2022-12-07 ENCOUNTER — Ambulatory Visit (INDEPENDENT_AMBULATORY_CARE_PROVIDER_SITE_OTHER): Payer: BC Managed Care – PPO | Admitting: *Deleted

## 2022-12-07 DIAGNOSIS — J309 Allergic rhinitis, unspecified: Secondary | ICD-10-CM

## 2022-12-13 ENCOUNTER — Ambulatory Visit (INDEPENDENT_AMBULATORY_CARE_PROVIDER_SITE_OTHER): Payer: BC Managed Care – PPO

## 2022-12-13 DIAGNOSIS — J309 Allergic rhinitis, unspecified: Secondary | ICD-10-CM

## 2022-12-21 DIAGNOSIS — R29898 Other symptoms and signs involving the musculoskeletal system: Secondary | ICD-10-CM | POA: Diagnosis not present

## 2022-12-21 DIAGNOSIS — R262 Difficulty in walking, not elsewhere classified: Secondary | ICD-10-CM | POA: Diagnosis not present

## 2022-12-21 DIAGNOSIS — S92352S Displaced fracture of fifth metatarsal bone, left foot, sequela: Secondary | ICD-10-CM | POA: Diagnosis not present

## 2022-12-22 ENCOUNTER — Ambulatory Visit: Payer: Self-pay

## 2022-12-22 DIAGNOSIS — J309 Allergic rhinitis, unspecified: Secondary | ICD-10-CM | POA: Diagnosis not present

## 2023-01-05 DIAGNOSIS — R262 Difficulty in walking, not elsewhere classified: Secondary | ICD-10-CM | POA: Diagnosis not present

## 2023-01-05 DIAGNOSIS — S92352S Displaced fracture of fifth metatarsal bone, left foot, sequela: Secondary | ICD-10-CM | POA: Diagnosis not present

## 2023-01-05 DIAGNOSIS — R29898 Other symptoms and signs involving the musculoskeletal system: Secondary | ICD-10-CM | POA: Diagnosis not present

## 2023-01-07 ENCOUNTER — Other Ambulatory Visit: Payer: Self-pay

## 2023-01-07 ENCOUNTER — Ambulatory Visit: Payer: BC Managed Care – PPO | Admitting: Internal Medicine

## 2023-01-07 ENCOUNTER — Telehealth: Payer: Self-pay

## 2023-01-07 VITALS — BP 148/98 | HR 92 | Temp 98.9°F | Ht 62.0 in | Wt 144.3 lb

## 2023-01-07 DIAGNOSIS — M8000XD Age-related osteoporosis with current pathological fracture, unspecified site, subsequent encounter for fracture with routine healing: Secondary | ICD-10-CM | POA: Diagnosis not present

## 2023-01-07 DIAGNOSIS — K219 Gastro-esophageal reflux disease without esophagitis: Secondary | ICD-10-CM

## 2023-01-07 DIAGNOSIS — J4550 Severe persistent asthma, uncomplicated: Secondary | ICD-10-CM

## 2023-01-07 MED ORDER — PANTOPRAZOLE SODIUM 40 MG PO TBEC: 40.0000 mg | DELAYED_RELEASE_TABLET | Freq: Every day | ORAL | 0 refills | Status: DC

## 2023-01-07 MED ORDER — BREZTRI AEROSPHERE 160-9-4.8 MCG/ACT IN AERO: 2.0000 | INHALATION_SPRAY | Freq: Two times a day (BID) | RESPIRATORY_TRACT | 11 refills | Status: DC

## 2023-01-07 MED ORDER — ALBUTEROL SULFATE HFA 108 (90 BASE) MCG/ACT IN AERS: 2.0000 | INHALATION_SPRAY | RESPIRATORY_TRACT | 2 refills | Status: DC | PRN

## 2023-01-07 NOTE — Progress Notes (Signed)
FOLLOW UP Date of Service/Encounter:  01/07/23   Subjective:  Angela [name] (DOB: [birth year]) is a 67 y.o. female who [facility] for follow up for an acute visit severe persistent asthma.   History obtained from: chart review and patient. Last visit was on 09/21/2022 with Dr. [name] and was doing well with Breztri 2 puffs 1-2 times/day and HFA PRN.  Also on AIT.  Discussed [facility] visit for osteoporosis.   Reports the past week, she has noticed wheezing and chest congestion with mucous but mostly in the upper airfields near her neck. Has also had coughing with this.  Tried albuterol and it did help.  Prior to this, she has done very well; no ER visits/oral prednisone in years.  Was well controlled on Breztri 2 puffs daily and never needed her Albuterol.  She does have osteoporosis so has not tried increasing the [employer].  Denies any congestion, rhinorrhea, post nasal drip with this.  Does have heartburn once a month requiring Gaviscon PRN; her symptoms described above are present in the morning. She used to be on PPI daily for about 5 years in the past but has stopped it with her osteoporosis.   Past Medical History: Past Medical History:  Diagnosis Date   Asthma    Diverticulosis    Elevated C-reactive protein (CRP)    Esophageal stricture    GERD (gastroesophageal reflux disease)    HH (hiatus hernia)    Hypertension    MVP (mitral valve prolapse)    Osteoporosis    Renal calculus     Objective:  BP (!) 148/98   Pulse 92   Temp 98.9 F (37.2 C)   Ht 5\' 2"  (1.575 m)   Wt 144 lb 4.8 oz (65.5 kg)   SpO2 96%   BMI 26.39 kg/m  Body mass index is 26.39 kg/m. Physical Exam: GEN: alert, well developed HEENT: clear conjunctiva, nose without turbinate hypertrophy, no rhinorrhea, no cobblestoning HEART: regular rate and rhythm, no murmur LUNGS: clear to auscultation bilaterally, no coughing, unlabored respiration SKIN: no rashes or  lesions  Spirometry:  Tracings reviewed. Her effort: It was hard to get consistent efforts and there is a question as to whether this reflects a maximal maneuver. FVC: 1.83L, 65% FEV1: 1.38L, 63% predicted FEV1/FVC ratio: 75% Interpretation: Spirometry consistent with possible restrictive disease.  Please see scanned spirometry results for details.   Assessment:   1. Severe persistent asthma without complication   2. LPRD (laryngopharyngeal reflux disease)   3. Age-related osteoporosis with current pathological fracture with routine healing, subsequent encounter     Plan/Recommendations:  Severe Persistent Asthma LPR  - Spirometry today with not the best effort, possible restriction but no clear obstruction. Discussed symptoms could be related to worsening asthma control or possibly LPR.  - Maintenance inhaler: For the next 1-2 weeks, increase Breztri to 2 puffs twice daily.  Then go back to 2 puffs daily.  - If not improvement with increased Breztri, start Nexium or Protonix 40mg  daily for 4 weeks and then stop.  Take this on an empty stomach and then about 30-45 minutes later, eat a meal or a snack. - In setting of osteoporosis, will try to avoid oral prednisone and instead increased inhaled steroid.  Will also only do a short course of PPI.  - Rescue inhaler: Albuterol 2 puffs via spacer or 1 vial via nebulizer every 4-6 hours as needed for respiratory symptoms of cough, shortness of  breath, or wheezing Asthma control goals:  Full participation in all desired activities (may need albuterol before activity) Albuterol use two times or less a week on average (not counting use with activity) Cough interfering with sleep two times or less a month Oral steroids no more than once a year No hospitalizations    Keep follow up with Dr. [name].   Alesia Morin, MD [facility]

## 2023-01-07 NOTE — Patient Instructions (Addendum)
Severe Persistent Asthma - Maintenance inhaler: For the next 1-2 weeks, increase Breztri to 2 puffs twice daily with spacer.  Then go back to 2 puffs daily.  - If not improvement with increased Breztri, start Nexium 40mg  daily for 4 weeks and then stop.  Take this on an empty stomach and then about 30-45 minutes later, eat a meal or a snack. - Rescue inhaler: Albuterol 2 puffs via spacer or 1 vial via nebulizer every 4-6 hours as needed for respiratory symptoms of cough, shortness of breath, or wheezing Asthma control goals:  Full participation in all desired activities (may need albuterol before activity) Albuterol use two times or less a week on average (not counting use with activity) Cough interfering with sleep two times or less a month Oral steroids no more than once a year No hospitalizations

## 2023-01-07 NOTE — Telephone Encounter (Signed)
*  Asthma/Allergy  Pharmacy Patient Advocate Encounter   Received notification from CoverMyMeds that prior authorization for Pantoprazole Sodium 40MG  dr tablets  is required/requested.   Insurance verification completed.   The patient is insured through [facility] .   Per test claim: PA required; PA submitted to [facility] via CoverMyMeds Key/confirmation #/EOC [id number] Status is pending

## 2023-01-13 DIAGNOSIS — R262 Difficulty in walking, not elsewhere classified: Secondary | ICD-10-CM | POA: Diagnosis not present

## 2023-01-13 DIAGNOSIS — R29898 Other symptoms and signs involving the musculoskeletal system: Secondary | ICD-10-CM | POA: Diagnosis not present

## 2023-01-13 DIAGNOSIS — S92352S Displaced fracture of fifth metatarsal bone, left foot, sequela: Secondary | ICD-10-CM | POA: Diagnosis not present

## 2023-01-17 ENCOUNTER — Ambulatory Visit (INDEPENDENT_AMBULATORY_CARE_PROVIDER_SITE_OTHER): Payer: BC Managed Care – PPO

## 2023-01-17 DIAGNOSIS — J309 Allergic rhinitis, unspecified: Secondary | ICD-10-CM | POA: Diagnosis not present

## 2023-01-18 ENCOUNTER — Other Ambulatory Visit (HOSPITAL_COMMUNITY): Payer: Self-pay

## 2023-01-18 MED ORDER — PANTOPRAZOLE SODIUM 40 MG PO TBEC: DELAYED_RELEASE_TABLET | ORAL | 0 refills | Status: DC

## 2023-01-18 NOTE — Telephone Encounter (Signed)
Pharmacy Patient Advocate Encounter  Received notification from [facility] that Prior Authorization for Pantoprazole Sodium 40MG  dr tablets has been APPROVED from 01-17-2023 to 01-17-2024. Ran test claim, Copay is $2.26  PA #/Case ID/Reference #: [id number]

## 2023-01-18 NOTE — Telephone Encounter (Signed)
Called patient - DOB/Pharmacy verified - advised of notation below.   Patient verbalized  understanding to all, no further questions.  Pantoprazole (Protonix) 40 mg prescription sent to [facility].

## 2023-01-28 ENCOUNTER — Other Ambulatory Visit (HOSPITAL_COMMUNITY): Payer: Self-pay

## 2023-02-01 ENCOUNTER — Other Ambulatory Visit: Payer: Self-pay | Admitting: Obstetrics and Gynecology

## 2023-02-01 DIAGNOSIS — Z1231 Encounter for screening mammogram for malignant neoplasm of breast: Secondary | ICD-10-CM

## 2023-02-07 ENCOUNTER — Ambulatory Visit (INDEPENDENT_AMBULATORY_CARE_PROVIDER_SITE_OTHER): Payer: BC Managed Care – PPO

## 2023-02-07 DIAGNOSIS — J309 Allergic rhinitis, unspecified: Secondary | ICD-10-CM | POA: Diagnosis not present

## 2023-02-09 ENCOUNTER — Other Ambulatory Visit: Payer: Self-pay | Admitting: Internal Medicine

## 2023-02-18 ENCOUNTER — Ambulatory Visit
Admission: RE | Admit: 2023-02-18 | Discharge: 2023-02-18 | Disposition: A | Discharge status: Home / Self Care | Payer: BC Managed Care – PPO | Source: Ambulatory Visit | Attending: Obstetrics and Gynecology | Admitting: Obstetrics and Gynecology

## 2023-02-18 DIAGNOSIS — Z1231 Encounter for screening mammogram for malignant neoplasm of breast: Secondary | ICD-10-CM | POA: Diagnosis not present

## 2023-02-21 ENCOUNTER — Other Ambulatory Visit (HOSPITAL_COMMUNITY): Payer: Self-pay

## 2023-02-23 DIAGNOSIS — M21372 Foot drop, left foot: Secondary | ICD-10-CM | POA: Diagnosis not present

## 2023-02-23 DIAGNOSIS — S92355D Nondisplaced fracture of fifth metatarsal bone, left foot, subsequent encounter for fracture with routine healing: Secondary | ICD-10-CM | POA: Diagnosis not present

## 2023-03-01 ENCOUNTER — Ambulatory Visit: Payer: BC Managed Care – PPO | Admitting: Allergy and Immunology

## 2023-03-01 ENCOUNTER — Ambulatory Visit: Payer: Self-pay

## 2023-03-01 ENCOUNTER — Encounter: Payer: Self-pay | Admitting: Allergy and Immunology

## 2023-03-01 ENCOUNTER — Other Ambulatory Visit: Payer: Self-pay

## 2023-03-01 ENCOUNTER — Telehealth: Payer: Self-pay

## 2023-03-01 VITALS — BP 136/84 | HR 101 | Temp 97.6°F | Resp 16 | Ht 62.0 in | Wt 141.0 lb

## 2023-03-01 DIAGNOSIS — J040 Acute laryngitis: Secondary | ICD-10-CM | POA: Diagnosis not present

## 2023-03-01 DIAGNOSIS — K219 Gastro-esophageal reflux disease without esophagitis: Secondary | ICD-10-CM | POA: Diagnosis not present

## 2023-03-01 DIAGNOSIS — J4550 Severe persistent asthma, uncomplicated: Secondary | ICD-10-CM | POA: Diagnosis not present

## 2023-03-01 DIAGNOSIS — J309 Allergic rhinitis, unspecified: Secondary | ICD-10-CM | POA: Diagnosis not present

## 2023-03-01 MED ORDER — SPACER/AERO-HOLDING CHAMBERS DEVI: 1.0000 | 1 refills | Status: DC

## 2023-03-01 MED ORDER — BREZTRI AEROSPHERE 160-9-4.8 MCG/ACT IN AERO: 2.0000 | INHALATION_SPRAY | Freq: Two times a day (BID) | RESPIRATORY_TRACT | 1 refills | Status: DC

## 2023-03-01 MED ORDER — PANTOPRAZOLE SODIUM 40 MG PO TBEC: 40.0000 mg | DELAYED_RELEASE_TABLET | Freq: Two times a day (BID) | ORAL | 1 refills | Status: DC

## 2023-03-01 MED ORDER — AIRSUPRA 90-80 MCG/ACT IN AERO: 2.0000 | INHALATION_SPRAY | RESPIRATORY_TRACT | 1 refills | Status: DC | PRN

## 2023-03-01 MED ORDER — FLUCONAZOLE 150 MG PO TABS: 150.0000 mg | ORAL_TABLET | ORAL | 0 refills | Status: DC

## 2023-03-01 MED ORDER — METHYLPREDNISOLONE ACETATE 80 MG/ML IJ SUSP
80.0000 mg | Freq: Once | INTRAMUSCULAR | Status: AC
  Administered 2023-03-01: 80 mg via INTRAMUSCULAR

## 2023-03-01 NOTE — Patient Instructions (Addendum)
  1.  Treat inflammation of airway:  A. Depomedrol 80 mg IM delivered in clinic today B. Breztri - 2 inhalations only 1 time per day with spacer  2. Treat reflux induced respiratory tract inflammation:   A. Pantoprazole 40 mg - 1 tablet 2 times per day  3. Treat possible fungal overgrowth:   A. Diflucan 150 - 1 tablet today and this Friday  4.  Continue immunotherapy  5.  Obtain chest X-ray  6.  Obtain ENT evaluation of throat with [facility]  7.  Obtain blood - IgE, CBC w/d  8. If needed: AIRSUPRA - 2 inhalations every 6 hours  9.  Return to clinic in 2 weeks or earlier if needed

## 2023-03-01 NOTE — Telephone Encounter (Signed)
Per Dr. Lucie Leather, please refer patient to a [facility] for evaluation of her throat.

## 2023-03-01 NOTE — Progress Notes (Unsigned)
[place] - [place] - [place] - [place] - [place]   Follow-up Note  Referring Provider: Deeann Saint, MD Primary Provider: Deeann Saint, MD Date of Office Visit: 03/01/2023  Subjective:   Angela [name] (DOB: 06-Feb-1956) is a 67 y.o. female who [facility] on 03/01/2023 in re-evaluation of the following:  HPI: Angela [name] returns to this clinic in evaluation of asthma and reflux.  I last saw her in this clinic 21 September 2022.  She visited with Dr. Allena Katz on 07 January 2023 with a several week history of coughing and wheezing and feeling very significantly congested in her upper chest with lots of mucus.  She was started on a higher dose of Breztri from 1 time per day used to 2 time per day use.  This may have resulted in some improvement regarding her breathing but she developed a very significant problem with coughing and lost voice and then she started on some Nexium and stopped her Markus Daft and now she is left with feeling very congested in her chest, coughing, wheezing, and loss of voice.  During one of our earlier visits we did talk about the treatment of osteoporosis and she did start a biphosphonate this week.  Allergies as of 03/01/2023       Reactions   Codeine Nausea And Vomiting, Diarrhea, Nausea Only   Nsaids Other (See Comments)   BP RISE   Pollen Extract Itching        Medication List    albuterol 108 (90 Base) MCG/ACT inhaler Commonly known as: Ventolin HFA Inhale 2 puffs into the lungs every 4 (four) hours as needed for wheezing or shortness of breath. 2 puffs every 4 hours if needed   Breztri Aerosphere 160-9-4.8 MCG/ACT Aero Generic drug: Budeson-Glycopyrrol-Formoterol Inhale 2 puffs into the lungs in the morning and at bedtime.   estradiol 1.53 MG/SPRAY transdermal spray Commonly known as: EVAMIST Place 2 sprays onto the skin daily.   pantoprazole 40 MG tablet Commonly known as: PROTONIX TAKE 1 (ONE) TABLET BY  MOUTH AN EMPTY STOMACH AND THEN ABOUT 30-45 MINUTES LATER, EAT A MEAL OR A SNACK FOR 4 (FOUR) WEEK THEN STOP.   potassium chloride SA 20 MEQ tablet Commonly known as: KLOR-CON M TAKE 1 TABLET(20 MEQ) BY MOUTH DAILY   progesterone 100 MG capsule Commonly known as: PROMETRIUM Take 100 mg by mouth at bedtime.   Risedronate Sodium 35 MG Tbec Take 35 mg by mouth once a week.   Spacer/Aero-Holding [employer] 1 Device by Does not apply route as directed.   triamterene-hydrochlorothiazide 37.5-25 MG tablet Commonly known as: MAXZIDE-25 Take 1 tablet by mouth daily.   triamterene-hydrochlorothiazide 37.5-25 MG capsule Commonly known as: DYAZIDE TAKE 1 CAPSULE BY MOUTH EVERY DAY   Vitamin D2 10 MCG (400 UNIT) Tabs Take 1 tablet by mouth daily.    Past Medical History:  Diagnosis Date   Asthma    Diverticulosis    Elevated C-reactive protein (CRP)    Esophageal stricture    GERD (gastroesophageal reflux disease)    HH (hiatus hernia)    Hypertension    MVP (mitral valve prolapse)    Osteoporosis    Renal calculus     Past Surgical History:  Procedure Laterality Date   Broken Elbow Right    Broken Finger Right    Ring Finger   FOOT FRACTURE SURGERY Left 06/20/2022    Review of systems negative except as noted in HPI / PMHx  or noted below:  Review of Systems  Constitutional: Negative.   HENT: Negative.    Eyes: Negative.   Respiratory: Negative.    Cardiovascular: Negative.   Gastrointestinal: Negative.   Genitourinary: Negative.   Musculoskeletal: Negative.   Skin: Negative.   Neurological: Negative.   Endo/Heme/Allergies: Negative.   Psychiatric/Behavioral: Negative.       Objective:   Vitals:   03/01/23 1631  BP: 136/84  Pulse: (!) 101  Resp: 16  Temp: 97.6 F (36.4 C)  SpO2: 96%   Height: 5\' 2"  (157.5 cm)  Weight: 141 lb (64 kg)   Physical Exam Constitutional:      Appearance: She is not diaphoretic.  HENT:     Head: Normocephalic.      Right Ear: Tympanic membrane, ear canal and external ear normal.     Left Ear: Tympanic membrane, ear canal and external ear normal.     Nose: Nose normal. No mucosal edema or rhinorrhea.     Mouth/Throat:     Pharynx: Uvula midline. No oropharyngeal exudate.  Eyes:     Conjunctiva/sclera: Conjunctivae normal.  Neck:     Thyroid: No thyromegaly.     Trachea: Trachea normal. No tracheal tenderness or tracheal deviation.  Cardiovascular:     Rate and Rhythm: Normal rate and regular rhythm.     Heart sounds: Normal heart sounds, S1 normal and S2 normal. No murmur heard. Pulmonary:     Effort: No respiratory distress.     Breath sounds: Normal breath sounds. No stridor. No wheezing or rales.  Lymphadenopathy:     Head:     Right side of head: No tonsillar adenopathy.     Left side of head: No tonsillar adenopathy.     Cervical: No cervical adenopathy.  Skin:    Findings: No erythema or rash.     Nails: There is no clubbing.  Neurological:     Mental Status: She is alert.     Diagnostics:    Spirometry was performed and demonstrated an FEV1 of 1.36 at 62 % of predicted.  Assessment and Plan:   1. Not well controlled severe persistent asthma   2. Gastroesophageal reflux disease, unspecified whether esophagitis present    1.  Treat inflammation of airway:  A. Depomedrol 80 mg IM delivered in clinic today B. Breztri - 2 inhalations only 1 time per day with spacer  2. Treat reflux induced respiratory tract inflammation:   A. Pantoprazole 40 mg - 1 tablet 2 times per day  3. Treat possible fungal overgrowth:   A. Diflucan 150 - 1 tablet today and this Friday  4.  Continue immunotherapy  5.  Obtain chest X-ray  6.  Obtain ENT evaluation of throat with [facility]  7.  Obtain blood - IgE, CBC w/d  8. If needed: AIRSUPRA - 2 inhalations every 6 hours  9.  Return to clinic in 2 weeks or earlier if needed  Angela [name] has some issues that have developed since mid July 2024.  I  am going to obtain a chest x-ray, have her throat evaluated by ENT, check some blood test to see if she be a candidate for a biologic agent to address the inflammatory component of her respiratory tract problem, have her consistently treat reflux induced respiratory disease, and I given her systemic steroid today to address inflammation of her airway.  I will see her back in this clinic in 2 weeks or earlier if there is a problem.  Laurette Schimke,  MD Allergy / Immunology [facility]

## 2023-03-02 ENCOUNTER — Encounter: Payer: Self-pay | Admitting: Allergy and Immunology

## 2023-03-02 ENCOUNTER — Ambulatory Visit
Admission: RE | Admit: 2023-03-02 | Discharge: 2023-03-02 | Disposition: A | Discharge status: Home / Self Care | Payer: BC Managed Care – PPO | Source: Ambulatory Visit | Attending: Allergy and Immunology | Admitting: Allergy and Immunology

## 2023-03-02 DIAGNOSIS — J45909 Unspecified asthma, uncomplicated: Secondary | ICD-10-CM | POA: Diagnosis not present

## 2023-03-02 NOTE — Telephone Encounter (Signed)
Spoke with Mrs. Angela [name] on the phone and she is going to keep the appt for 03/04/23 with [facility].

## 2023-03-02 NOTE — Telephone Encounter (Signed)
Per Dr. Lucie Leather, Please ask [facility] if they will see Angela [name] this week as I am worried that her laryngitis is either a fungal laryngitis or possibly something more significant. Patient received Dee's MyChart message and is calling to get scheduled. She stated if she is not able to get in this week she will call us and let us know.

## 2023-03-03 ENCOUNTER — Other Ambulatory Visit: Payer: Self-pay

## 2023-03-03 DIAGNOSIS — R262 Difficulty in walking, not elsewhere classified: Secondary | ICD-10-CM | POA: Diagnosis not present

## 2023-03-03 DIAGNOSIS — R29898 Other symptoms and signs involving the musculoskeletal system: Secondary | ICD-10-CM | POA: Diagnosis not present

## 2023-03-03 DIAGNOSIS — S92352S Displaced fracture of fifth metatarsal bone, left foot, sequela: Secondary | ICD-10-CM | POA: Diagnosis not present

## 2023-03-03 LAB — CBC WITH DIFFERENTIAL/PLATELET
Basophils Absolute: 0 10*3/uL (ref 0.0–0.2)
Basos: 1 %
EOS (ABSOLUTE): 0.4 10*3/uL (ref 0.0–0.4)
Eos: 6 %
Hematocrit: 42.9 % (ref 34.0–46.6)
Hemoglobin: 13.2 g/dL (ref 11.1–15.9)
Immature Grans (Abs): 0 10*3/uL (ref 0.0–0.1)
Immature Granulocytes: 0 %
Lymphocytes Absolute: 2.6 10*3/uL (ref 0.7–3.1)
Lymphs: 35 %
MCH: 25.9 pg — ABNORMAL LOW (ref 26.6–33.0)
MCHC: 30.8 g/dL — ABNORMAL LOW (ref 31.5–35.7)
MCV: 84 fL (ref 79–97)
Monocytes Absolute: 0.6 10*3/uL (ref 0.1–0.9)
Monocytes: 8 %
Neutrophils Absolute: 3.6 10*3/uL (ref 1.4–7.0)
Neutrophils: 50 %
Platelets: 308 10*3/uL (ref 150–450)
RBC: 5.09 x10E6/uL (ref 3.77–5.28)
RDW: 15.4 % (ref 11.7–15.4)
WBC: 7.2 10*3/uL (ref 3.4–10.8)

## 2023-03-03 LAB — IGE: IgE (Immunoglobulin E), Serum: 1521 IU/mL — ABNORMAL HIGH (ref 6–495)

## 2023-03-03 MED ORDER — AIRSUPRA 90-80 MCG/ACT IN AERO: 2.0000 | INHALATION_SPRAY | RESPIRATORY_TRACT | 1 refills | Status: DC | PRN

## 2023-03-03 MED ORDER — PANTOPRAZOLE SODIUM 40 MG PO TBEC: 40.0000 mg | DELAYED_RELEASE_TABLET | Freq: Two times a day (BID) | ORAL | 1 refills | Status: DC

## 2023-03-03 MED ORDER — BREZTRI AEROSPHERE 160-9-4.8 MCG/ACT IN AERO: 2.0000 | INHALATION_SPRAY | Freq: Two times a day (BID) | RESPIRATORY_TRACT | 1 refills | Status: DC

## 2023-03-03 MED ORDER — FLUCONAZOLE 150 MG PO TABS: 150.0000 mg | ORAL_TABLET | ORAL | 0 refills | Status: AC

## 2023-03-03 MED ORDER — SPACER/AERO-HOLDING CHAMBERS DEVI: 1.0000 | 1 refills | Status: DC

## 2023-03-03 NOTE — Telephone Encounter (Signed)
Please see patients mychart message regarding the pharmacy change.  Thanks

## 2023-03-04 DIAGNOSIS — R490 Dysphonia: Secondary | ICD-10-CM | POA: Diagnosis not present

## 2023-03-04 DIAGNOSIS — J45909 Unspecified asthma, uncomplicated: Secondary | ICD-10-CM | POA: Diagnosis not present

## 2023-03-04 DIAGNOSIS — K219 Gastro-esophageal reflux disease without esophagitis: Secondary | ICD-10-CM | POA: Diagnosis not present

## 2023-03-05 ENCOUNTER — Other Ambulatory Visit: Payer: Self-pay | Admitting: Allergy and Immunology

## 2023-03-07 ENCOUNTER — Other Ambulatory Visit: Payer: Self-pay

## 2023-03-07 MED ORDER — FLUCONAZOLE 150 MG PO TABS: 150.0000 mg | ORAL_TABLET | Freq: Every day | ORAL | 0 refills | Status: DC

## 2023-03-08 DIAGNOSIS — M21372 Foot drop, left foot: Secondary | ICD-10-CM | POA: Diagnosis not present

## 2023-03-15 ENCOUNTER — Other Ambulatory Visit: Payer: Self-pay

## 2023-03-15 ENCOUNTER — Ambulatory Visit: Payer: BC Managed Care – PPO | Admitting: Allergy and Immunology

## 2023-03-15 ENCOUNTER — Telehealth: Payer: Self-pay | Admitting: *Deleted

## 2023-03-15 ENCOUNTER — Encounter: Payer: Self-pay | Admitting: Allergy and Immunology

## 2023-03-15 VITALS — BP 138/84 | HR 96 | Temp 98.2°F | Resp 18 | Ht 62.0 in

## 2023-03-15 DIAGNOSIS — J4550 Severe persistent asthma, uncomplicated: Secondary | ICD-10-CM

## 2023-03-15 DIAGNOSIS — D7219 Other eosinophilia: Secondary | ICD-10-CM

## 2023-03-15 DIAGNOSIS — K219 Gastro-esophageal reflux disease without esophagitis: Secondary | ICD-10-CM

## 2023-03-15 MED ORDER — TEZSPIRE 210 MG/1.91ML ~~LOC~~ SOAJ
210.0000 mg | SUBCUTANEOUS | 11 refills | Status: AC
  Filled 2023-03-17: qty 1.91, 28d supply, fill #0
  Filled 2023-04-15: qty 1.91, 28d supply, fill #1
  Filled 2023-05-16: qty 1.91, 28d supply, fill #2

## 2023-03-15 NOTE — Progress Notes (Unsigned)
[place] - [place] - [place] - [place] -    Follow-up Note  Referring Provider: Deeann Saint, MD Primary Provider: Deeann Saint, MD Date of Office Visit: 03/15/2023  Subjective:   Angela [name] [facility] (DOB: 1955-09-15) is a 67 y.o. female who [facility] on 03/15/2023 in re-evaluation of the following:  HPI: Angela [name] returns to this clinic in evaluation of severe asthma and reflux.  I last saw her in this clinic 01 March 2023.  She has improved significantly since her last visit.  With the administration of a systemic steroid, Diflucan, aggressive therapy directed against reflux, she has resolved a fair amount of the congestion she had in her chest and she is only used her short acting bronchodilator at nighttime twice in the past 2 weeks.  Her voice is also improving.  Allergies as of 03/15/2023       Reactions   Codeine Nausea And Vomiting, Diarrhea, Nausea Only   Nsaids Other (See Comments)   BP RISE   Pollen Extract Itching        Medication List    Airsupra 90-80 MCG/ACT Aero Generic drug: Albuterol-Budesonide Inhale 2 Inhalations into the lungs every 4 (four) hours as needed.   albuterol 108 (90 Base) MCG/ACT inhaler Commonly known as: Ventolin HFA Inhale 2 puffs into the lungs every 4 (four) hours as needed for wheezing or shortness of breath. 2 puffs every 4 hours if needed   Breztri Aerosphere 160-9-4.8 MCG/ACT Aero Generic drug: Budeson-Glycopyrrol-Formoterol Inhale 2 puffs into the lungs in the morning and at bedtime.   estradiol 1.53 MG/SPRAY transdermal spray Commonly known as: EVAMIST Place 2 sprays onto the skin daily.   pantoprazole 40 MG tablet Commonly known as: PROTONIX Take 1 tablet (40 mg total) by mouth 2 (two) times daily.   potassium chloride SA 20 MEQ tablet Commonly known as: KLOR-CON M TAKE 1 TABLET(20 MEQ) BY MOUTH DAILY   progesterone 100 MG capsule Commonly known as:  PROMETRIUM Take 100 mg by mouth at bedtime.   Risedronate Sodium 35 MG Tbec Take 35 mg by mouth once a week.   Spacer/Aero-Holding [employer] 1 Device by Does not apply route as directed.   triamterene-hydrochlorothiazide 37.5-25 MG tablet Commonly known as: MAXZIDE-25 Take 1 tablet by mouth daily.   triamterene-hydrochlorothiazide 37.5-25 MG capsule Commonly known as: DYAZIDE TAKE 1 CAPSULE BY MOUTH EVERY DAY   Vitamin D2 10 MCG (400 UNIT) Tabs Take 1 tablet by mouth daily.    Past Medical History:  Diagnosis Date   Asthma    Diverticulosis    Elevated C-reactive protein (CRP)    Esophageal stricture    GERD (gastroesophageal reflux disease)    HH (hiatus hernia)    Hypertension    MVP (mitral valve prolapse)    Osteoporosis    Renal calculus     Past Surgical History:  Procedure Laterality Date   Broken Elbow Right    Broken Finger Right    Ring Finger   FOOT FRACTURE SURGERY Left 06/20/2022    Review of systems negative except as noted in HPI / PMHx or noted below:  Review of Systems  Constitutional: Negative.   HENT: Negative.    Eyes: Negative.   Respiratory: Negative.    Cardiovascular: Negative.   Gastrointestinal: Negative.   Genitourinary: Negative.   Musculoskeletal: Negative.   Skin: Negative.   Neurological: Negative.   Endo/Heme/Allergies: Negative.   Psychiatric/Behavioral: Negative.  Objective:   Vitals:   03/15/23 0828  BP: 138/84  Pulse: 96  Resp: 18  Temp: 98.2 F (36.8 C)  SpO2: 96%   Height: 5\' 2"  (157.5 cm)      Physical Exam Constitutional:      Appearance: She is not diaphoretic.  HENT:     Head: Normocephalic.     Right Ear: Tympanic membrane, ear canal and external ear normal.     Left Ear: Tympanic membrane, ear canal and external ear normal.     Nose: Nose normal. No mucosal edema or rhinorrhea.     Mouth/Throat:     Pharynx: Uvula midline. No oropharyngeal exudate.  Eyes:     Conjunctiva/sclera:  Conjunctivae normal.  Neck:     Thyroid: No thyromegaly.     Trachea: Trachea normal. No tracheal tenderness or tracheal deviation.  Cardiovascular:     Rate and Rhythm: Normal rate and regular rhythm.     Heart sounds: Normal heart sounds, S1 normal and S2 normal. No murmur heard. Pulmonary:     Effort: No respiratory distress.     Breath sounds: Normal breath sounds. No stridor. No wheezing or rales.  Lymphadenopathy:     Head:     Right side of head: No tonsillar adenopathy.     Left side of head: No tonsillar adenopathy.     Cervical: No cervical adenopathy.  Skin:    Findings: No erythema or rash.     Nails: There is no clubbing.  Neurological:     Mental Status: She is alert.     Diagnostics:    Spirometry was performed and demonstrated an FEV1 of 1.38 at 63 % of predicted.  Results of blood tests obtained 01 March 2023 identified WBC 7.2, absolute eosinophil 400, absolute lymphocyte 2600, hemoglobin 13.2, platelet 308, IgE 1521 KU/L.  Results of a rhinoscopy performed 04 March 2023 identified the following:  Nasopharynx: The eustachian tube orifice and fossa of Rossenmuller are normal. There are no masses or lesions.  Oropharynx & Hypopharynx: The base of tongue, vallecula, pyriform sinuses, and post-cricoid area are normal. There are no masses or lesions. There is no pooling of secretions.  Larynx: The a-e folds, arytenoids, false cords are normal. The vocal cords were mobile bilaterally with abduction and good adduction. There are no masses or lesions. The patient has good sensation with a strong gag reflex.  Subglottis: Visualizing the subglottis in an awake patient during an outpatient exam is less reliable than for a sedated patient in the OR setting. However, the subglottis below the vocal cords and the trachea were visualized and appeared normal with no evidence of stenosis.  Results of a chest x-ray obtained 02 March 2023 identified no significant  abnormality with evidence of hyperexpanded lungs.  Assessment and Plan:   1. Not well controlled severe persistent asthma   2. LPRD (laryngopharyngeal reflux disease)   3. Other eosinophilia     1.  Treat inflammation of airway:  A. Breztri - 2 inhalations 1-2 times per day with spacer  2. Treat reflux induced respiratory tract inflammation:   A. Pantoprazole 40 mg - 1 tablet 2 times per day  3.  Continue immunotherapy  4.  Start Tezepelumab injections every 4 weeks  5.  If Needed: AIRSUPRA - 2 inhalations every 6 hours  6.  Plan for fall flu vaccine  7.  Return to clinic in 8 weeks or earlier if needed. Taper medications???  Although Angela [name] is better symptomatically there is  certainly some evidence of significant inflammation and immune activation and I think in the long run it would be a very good thing to start her on an anti-TSL P antibody for at least a year.  If she has a very good response to this antibody I think we will be able to consolidate her medical treatment and have her decrease her inhaled anti-inflammatory medicines which may have been responsible for some of her laryngitis and also decrease her therapy for reflux.  I will see her back in this clinic in 8 weeks or earlier if there is a problem.  Laurette Schimke, MD Allergy / Immunology [facility]

## 2023-03-15 NOTE — Patient Instructions (Addendum)
  1.  Treat inflammation of airway:  A. Breztri - 2 inhalations 1-2 times per day with spacer  2. Treat reflux induced respiratory tract inflammation:   A. Pantoprazole 40 mg - 1 tablet 2 times per day  3.  Continue immunotherapy  4.  Start Tezepelumab injections every 4 weeks  5.  If Needed: AIRSUPRA - 2 inhalations every 6 hours  6.  Plan for fall flu vaccine  7.  Return to clinic in 8 weeks or earlier if needed. Taper medications???

## 2023-03-15 NOTE — Telephone Encounter (Signed)
-----   Message from ERIC J KOZLOW sent at 03/07/2023  7:29 AM EDT ----- Please inform Angela [name] that her blood test have returned and she does have eosinophilia which she has had in the past and she does have an IgE which she has had in the past and lets start her on a biologic agent using tezepelumab with sample and then every 4 weeks.

## 2023-03-15 NOTE — Telephone Encounter (Signed)
Called patient and advised approval, copay card and submit to [facility]. In case they can't process will let her know if I have to send to [facility]. Patient wants to get injs in clinic so I will let her know when delivery set to make appt to start therapy

## 2023-03-16 ENCOUNTER — Encounter: Payer: Self-pay | Admitting: Allergy and Immunology

## 2023-03-16 ENCOUNTER — Other Ambulatory Visit (HOSPITAL_COMMUNITY): Payer: Self-pay

## 2023-03-16 ENCOUNTER — Other Ambulatory Visit: Payer: Self-pay

## 2023-03-17 ENCOUNTER — Other Ambulatory Visit (HOSPITAL_COMMUNITY): Payer: Self-pay

## 2023-03-17 ENCOUNTER — Other Ambulatory Visit: Payer: Self-pay

## 2023-03-17 NOTE — Progress Notes (Signed)
Specialty Pharmacy Initiation Note   Angela [name] is a 67 y.o. female who will be followed by the specialty pharmacy service for RxSp Asthma/COPD    Review of administration, indication, effectiveness, safety, potential side effects, storage/disposable, and missed dose instructions occurred today for patient's specialty medication(s) [facility]     Patient did not have any additional questions or concerns.   Patient's therapy is appropriate to: Initiate    Goals Addressed             This Visit's Progress    Reduce signs and symptoms       Patient is initiating therapy. Patient will maintain adherence

## 2023-03-17 NOTE — Progress Notes (Signed)
Specialty Pharmacy Initial Fill Coordination Note  Angela [name] is a 67 y.o. female contacted today regarding refills of specialty medication(s) [facility]   Patient requested Courier to Provider Office   Delivery date: 03/22/23   Verified address: [address] [place] [address]   Medication will be filled on 10/04.   Patient is aware of $0.00 copayment.

## 2023-03-18 ENCOUNTER — Other Ambulatory Visit: Payer: Self-pay

## 2023-03-29 ENCOUNTER — Other Ambulatory Visit: Payer: Self-pay

## 2023-03-31 ENCOUNTER — Ambulatory Visit: Payer: BC Managed Care – PPO

## 2023-03-31 DIAGNOSIS — J4550 Severe persistent asthma, uncomplicated: Secondary | ICD-10-CM | POA: Diagnosis not present

## 2023-03-31 MED ORDER — TEZEPELUMAB-EKKO 210 MG/1.91ML ~~LOC~~ SOSY
210.0000 mg | PREFILLED_SYRINGE | SUBCUTANEOUS | Status: AC
  Administered 2023-03-31 – 2023-12-14 (×9): 210 mg via SUBCUTANEOUS

## 2023-03-31 NOTE — Progress Notes (Signed)
Immunotherapy   Patient Details  Name: Angela [name] MRN: [id number] Date of Birth: [birth year]  03/31/2023  Angela [name] started injections for  Tezspire  Frequency: Every 28 days Consent signed and patient  Instructions given. Epi-Pen Available : Yes Patient started Tezspire today and received 1.64mL in the RUA. Patient waited 30 minutes in office and did not experience any issues.    [name] 03/31/2023, 9:27 AM

## 2023-04-05 ENCOUNTER — Ambulatory Visit (INDEPENDENT_AMBULATORY_CARE_PROVIDER_SITE_OTHER): Payer: BC Managed Care – PPO | Admitting: *Deleted

## 2023-04-05 DIAGNOSIS — J309 Allergic rhinitis, unspecified: Secondary | ICD-10-CM

## 2023-04-14 DIAGNOSIS — Z9181 History of falling: Secondary | ICD-10-CM | POA: Diagnosis not present

## 2023-04-14 DIAGNOSIS — M21372 Foot drop, left foot: Secondary | ICD-10-CM | POA: Diagnosis not present

## 2023-04-14 DIAGNOSIS — M25562 Pain in left knee: Secondary | ICD-10-CM | POA: Diagnosis not present

## 2023-04-15 ENCOUNTER — Other Ambulatory Visit (HOSPITAL_COMMUNITY): Payer: Self-pay | Admitting: Pharmacy Technician

## 2023-04-15 ENCOUNTER — Other Ambulatory Visit (HOSPITAL_COMMUNITY): Payer: Self-pay

## 2023-04-15 NOTE — Progress Notes (Signed)
Specialty Pharmacy Refill Coordination Note  Angela [name] is a 67 y.o. female contacted today regarding refills of specialty medication(s) Tezepelumab-Ekko   Patient requested Courier to Provider Office   Delivery date: 04/25/23   Verified address: Asthma/Allergy  [address]   Medication will be filled on 04/22/23.

## 2023-04-21 ENCOUNTER — Institutional Professional Consult (permissible substitution) (INDEPENDENT_AMBULATORY_CARE_PROVIDER_SITE_OTHER): Payer: BC Managed Care – PPO | Admitting: Otolaryngology

## 2023-04-22 DIAGNOSIS — M5459 Other low back pain: Secondary | ICD-10-CM | POA: Diagnosis not present

## 2023-04-22 DIAGNOSIS — M21372 Foot drop, left foot: Secondary | ICD-10-CM | POA: Diagnosis not present

## 2023-04-28 ENCOUNTER — Ambulatory Visit: Payer: BC Managed Care – PPO

## 2023-04-28 DIAGNOSIS — J4550 Severe persistent asthma, uncomplicated: Secondary | ICD-10-CM

## 2023-04-28 DIAGNOSIS — R292 Abnormal reflex: Secondary | ICD-10-CM | POA: Diagnosis not present

## 2023-04-28 DIAGNOSIS — M21371 Foot drop, right foot: Secondary | ICD-10-CM | POA: Diagnosis not present

## 2023-04-28 DIAGNOSIS — M21372 Foot drop, left foot: Secondary | ICD-10-CM | POA: Diagnosis not present

## 2023-04-28 DIAGNOSIS — M4316 Spondylolisthesis, lumbar region: Secondary | ICD-10-CM | POA: Diagnosis not present

## 2023-04-29 ENCOUNTER — Other Ambulatory Visit: Payer: Self-pay | Admitting: Neurological Surgery

## 2023-04-29 ENCOUNTER — Encounter: Payer: Self-pay | Admitting: Neurological Surgery

## 2023-04-29 DIAGNOSIS — R292 Abnormal reflex: Secondary | ICD-10-CM

## 2023-04-29 DIAGNOSIS — M4316 Spondylolisthesis, lumbar region: Secondary | ICD-10-CM

## 2023-05-03 ENCOUNTER — Ambulatory Visit: Payer: BC Managed Care – PPO | Admitting: Allergy and Immunology

## 2023-05-03 VITALS — BP 136/88 | HR 84 | Temp 98.2°F | Resp 14 | Ht 61.81 in | Wt 136.4 lb

## 2023-05-03 DIAGNOSIS — G5732 Lesion of lateral popliteal nerve, left lower limb: Secondary | ICD-10-CM | POA: Diagnosis not present

## 2023-05-03 DIAGNOSIS — J3089 Other allergic rhinitis: Secondary | ICD-10-CM

## 2023-05-03 DIAGNOSIS — G5731 Lesion of lateral popliteal nerve, right lower limb: Secondary | ICD-10-CM | POA: Diagnosis not present

## 2023-05-03 DIAGNOSIS — J309 Allergic rhinitis, unspecified: Secondary | ICD-10-CM

## 2023-05-03 DIAGNOSIS — K219 Gastro-esophageal reflux disease without esophagitis: Secondary | ICD-10-CM

## 2023-05-03 DIAGNOSIS — G5733 Lesion of lateral popliteal nerve, bilateral lower limbs: Secondary | ICD-10-CM | POA: Diagnosis not present

## 2023-05-03 DIAGNOSIS — J4550 Severe persistent asthma, uncomplicated: Secondary | ICD-10-CM

## 2023-05-03 MED ORDER — BREZTRI AEROSPHERE 160-9-4.8 MCG/ACT IN AERO: 2.0000 | INHALATION_SPRAY | Freq: Two times a day (BID) | RESPIRATORY_TRACT | 1 refills | Status: DC

## 2023-05-03 MED ORDER — PANTOPRAZOLE SODIUM 40 MG PO TBEC: 40.0000 mg | DELAYED_RELEASE_TABLET | Freq: Every morning | ORAL | 1 refills | Status: DC

## 2023-05-03 MED ORDER — AIRSUPRA 90-80 MCG/ACT IN AERO: 2.0000 | INHALATION_SPRAY | RESPIRATORY_TRACT | 1 refills | Status: DC | PRN

## 2023-05-03 MED ORDER — SPACER/AERO-HOLDING CHAMBERS DEVI: 1.0000 | 1 refills | Status: DC

## 2023-05-03 NOTE — Patient Instructions (Addendum)
  1.  Treat inflammation of airway:  A. Breztri - 2 inhalations 3-7 times per week with spacer B. Tezepelumab injections every 4 weeks C. Immunotherapy  2. Treat reflux induced respiratory tract inflammation:   A. Pantoprazole 40 mg - 1 tablet 3-7 times per week  3.  If Needed: AIRSUPRA - 2 inhalations every 6 hours  4.  Return to clinic in 6 months or earlier if needed.

## 2023-05-03 NOTE — Progress Notes (Unsigned)
Angela [name] - [place] - [place]   Follow-up Note  Referring Provider: Deeann Saint, MD Primary Provider: Deeann Saint, MD Date of Office Visit: 05/03/2023  Subjective:   Angela [name] [facility] (DOB: [birth year]) is a 67 y.o. female who [facility] on [facility]/2023 in re-evaluation of the following:  HPI: Angela [name] returns to this clinic in evaluation of severe eosinophilic asthma, allergic rhinitis, and reflux.  I last saw her in this clinic 15 March 2023.  From a respiratory standpoint she is really doing very well and has very little upper or lower respiratory tract symptoms and does not use the short acting bronchodilator and is able to exercise without any difficulty.  She believes that the use of tezepelumab has resulted in her lungs working much better and she does not feel as restricted and she feels as though her lungs are expanded.  She has not had any issues with her voice and she has not had any reflux issues.  She has had 2 injections of tezepelumab, continues on immunotherapy, continues on a triple inhaler, continues on proton pump inhibitor.  She is working through a foot drop issue which sounds as though it may be a peroneal nerve problem and that has really altered her ability to exercise as she has in the past.  She can still use an elliptical trainer with no problem but can no longer use the treadmill.  Allergies as of 05/03/2023       Reactions   Codeine Nausea And Vomiting, Diarrhea, Nausea Only   Nsaids Other (See Comments)   BP RISE   Pollen Extract Itching        Medication List    Airsupra 90-80 MCG/ACT Aero Generic drug: Albuterol-Budesonide Inhale 2 Inhalations into the lungs every 4 (four) hours as needed.   Breztri Aerosphere 160-9-4.8 MCG/ACT Aero Generic drug: Budeson-Glycopyrrol-Formoterol Inhale 2 puffs into the lungs in the morning and at bedtime.   estradiol 1.53 MG/SPRAY transdermal  spray Commonly known as: EVAMIST Place 2 sprays onto the skin daily.   pantoprazole 40 MG tablet Commonly known as: PROTONIX Take 1 tablet (40 mg total) by mouth 2 (two) times daily.   potassium chloride SA 20 MEQ tablet Commonly known as: KLOR-CON M TAKE 1 TABLET(20 MEQ) BY MOUTH DAILY   progesterone 100 MG capsule Commonly known as: PROMETRIUM Take 100 mg by mouth at bedtime.   Risedronate Sodium 35 MG Tbec Take 35 mg by mouth once a week.   Spacer/Aero-Holding [employer] 1 Device by Does not apply route as directed.   Tezspire 210 MG/1. Soaj Generic drug: Tezepelumab-ekko Inject 210 mg into the skin every 28 (twenty-eight) days.   triamterene-hydrochlorothiazide 37.5-25 MG capsule Commonly known as: DYAZIDE TAKE 1 CAPSULE BY MOUTH EVERY DAY   Vitamin D2 10 MCG (400 UNIT) Tabs Take 1 tablet by mouth daily.    Past Medical History:  Diagnosis Date   Asthma    Diverticulosis    Elevated C-reactive protein (CRP)    Esophageal stricture    GERD (gastroesophageal reflux disease)    HH (hiatus hernia)    Hypertension    MVP (mitral valve prolapse)    Osteoporosis    Renal calculus     Past Surgical History:  Procedure Laterality Date   Broken Elbow Right    Broken Finger Right    Ring Finger   FOOT FRACTURE SURGERY Left 06/20/2022    Review of systems negative  except as noted in HPI / PMHx or noted below:  Review of Systems  Constitutional: Negative.   HENT: Negative.    Eyes: Negative.   Respiratory: Negative.    Cardiovascular: Negative.   Gastrointestinal: Negative.   Genitourinary: Negative.   Musculoskeletal: Negative.   Skin: Negative.   Neurological: Negative.   Endo/Heme/Allergies: Negative.   Psychiatric/Behavioral: Negative.       Objective:   Vitals:   05/03/23 0835  BP: 136/88  Pulse: 84  Resp: 14  Temp: 98.2 F (36.8 C)  SpO2: 97%   Height: 5' 1.81" (157 cm)  Weight: 136 lb 6.4 oz (61.9 kg)   Physical  Exam Constitutional:      Appearance: She is not diaphoretic.  HENT:     Head: Normocephalic.     Right Ear: Tympanic membrane, ear canal and external ear normal.     Left Ear: Tympanic membrane, ear canal and external ear normal.     Nose: Nose normal. No mucosal edema or rhinorrhea.     Mouth/Throat:     Pharynx: Uvula midline. No oropharyngeal exudate.  Eyes:     Conjunctiva/sclera: Conjunctivae normal.  Neck:     Thyroid: No thyromegaly.     Trachea: Trachea normal. No tracheal tenderness or tracheal deviation.  Cardiovascular:     Rate and Rhythm: Normal rate and regular rhythm.     Heart sounds: Normal heart sounds, S1 normal and S2 normal. No murmur heard. Pulmonary:     Effort: No respiratory distress.     Breath sounds: Normal breath sounds. No stridor. No wheezing or rales.  Lymphadenopathy:     Head:     Right side of head: No tonsillar adenopathy.     Left side of head: No tonsillar adenopathy.     Cervical: No cervical adenopathy.  Skin:    Findings: No erythema or rash.     Nails: There is no clubbing.  Neurological:     Mental Status: She is alert.     Diagnostics: Spirometry was performed and demonstrated an FEV1 of 1.52 at 73 % of predicted.  Assessment and Plan:   1. Asthma, severe persistent, well-controlled   2. Other allergic rhinitis   3. LPRD (laryngopharyngeal reflux disease)   4. Allergic rhinitis, unspecified seasonality, unspecified trigger    1.  Treat inflammation of airway:  A. Breztri - 2 inhalations 3-7 times per week with spacer B. Tezepelumab injections every 4 weeks C. Immunotherapy  2. Treat reflux induced respiratory tract inflammation:   A. Pantoprazole 40 mg - 1 tablet 3-7 times per week  3.  If Needed: AIRSUPRA - 2 inhalations every 6 hours  4.  Return to clinic in 6 months or earlier if needed.    Angela [name] has an opportunity to consolidate some of her medical therapy as she has had significant improvement while using  tezepelumab.  She can attempt to taper down her triple inhaler 3 times a week and she can attempt to taper down her proton pump inhibitor to 3 times per week.  She has a very good understanding of her disease state and how her medications work and appropriate dosing of her medications depending on disease activity.  I will see her back in this clinic in 6 months or earlier if there is a problem.  Laurette Schimke, MD Allergy / Immunology [facility]

## 2023-05-04 ENCOUNTER — Encounter: Payer: Self-pay | Admitting: Allergy and Immunology

## 2023-05-05 DIAGNOSIS — M21372 Foot drop, left foot: Secondary | ICD-10-CM | POA: Diagnosis not present

## 2023-05-10 DIAGNOSIS — M21372 Foot drop, left foot: Secondary | ICD-10-CM | POA: Diagnosis not present

## 2023-05-10 DIAGNOSIS — G5732 Lesion of lateral popliteal nerve, left lower limb: Secondary | ICD-10-CM | POA: Diagnosis not present

## 2023-05-15 HISTORY — PX: FOOT SURGERY: SHX648

## 2023-05-16 ENCOUNTER — Other Ambulatory Visit (HOSPITAL_COMMUNITY): Payer: Self-pay

## 2023-05-16 ENCOUNTER — Other Ambulatory Visit (HOSPITAL_COMMUNITY): Payer: Self-pay | Admitting: Pharmacy Technician

## 2023-05-16 NOTE — Progress Notes (Signed)
Specialty Pharmacy Refill Coordination Note  Angela [name] is a 67 y.o. female contacted today regarding refills of specialty medication(s) Tezepelumab-Ekko   Patient requested Courier to Provider Office   Delivery date: 05/24/23   Verified address: [facility] [address]   Medication will be filled on 05/23/23.

## 2023-05-19 DIAGNOSIS — Z0000 Encounter for general adult medical examination without abnormal findings: Secondary | ICD-10-CM | POA: Diagnosis not present

## 2023-05-19 DIAGNOSIS — Z131 Encounter for screening for diabetes mellitus: Secondary | ICD-10-CM | POA: Diagnosis not present

## 2023-05-19 DIAGNOSIS — Z01419 Encounter for gynecological examination (general) (routine) without abnormal findings: Secondary | ICD-10-CM | POA: Diagnosis not present

## 2023-05-19 DIAGNOSIS — E559 Vitamin D deficiency, unspecified: Secondary | ICD-10-CM | POA: Diagnosis not present

## 2023-05-19 DIAGNOSIS — Z1331 Encounter for screening for depression: Secondary | ICD-10-CM | POA: Diagnosis not present

## 2023-05-19 DIAGNOSIS — Z124 Encounter for screening for malignant neoplasm of cervix: Secondary | ICD-10-CM | POA: Diagnosis not present

## 2023-05-19 DIAGNOSIS — Z13220 Encounter for screening for lipoid disorders: Secondary | ICD-10-CM | POA: Diagnosis not present

## 2023-05-19 LAB — LAB REPORT - SCANNED
A1c: 5.9
EGFR: 98

## 2023-05-19 NOTE — Progress Notes (Signed)
VIALS EXP 07-09-23

## 2023-05-20 ENCOUNTER — Encounter: Payer: Self-pay | Admitting: Neurological Surgery

## 2023-05-23 ENCOUNTER — Other Ambulatory Visit: Payer: Self-pay

## 2023-05-23 ENCOUNTER — Other Ambulatory Visit (HOSPITAL_COMMUNITY): Payer: Self-pay

## 2023-05-23 DIAGNOSIS — J3089 Other allergic rhinitis: Secondary | ICD-10-CM | POA: Diagnosis not present

## 2023-05-23 NOTE — Progress Notes (Signed)
Patient was left a voicemail, due to insurance plan changes as of 05/15/23, patient must use [facility]. [facility] has been notified as well, Disenrolling from [employer].

## 2023-05-26 ENCOUNTER — Ambulatory Visit: Payer: BC Managed Care – PPO

## 2023-05-30 ENCOUNTER — Ambulatory Visit
Admission: RE | Admit: 2023-05-30 | Discharge: 2023-05-30 | Disposition: A | Discharge status: Home / Self Care | Payer: BC Managed Care – PPO | Source: Ambulatory Visit | Attending: Neurological Surgery | Admitting: Neurological Surgery

## 2023-05-30 DIAGNOSIS — M47812 Spondylosis without myelopathy or radiculopathy, cervical region: Secondary | ICD-10-CM | POA: Diagnosis not present

## 2023-05-30 DIAGNOSIS — R292 Abnormal reflex: Secondary | ICD-10-CM

## 2023-05-30 DIAGNOSIS — M4854XA Collapsed vertebra, not elsewhere classified, thoracic region, initial encounter for fracture: Secondary | ICD-10-CM | POA: Diagnosis not present

## 2023-05-30 DIAGNOSIS — M2578 Osteophyte, vertebrae: Secondary | ICD-10-CM | POA: Diagnosis not present

## 2023-05-31 DIAGNOSIS — M21371 Foot drop, right foot: Secondary | ICD-10-CM | POA: Diagnosis not present

## 2023-05-31 DIAGNOSIS — G5731 Lesion of lateral popliteal nerve, right lower limb: Secondary | ICD-10-CM | POA: Diagnosis not present

## 2023-05-31 DIAGNOSIS — G5732 Lesion of lateral popliteal nerve, left lower limb: Secondary | ICD-10-CM | POA: Diagnosis not present

## 2023-06-02 ENCOUNTER — Other Ambulatory Visit: Payer: BC Managed Care – PPO

## 2023-06-03 ENCOUNTER — Other Ambulatory Visit: Payer: BC Managed Care – PPO

## 2023-06-06 ENCOUNTER — Ambulatory Visit (INDEPENDENT_AMBULATORY_CARE_PROVIDER_SITE_OTHER): Payer: BC Managed Care – PPO

## 2023-06-06 DIAGNOSIS — J309 Allergic rhinitis, unspecified: Secondary | ICD-10-CM

## 2023-06-14 ENCOUNTER — Emergency Department (HOSPITAL_COMMUNITY): Payer: BC Managed Care – PPO

## 2023-06-14 ENCOUNTER — Other Ambulatory Visit: Payer: Self-pay

## 2023-06-14 ENCOUNTER — Encounter (HOSPITAL_COMMUNITY): Payer: Self-pay

## 2023-06-14 ENCOUNTER — Emergency Department (HOSPITAL_COMMUNITY)
Admission: EM | Admit: 2023-06-14 | Discharge: 2023-06-14 | Disposition: A | Discharge status: Home / Self Care | Payer: BC Managed Care – PPO | Attending: Emergency Medicine | Admitting: Emergency Medicine

## 2023-06-14 DIAGNOSIS — S42352A Displaced comminuted fracture of shaft of humerus, left arm, initial encounter for closed fracture: Secondary | ICD-10-CM | POA: Diagnosis not present

## 2023-06-14 DIAGNOSIS — W19XXXA Unspecified fall, initial encounter: Secondary | ICD-10-CM | POA: Diagnosis not present

## 2023-06-14 DIAGNOSIS — M25519 Pain in unspecified shoulder: Secondary | ICD-10-CM | POA: Diagnosis not present

## 2023-06-14 DIAGNOSIS — S4992XA Unspecified injury of left shoulder and upper arm, initial encounter: Secondary | ICD-10-CM | POA: Diagnosis not present

## 2023-06-14 DIAGNOSIS — W01198A Fall on same level from slipping, tripping and stumbling with subsequent striking against other object, initial encounter: Secondary | ICD-10-CM | POA: Insufficient documentation

## 2023-06-14 DIAGNOSIS — S43005A Unspecified dislocation of left shoulder joint, initial encounter: Secondary | ICD-10-CM | POA: Diagnosis not present

## 2023-06-14 DIAGNOSIS — S43015A Anterior dislocation of left humerus, initial encounter: Secondary | ICD-10-CM | POA: Diagnosis not present

## 2023-06-14 DIAGNOSIS — Z79899 Other long term (current) drug therapy: Secondary | ICD-10-CM | POA: Diagnosis not present

## 2023-06-14 DIAGNOSIS — S42292A Other displaced fracture of upper end of left humerus, initial encounter for closed fracture: Secondary | ICD-10-CM | POA: Diagnosis not present

## 2023-06-14 DIAGNOSIS — M19012 Primary osteoarthritis, left shoulder: Secondary | ICD-10-CM | POA: Diagnosis not present

## 2023-06-14 DIAGNOSIS — S43085A Other dislocation of left shoulder joint, initial encounter: Secondary | ICD-10-CM | POA: Diagnosis not present

## 2023-06-14 DIAGNOSIS — S42212A Unspecified displaced fracture of surgical neck of left humerus, initial encounter for closed fracture: Secondary | ICD-10-CM | POA: Insufficient documentation

## 2023-06-14 MED ORDER — LIDOCAINE-EPINEPHRINE (PF) 2 %-1:200000 IJ SOLN
20.0000 mL | Freq: Once | INTRAMUSCULAR | Status: DC
  Filled 2023-06-14: qty 20

## 2023-06-14 MED ORDER — PROPOFOL 10 MG/ML IV BOLUS: 0.5000 mg/kg | Freq: Once | INTRAVENOUS | Status: DC

## 2023-06-14 MED ORDER — FENTANYL CITRATE PF 50 MCG/ML IJ SOSY
100.0000 ug | PREFILLED_SYRINGE | Freq: Once | INTRAMUSCULAR | Status: AC
  Administered 2023-06-14: 100 ug via INTRAVENOUS
  Filled 2023-06-14: qty 2

## 2023-06-14 MED ORDER — MORPHINE SULFATE 15 MG PO TABS: 7.5000 mg | ORAL_TABLET | ORAL | 0 refills | Status: DC | PRN

## 2023-06-14 MED ORDER — ONDANSETRON 4 MG PO TBDP: ORAL_TABLET | ORAL | 0 refills | Status: DC

## 2023-06-14 MED ORDER — PROPOFOL 500 MG/50ML IV EMUL
INTRAVENOUS | Status: AC
  Filled 2023-06-14: qty 50

## 2023-06-14 NOTE — Discharge Instructions (Addendum)
 Please call the orthopedic office on the next workday and see when they can see you.  Sounds like a plan to see you in about a week.   Take 4 over the counter ibuprofen  tablets 3 times a day or 2 over-the-counter naproxen tablets twice a day for pain. Also take tylenol 1000mg (2 extra strength) four times a day.   Then take the pain medicine if you feel like you need it. Narcotics do not help with the pain, they only make you care about it less.  You can become addicted to this, people may break into your house to steal it.  It will constipate you.  If you drive under the influence of this medicine you can get a DUI.

## 2023-06-14 NOTE — ED Notes (Signed)
Pt discharged home. Discharge information discussed. No s/s of distress observed during discharge. 

## 2023-06-14 NOTE — Progress Notes (Signed)
 Orthopedic Tech Progress Note Patient Details:  Angela [name] [facility] [birth year] [id number]  Ortho Devices Type of Ortho Device: Shoulder immobilizer Ortho Device/Splint Location: left Ortho Device/Splint Interventions: Ordered, Application, Adjustment   Post Interventions Patient Tolerated: Well Instructions Provided: Adjustment of device, Care of device  Waylan Thom [name] 06/14/2023, 12:22 PM

## 2023-06-14 NOTE — ED Notes (Signed)
 1154am to 1210pm a total of of Propofol was administered for sedation

## 2023-06-14 NOTE — ED Provider Notes (Signed)
 [place] EMERGENCY DEPARTMENT AT [facility] Provider Note   CSN: [id number] Arrival date & time: 06/14/23  9090     History  Chief Complaint  Patient presents with   Angela [name] is a 67 y.o. female.  67 yo F with a chief complaints of left shoulder pain.  The patient had a recent peroneal nerve decompression and has had some instability with her gait she said that she lost her balance and fell and struck the left shoulder against a vehicle.  She denies any other specific injury.  Was picked up by EMS and brought here for evaluation.   Fall       Home Medications Prior to Admission medications   Medication Sig Start Date End Date Taking? Authorizing Provider  morphine  (MSIR) 15 MG tablet Take 0.5 tablets (7.5 mg total) by mouth every 4 (four) hours as needed for severe pain (pain score 7-10). 06/14/23  Yes Emil Share, DO  ondansetron  (ZOFRAN -ODT) 4 MG disintegrating tablet 4mg  ODT q4 hours prn nausea/vomit 06/14/23  Yes Kelley Knoth, DO  Albuterol -Budesonide  (AIRSUPRA ) 90-80 MCG/ACT AERO Inhale 2 Inhalations into the lungs every 4 (four) hours as needed. 05/03/23   Kozlow, Camellia PARAS, MD  Budeson-Glycopyrrol-Formoterol (BREZTRI  AEROSPHERE) 160-9-4.8 MCG/ACT AERO Inhale 2 puffs into the lungs in the morning and at bedtime. 05/03/23   Kozlow, Eric J, MD  Ergocalciferol (VITAMIN D2) 400 units TABS Take 1 tablet by mouth daily.    [provider]  estradiol (EVAMIST) 1.53 MG/SPRAY transdermal spray Place 2 sprays onto the skin daily.    [provider]  pantoprazole  (PROTONIX ) 40 MG tablet Take 1 tablet (40 mg total) by mouth every morning. 05/03/23   Kozlow, Camellia PARAS, MD  potassium chloride  SA (KLOR-CON  M) 20 MEQ tablet TAKE 1 TABLET(20 MEQ) BY MOUTH DAILY 07/16/21   Mercer Clotilda SAUNDERS, MD  progesterone (PROMETRIUM) 100 MG capsule Take 100 mg by mouth at bedtime. 06/19/20   [provider]  Risedronate Sodium 35 MG TBEC Take 35 mg by mouth  once a week.    [provider]  Spacer/Aero-Holding [employer] 1 Device by Does not apply route as directed. 05/03/23   Kozlow, Camellia PARAS, MD  Tezepelumab -ekko (TEZSPIRE ) 210 MG/1. SOAJ Inject 210 mg into the skin every 28 (twenty-eight) days. 03/15/23   Kozlow, Camellia PARAS, MD  triamterene -hydrochlorothiazide (DYAZIDE) 37.5-25 MG capsule TAKE 1 CAPSULE BY MOUTH EVERY DAY 09/03/22   Mercer Clotilda SAUNDERS, MD      Allergies    Codeine, Nsaids, and Pollen extract    Review of Systems   Review of Systems  Physical Exam Updated Vital Signs BP 106/86   Pulse 100   Temp 98 F (36.7 C) (Oral)   Resp (!) 22   Ht 5' 4 (1.626 m)   Wt 60.8 kg   SpO2 97%   BMI 23.00 kg/m  Physical Exam Vitals and nursing note reviewed.  Constitutional:      General: She is not in acute distress.    Appearance: She is well-developed. She is not diaphoretic.  HENT:     Head: Normocephalic and atraumatic.  Eyes:     Pupils: Pupils are equal, round, and reactive to light.  Cardiovascular:     Rate and Rhythm: Normal rate and regular rhythm.     Heart sounds: No murmur heard.    No friction rub. No gallop.  Pulmonary:     Effort: Pulmonary effort is normal.  Breath sounds: No wheezing or rales.  Abdominal:     General: There is no distension.     Palpations: Abdomen is soft.     Tenderness: There is no abdominal tenderness.  Musculoskeletal:        General: No tenderness.     Cervical back: Normal range of motion and neck supple.     Comments: Pulse motor and sensation are intact to the left hand.  She has an obvious deformity at the shoulder.  No obvious pain without the clavicle or the scapula.  No obvious pain at the elbow.  Skin:    General: Skin is warm and dry.  Neurological:     Mental Status: She is alert and oriented to person, place, and time.  Psychiatric:        Behavior: Behavior normal.     ED Results / Procedures / Treatments   Labs (all labs ordered are listed, but only  abnormal results are displayed) Labs Reviewed - No data to display  EKG None  Radiology DG Shoulder 1V Left Result Date: 06/14/2023 CLINICAL DATA:  Left shoulder pain after fall EXAM: LEFT SHOULDER COMPARISON:  None Available. FINDINGS: Fracture of the left humeral neck with apex medial angulation. Possible mildly displaced fracture of the inferior glenoid. Anterior inferior subluxation of the humeral head. IMPRESSION: 1. Angulated fracture of the left humeral neck. 2. Anterior shoulder dislocation. 3. Possible mildly displaced fracture of the inferior glenoid. Electronically Signed   By: Limin  Xu M.D.   On: 06/14/2023 11:13    Procedures .Reduction of dislocation  Date/Time: 06/14/2023 12:24 PM  Performed by: Emil Share, DO Authorized by: Emil Share, DO  Consent: Verbal consent obtained. Risks and benefits: risks, benefits and alternatives were discussed Consent given by: patient and spouse Patient understanding: patient states understanding of the procedure being performed Imaging studies: imaging studies available Patient identity confirmed: verbally with patient Time out: Immediately prior to procedure a time out was called to verify the correct patient, procedure, equipment, support staff and site/side marked as required. Preparation: Patient was prepped and draped in the usual sterile fashion. Local anesthesia used: yes Anesthesia: local infiltration  Anesthesia: Local anesthesia used: yes Local Anesthetic: lidocaine  2% with epinephrine  Anesthetic total: 20 mL  Sedation: Patient sedated: yes Sedation type: moderate (conscious) sedation Sedatives: propofol  Analgesia: fentanyl  Vitals: Vital signs were monitored during sedation.  Patient tolerance: patient tolerated the procedure well with no immediate complications   .Sedation  Date/Time: 06/14/2023 12:25 PM  Performed by: Emil Share, DO Authorized by: Emil Share, DO   Consent:    Consent obtained:  Verbal  and written   Consent given by:  Patient   Risks discussed:  Allergic reaction, dysrhythmia, inadequate sedation, nausea, vomiting, respiratory compromise necessitating ventilatory assistance and intubation and prolonged hypoxia resulting in organ damage   Alternatives discussed:  Analgesia without sedation Universal protocol:    Procedure explained and questions answered to patient or proxy's satisfaction: yes     Immediately prior to procedure, a time out was called: yes     Patient identity confirmed:  Anonymous protocol, patient vented/unresponsive and arm band Indications:    Procedure performed:  Dislocation reduction   Procedure necessitating sedation performed by:  Physician performing sedation Pre-sedation assessment:    Time since last food or drink:  4   ASA classification: class 1 - normal, healthy patient     Mouth opening:  2 finger widths   Thyromental distance:  3 finger widths  Mallampati score:  II - soft palate, uvula, fauces visible   Neck mobility: normal     Pre-sedation assessments completed and reviewed: airway patency, cardiovascular function, hydration status, mental status, nausea/vomiting, pain level, respiratory function and temperature   A pre-sedation assessment was completed prior to the start of the procedure Immediate pre-procedure details:    Reassessment: Patient reassessed immediately prior to procedure     Reviewed: vital signs     Verified: bag valve mask available, emergency equipment available, intubation equipment available, IV patency confirmed, oxygen available and suction available   Procedure details (see MAR for exact dosages):    Preoxygenation:  Nasal cannula   Sedation:  Propofol    Intended level of sedation: moderate (conscious sedation)   Analgesia:  Fentanyl    Intra-procedure monitoring:  Cardiac monitor, blood pressure monitoring, continuous capnometry, continuous pulse oximetry, frequent LOC assessments and frequent vital sign  checks   Intra-procedure events: none     Total Provider sedation time (minutes):  35 Post-procedure details:   A post-sedation assessment was completed following the completion of the procedure.   Attendance: Constant attendance by certified staff until patient recovered     Recovery: Patient returned to pre-procedure baseline     Post-sedation assessments completed and reviewed: airway patency, cardiovascular function, hydration status, mental status, nausea/vomiting, pain level, respiratory function and temperature     Patient is stable for discharge or admission: yes     Procedure completion:  Tolerated well, no immediate complications     Medications Ordered in ED Medications  lidocaine -EPINEPHrine  (XYLOCAINE  W/EPI) 2 %-1:200000 (PF) injection 20 mL (has no administration in time range)  propofol  (DIPRIVAN ) 10 mg/mL bolus/IV push 30.4 mg (has no administration in time range)  fentaNYL  (SUBLIMAZE ) injection 100 mcg (100 mcg Intravenous Given 06/14/23 1032)  fentaNYL  (SUBLIMAZE ) injection 100 mcg (100 mcg Intravenous Given 06/14/23 1152)  propofol  (DIPRIVAN ) 500 MG/50ML infusion (  New Bag/Given 06/14/23 1154)    ED Course/ Medical Decision Making/ A&P                                 Medical Decision Making Amount and/or Complexity of Data Reviewed Radiology: ordered.  Risk Prescription drug management.   67 yo F with a chief complaints of left shoulder pain.  Clinically the patient's shoulder is dislocated.  Will obtain a plain film.  Treat pain.  Reassess.  Plain film independently interpreted by me with a humeral neck fracture and humeral head dislocation.    Discussed with Dr. Ernie, recommends trial at closed reduction in ED.   Able to reduce the shoulder at bedside.  Plain film post independently interpreted by me with improved alignment.  I discussed the film with Dr. Ernie, recommended CT imaging sling and follow-up with orthopedics in the office.  1:37 PM:  I have  discussed the diagnosis/risks/treatment options with the patient and family.  Evaluation and diagnostic testing in the emergency department does not suggest an emergent condition requiring admission or immediate intervention beyond what has been performed at this time.  They will follow up with Ortho. We also discussed returning to the ED immediately if new or worsening sx occur. We discussed the sx which are most concerning (e.g., sudden worsening pain, fever, inability to tolerate by mouth) that necessitate immediate return. Medications administered to the patient during their visit and any new prescriptions provided to the patient are listed below.  Medications given during this visit  Medications  lidocaine -EPINEPHrine  (XYLOCAINE  W/EPI) 2 %-1:200000 (PF) injection 20 mL (has no administration in time range)  propofol  (DIPRIVAN ) 10 mg/mL bolus/IV push 30.4 mg (has no administration in time range)  fentaNYL  (SUBLIMAZE ) injection 100 mcg (100 mcg Intravenous Given 06/14/23 1032)  fentaNYL  (SUBLIMAZE ) injection 100 mcg (100 mcg Intravenous Given 06/14/23 1152)  propofol  (DIPRIVAN ) 500 MG/50ML infusion (  New Bag/Given 06/14/23 1154)     The patient appears reasonably screen and/or stabilized for discharge and I doubt any other medical condition or other [facility] requiring further screening, evaluation, or treatment in the ED at this time prior to discharge.           Final Clinical Impression(s) / ED Diagnoses Final diagnoses:  Dislocation of left shoulder joint, initial encounter  Closed fracture of neck of left humerus, initial encounter    Rx / DC Orders ED Discharge Orders          Ordered    morphine  (MSIR) 15 MG tablet  Every 4 hours PRN        06/14/23 1335    ondansetron  (ZOFRAN -ODT) 4 MG disintegrating tablet        06/14/23 1335              Emil Share, DO 06/14/23 1337

## 2023-06-14 NOTE — ED Triage Notes (Signed)
 The pt was bib EMS. The pt has a PMHx of left drop foot and has balance issues. The pt tripped, fell and left shoulder hit her car. EMS observes deformity. Has good ROM to left hand. VS B/P 140/90, P 98, O2 Sats 97%RA, Pain 8/10.

## 2023-06-21 DIAGNOSIS — S42212D Unspecified displaced fracture of surgical neck of left humerus, subsequent encounter for fracture with routine healing: Secondary | ICD-10-CM | POA: Diagnosis not present

## 2023-06-22 ENCOUNTER — Telehealth: Payer: Self-pay

## 2023-06-22 NOTE — Progress Notes (Signed)
 Transition Care Management Unsuccessful Follow-up Telephone Call  Date of discharge and from where:  06/14/2023 [facility]  Attempts:  1st Attempt  Reason for unsuccessful TCM follow-up call:  No answer/busy  Loa Idler [facility]  [facility], [facility] Resource Care Guide Direct Dial: [phone]  Website: [email or web address]

## 2023-06-23 ENCOUNTER — Telehealth: Payer: Self-pay

## 2023-06-23 ENCOUNTER — Ambulatory Visit: Payer: BC Managed Care – PPO

## 2023-06-23 DIAGNOSIS — J4550 Severe persistent asthma, uncomplicated: Secondary | ICD-10-CM

## 2023-06-23 NOTE — Progress Notes (Signed)
 Transition Care Management Follow-up Telephone Call Date of discharge and from where: 06/14/2023 [facility] How have you been since you were released from the hospital? The patient stated she is feeling much better and is very pleased with the care she received. Everyone was kind, patient and caring. Any questions or concerns? No  Items Reviewed: Did the pt receive and understand the discharge instructions provided? Yes  Medications obtained and verified? Yes  Other? No  Any new allergies since your discharge? No  Dietary orders reviewed? Yes Do you have support at home? Yes   Follow up appointments reviewed:  PCP Hospital f/u appt confirmed? No  Scheduled to see  on  @ . Specialist Hospital f/u appt confirmed?  Patient stated she has seen an Orthopedic Surgeon.  Scheduled to see  on  @ . Are transportation arrangements needed? No  If their condition worsens, is the pt aware to call PCP or go to the Emergency Dept.? Yes Was the patient provided with contact information for the PCP's office or ED? Yes Was to pt encouraged to call back with questions or concerns? Yes   Angela [name] [facility]  [facility], [facility] Guide Direct Dial: [phone]  Website: [email or web address]

## 2023-06-28 DIAGNOSIS — M25512 Pain in left shoulder: Secondary | ICD-10-CM | POA: Diagnosis not present

## 2023-06-28 DIAGNOSIS — R269 Unspecified abnormalities of gait and mobility: Secondary | ICD-10-CM | POA: Diagnosis not present

## 2023-06-28 DIAGNOSIS — M21371 Foot drop, right foot: Secondary | ICD-10-CM | POA: Diagnosis not present

## 2023-06-28 DIAGNOSIS — S42212D Unspecified displaced fracture of surgical neck of left humerus, subsequent encounter for fracture with routine healing: Secondary | ICD-10-CM | POA: Diagnosis not present

## 2023-06-29 ENCOUNTER — Other Ambulatory Visit: Payer: BC Managed Care – PPO

## 2023-06-29 ENCOUNTER — Encounter: Payer: Self-pay | Admitting: Family Medicine

## 2023-06-29 ENCOUNTER — Ambulatory Visit: Payer: BC Managed Care – PPO | Admitting: Family Medicine

## 2023-06-29 VITALS — BP 160/84 | HR 87 | Temp 98.7°F | Ht 64.0 in | Wt 137.4 lb

## 2023-06-29 DIAGNOSIS — I10 Essential (primary) hypertension: Secondary | ICD-10-CM | POA: Diagnosis not present

## 2023-06-29 DIAGNOSIS — E876 Hypokalemia: Secondary | ICD-10-CM

## 2023-06-29 DIAGNOSIS — S42212D Unspecified displaced fracture of surgical neck of left humerus, subsequent encounter for fracture with routine healing: Secondary | ICD-10-CM | POA: Diagnosis not present

## 2023-06-29 DIAGNOSIS — R011 Cardiac murmur, unspecified: Secondary | ICD-10-CM

## 2023-06-29 DIAGNOSIS — H6123 Impacted cerumen, bilateral: Secondary | ICD-10-CM | POA: Diagnosis not present

## 2023-06-29 MED ORDER — POTASSIUM CHLORIDE CRYS ER 20 MEQ PO TBCR: EXTENDED_RELEASE_TABLET | ORAL | 3 refills | Status: AC

## 2023-06-29 MED ORDER — TRIAMTERENE-HCTZ 37.5-25 MG PO CAPS: 1.0000 | ORAL_CAPSULE | Freq: Every day | ORAL | 3 refills | Status: AC

## 2023-06-29 NOTE — Patient Instructions (Signed)
 Debrox ear drops can be found over-the-counter at your local drugstore.  They can use to help soften earwax.

## 2023-06-29 NOTE — Progress Notes (Signed)
 Established Patient Office Visit   Subjective  Patient ID: Angela [name] [facility], female    DOB: Sep 01, 1955  Age: 68 y.o. MRN: [id number]  Chief Complaint  Patient presents with   Ear Fullness    Right ear   Patient is accompanied by her husband.  Patient is a 68 year old female seen for ear concern.  Patient endorses right ear fullness/muffled sound x 2 to 3 weeks.  Occasional popping in ear with standing up.  Patient denies pain in ear, facial pain/pressure.  Patient thinks that her ear may be full with wax as she stopped using Q-tips recently.  Patient had an eventful last few months.  Patient was in a boot and in PT for left fifth metatarsal fracture.  Progress in PT was slower than anticipated.  Foot drop noted.  Patient had EMG/NCS done.  Had bilateral peroneal nerve release with neurosurgery.  Shortly after pt fell while trying to get in her husband's car.  Patient dislocated left shoulder and fractured humerus.  Recovering and in good spirits.  Notes elevated BP due to taking Aleve.    Patient Active Problem List   Diagnosis Date Noted   Hypokalemia 04/15/2017   Abnormal EKG 04/13/2017   Pharyngitis, acute 07/22/2015   Acute upper respiratory infection 05/25/2014   PURE HYPERCHOLESTEROLEMIA 10/24/2008   SUPRAVENTRICULAR TACHYCARDIA 10/02/2008   OSTEOPENIA 08/05/2008   ESOPHAGEAL STRICTURE 10/12/2007   Essential hypertension 05/31/2007   Mitral valve prolapse 05/31/2007   Mild persistent chronic asthma  05/31/2007   GERD 05/31/2007   HIATAL HERNIA 05/31/2007   RENAL CALCULUS, HX OF 05/31/2007   Past Medical History:  Diagnosis Date   Asthma    Diverticulosis    Elevated C-reactive protein (CRP)    Esophageal stricture    GERD (gastroesophageal reflux disease)    HH (hiatus hernia)    Hypertension    MVP (mitral valve prolapse)    Osteoporosis    Renal calculus    Past Surgical History:  Procedure Laterality Date   Broken Elbow Right    Broken Finger Right     Ring Finger   FOOT FRACTURE SURGERY Left 06/20/2022   Social History   Tobacco Use   Smoking status: Never   Smokeless tobacco: Never  Vaping Use   Vaping status: Never Used  Substance Use Topics   Alcohol use: Yes    Comment: 3 times a week   Drug use: No   Family History  Problem Relation Age of Onset   Breast cancer Maternal Aunt        x 2   Heart disease Maternal Uncle        multiple   Heart attack Father    Anuerysm Mother        aortal   Colon cancer Neg Hx    Esophageal cancer Neg Hx    Liver cancer Neg Hx    Pancreatic cancer Neg Hx    Rectal cancer Neg Hx    Stomach cancer Neg Hx    Allergies  Allergen Reactions   Codeine Nausea And Vomiting, Diarrhea and Nausea Only   Nsaids Other (See Comments)    BP RISE   Pollen Extract Itching      ROS Negative unless stated above    Objective:     BP (!) 160/84 (BP Location: Right Arm, Patient Position: Sitting, Cuff Size: Normal) Comment: patient is taking Aleve which make BP high  Pulse 87   Temp 98.7 F (37.1 C) (Oral)  Ht 5\' 4"  (1.626 m)   Wt 137 lb 6.4 oz (62.3 kg)   SpO2 96%   BMI 23.58 kg/m  BP Readings from Last 3 Encounters:  06/29/23 (!) 160/84  06/14/23 106/86  05/03/23 136/88   Wt Readings from Last 3 Encounters:  06/29/23 137 lb 6.4 oz (62.3 kg)  06/14/23 134 lb (60.8 kg)  05/03/23 136 lb 6.4 oz (61.9 kg)      Physical Exam Constitutional:      General: She is not in acute distress.    Appearance: Normal appearance.  HENT:     Head: Normocephalic and atraumatic.     Right Ear: Decreased hearing noted. There is impacted cerumen.     Left Ear: There is impacted cerumen.     Ears:     Comments: Impacted cerumen in right canal blocking TM.  Left canal with scant to moderate cerumen partially occluding view of TM.  Left TM appears normal.  B/l ears irrigated.  Cerumen remained in right canal.  Curette and alligator forceps used to remove large amount of cerumen and dried skin from  right canal.    Nose: Nose normal.     Mouth/Throat:     Mouth: Mucous membranes are moist.  Cardiovascular:     Rate and Rhythm: Normal rate and regular rhythm.     Heart sounds: Murmur heard.     Systolic murmur is present with a grade of 4/6.     No gallop.  Pulmonary:     Effort: Pulmonary effort is normal. No respiratory distress.     Breath sounds: Normal breath sounds. No wheezing, rhonchi or rales.  Musculoskeletal:     Comments: Left arm in immobilizer sling.  Skin:    General: Skin is warm and dry.  Neurological:     Mental Status: She is alert and oriented to person, place, and time.     Cranial Nerves: Cranial nerves 2-12 are intact.     Gait: Gait abnormal.     Comments: B/l foot drop, L>R.    No results found for any visits on 06/29/23.    Assessment & Plan:  Bilateral impacted cerumen -Consent obtained.  Bilateral ears irrigated.  Curette and alligator forceps used in right canal to remove firm impacted cerumen.  Patient tolerated procedure well.  Hearing improved immediately. -OTC Debrox eardrops as needed. -Given handout  Essential hypertension -Elevated -Recheck -Likely 2/2 increased NSAID use and pain. -Continue to monitor.  For continued elevation consistently greater than 140/90 adjust medication doses. -     Triamterene -HCTZ; Take 1 each (1 capsule total) by mouth daily.  Dispense: 90 capsule; Refill: 3 -     ECHOCARDIOGRAM COMPLETE; Future  Closed fracture of neck of left humerus with routine healing, subsequent encounter -Stable -Continue wearing immobilizer sling on left arm -Continue follow-up with Ortho  Hypokalemia -     Potassium Chloride  Crys ER; TAKE 1 TABLET(20 MEQ) BY MOUTH DAILY  Dispense: 90 tablet; Refill: 3  Murmur -History of murmur since childhood.  Slightly worse on exam this visit. -Previously seen by cardiology, no recent visits. -Will obtain echo -Placing referral to cards if needed based on results of echo. -      ECHOCARDIOGRAM COMPLETE; Future   Return if symptoms worsen or fail to improve.   Angela Greulich, MD

## 2023-07-01 DIAGNOSIS — R269 Unspecified abnormalities of gait and mobility: Secondary | ICD-10-CM | POA: Diagnosis not present

## 2023-07-01 DIAGNOSIS — M21371 Foot drop, right foot: Secondary | ICD-10-CM | POA: Diagnosis not present

## 2023-07-01 DIAGNOSIS — M25512 Pain in left shoulder: Secondary | ICD-10-CM | POA: Diagnosis not present

## 2023-07-05 DIAGNOSIS — M21371 Foot drop, right foot: Secondary | ICD-10-CM | POA: Diagnosis not present

## 2023-07-05 DIAGNOSIS — M25512 Pain in left shoulder: Secondary | ICD-10-CM | POA: Diagnosis not present

## 2023-07-05 DIAGNOSIS — R269 Unspecified abnormalities of gait and mobility: Secondary | ICD-10-CM | POA: Diagnosis not present

## 2023-07-08 DIAGNOSIS — M21371 Foot drop, right foot: Secondary | ICD-10-CM | POA: Diagnosis not present

## 2023-07-08 DIAGNOSIS — M25512 Pain in left shoulder: Secondary | ICD-10-CM | POA: Diagnosis not present

## 2023-07-08 DIAGNOSIS — R269 Unspecified abnormalities of gait and mobility: Secondary | ICD-10-CM | POA: Diagnosis not present

## 2023-07-12 ENCOUNTER — Encounter: Payer: Self-pay | Admitting: *Deleted

## 2023-07-12 DIAGNOSIS — S42212D Unspecified displaced fracture of surgical neck of left humerus, subsequent encounter for fracture with routine healing: Secondary | ICD-10-CM | POA: Diagnosis not present

## 2023-07-13 ENCOUNTER — Ambulatory Visit: Payer: BC Managed Care – PPO | Admitting: Diagnostic Neuroimaging

## 2023-07-13 ENCOUNTER — Encounter: Payer: Self-pay | Admitting: Diagnostic Neuroimaging

## 2023-07-13 VITALS — BP 145/90 | HR 107 | Ht 62.0 in | Wt 135.0 lb

## 2023-07-13 DIAGNOSIS — M25512 Pain in left shoulder: Secondary | ICD-10-CM | POA: Diagnosis not present

## 2023-07-13 DIAGNOSIS — G629 Polyneuropathy, unspecified: Secondary | ICD-10-CM | POA: Diagnosis not present

## 2023-07-13 DIAGNOSIS — R269 Unspecified abnormalities of gait and mobility: Secondary | ICD-10-CM | POA: Diagnosis not present

## 2023-07-13 DIAGNOSIS — M21371 Foot drop, right foot: Secondary | ICD-10-CM | POA: Diagnosis not present

## 2023-07-13 NOTE — Progress Notes (Signed)
[facility]  PATIENT: Angela [name] [facility] DOB: [birth year]  REFERRING CLINICIAN: Toni Arthurs, MD HISTORY FROM: patient  REASON FOR VISIT: new consult   HISTORICAL  CHIEF COMPLAINT:  Chief Complaint  Patient presents with   New Patient (Initial Visit)    Pt in room 7. Husband in room. New patient here for Gait abnormality. Pt developed left foot drop in Dec. From broken foot.  Pt states she had a fall on 06/14/23 and broke left shoulder. Pt currently in physical therapy, history had nerve conduction study.     HISTORY OF PRESENT ILLNESS:   68 year old female here for evaluation of lower extremity weakness and gait difficulty.  December 2023 patient was walking when she was knocked over by 2 dogs jumping around.  She fractured her left fifth metatarsal.  She underwent surgery a few weeks later in January 2024.  She was in a left leg boot immobilizer for several months to recover.  In April 2024 she underwent physical therapy to improve her walking.  She improved slightly.  However by summer time she noticed that she was starting to decline in her gait and walking.  She noticed problems mainly with her left foot and ankle stability.  In September 2024 she had left lower extremity EMG nerve conduction study which demonstrated abnormalities of left peroneal motor nerve, left tibial motor nerve, and chronic denervation affecting left vastus medialis, left gastrocnemius, left tibialis anterior muscles.  MRI of the left hip, left knee and lumbar spine were performed and unremarkable.  Then she underwent left peroneal decompression surgery in November 2024.  She had another EMG nerve conduction study which showed bilateral peroneal and bilateral tibial motor response abnormalities, absent right sural response, and active and chronic denervation in multiple muscles of the right and left lower extremities.  She underwent right peroneal decompression surgery in December 2024.  Since  that time patient feels like some symptoms have slightly improved because she is no longer having to use AFOs.  However she still has significant weakness and gait abnormality.  Unfortunately on 06/14/2023 she fell again and fractured her left humerus.  She is not currently using a cane or walker.    REVIEW OF SYSTEMS: Full 14 system review of systems performed and negative with exception of: as per HPI.  ALLERGIES: Allergies  Allergen Reactions   Codeine Nausea And Vomiting, Diarrhea and Nausea Only   Nsaids Other (See Comments)    BP RISE   Pollen Extract Itching    HOME MEDICATIONS: Outpatient Medications Prior to Visit  Medication Sig Dispense Refill   Albuterol-Budesonide (AIRSUPRA) 90-80 MCG/ACT AERO Inhale 2 Inhalations into the lungs every 4 (four) hours as needed. 11 g 1   Budeson-Glycopyrrol-Formoterol (BREZTRI AEROSPHERE) 160-9-4.8 MCG/ACT AERO Inhale 2 puffs into the lungs in the morning and at bedtime. 23.1 g 1   Ergocalciferol (VITAMIN D2) 400 units TABS Take 1 tablet by mouth daily.     estradiol (EVAMIST) 1.53 MG/SPRAY transdermal spray Place 2 sprays onto the skin daily.     pantoprazole (PROTONIX) 40 MG tablet Take 1 tablet (40 mg total) by mouth every morning. 90 tablet 1   potassium chloride SA (KLOR-CON M) 20 MEQ tablet TAKE 1 TABLET(20 MEQ) BY MOUTH DAILY 90 tablet 3   progesterone (PROMETRIUM) 100 MG capsule Take 100 mg by mouth at bedtime.     Risedronate Sodium 35 MG TBEC Take 35 mg by mouth once a week.     Spacer/Aero-Holding Deretha Emory DEVI 1  Device by Does not apply route as directed. 1 each 1   Tezepelumab-ekko (TEZSPIRE) 210 MG/1. SOAJ Inject 210 mg into the skin every 28 (twenty-eight) days. 1.91 mL 11   triamterene-hydrochlorothiazide (DYAZIDE) 37.5-25 MG capsule Take 1 each (1 capsule total) by mouth daily. 90 capsule 3   morphine (MSIR) 15 MG tablet Take 0.5 tablets (7.5 mg total) by mouth every 4 (four) hours as needed for severe pain (pain score  7-10). (Patient not taking: Reported on 06/29/2023) 10 tablet 0   ondansetron (ZOFRAN-ODT) 4 MG disintegrating tablet 4mg  ODT q4 hours prn nausea/vomit (Patient not taking: Reported on 06/29/2023) 20 tablet 0   Facility-Administered Medications Prior to Visit  Medication Dose Route Frequency Provider Last Rate Last Admin   tezepelumab-ekko (TEZSPIRE) 210 MG/1. syringe 210 mg  210 mg Subcutaneous Q28 days Jessica Priest, MD   210 mg at 06/23/23 1331    PAST MEDICAL HISTORY: Past Medical History:  Diagnosis Date   Asthma    Diverticulosis    Elevated C-reactive protein (CRP)    Esophageal stricture    GERD (gastroesophageal reflux disease)    HH (hiatus hernia)    Hypertension    MVP (mitral valve prolapse)    Osteoporosis    Renal calculus     PAST SURGICAL HISTORY: Past Surgical History:  Procedure Laterality Date   Broken Elbow Right    Broken Finger Right    Ring Finger   FOOT FRACTURE SURGERY Left 06/20/2022   FOOT SURGERY Left    nerve decompression surgery    FAMILY HISTORY: Family History  Problem Relation Age of Onset   Breast cancer Maternal Aunt        x 2   Heart disease Maternal Uncle        multiple   Heart attack Father    Anuerysm Mother        aortal   Colon cancer Neg Hx    Esophageal cancer Neg Hx    Liver cancer Neg Hx    Pancreatic cancer Neg Hx    Rectal cancer Neg Hx    Stomach cancer Neg Hx     SOCIAL HISTORY: Social History   Socioeconomic History   Marital status: Married    Spouse name: Not on file   Number of children: 0   Years of education: Not on file   Highest education level: Not on file  Occupational History   Occupation: ATTORNEY    Employer: [employer]  Tobacco Use   Smoking status: Never   Smokeless tobacco: Never  Vaping Use   Vaping status: Never Used  Substance and Sexual Activity   Alcohol use: Yes    Comment: 3 times a week   Drug use: No   Sexual activity: Yes  Other Topics Concern   Not on file   Social History Narrative   Right handed    Wears contacts lens   Drinks de-coffee    Social Drivers of Health   Financial Resource Strain: Low Risk  (06/23/2023)   Overall Financial Resource Strain (CARDIA)    Difficulty of Paying Living Expenses: Not hard at all  Food Insecurity: No Food Insecurity (06/23/2023)   Hunger Vital Sign    Worried About Radiation protection practitioner of Food in the Last Year: Never true    Ran Out of Food in the Last Year: Never true  Transportation Needs: No Transportation Needs (06/23/2023)   PRAPARE - Administrator, Civil Service (Medical): No  Lack of Transportation (Non-Medical): No  Physical Activity: Not on file  Stress: Not on file  Social Connections: Not on file  Intimate Partner Violence: Not on file     PHYSICAL EXAM  GENERAL EXAM/CONSTITUTIONAL: Vitals:  Vitals:   07/13/23 1048 07/13/23 1058  BP: (!) 145/93 (!) 145/90  Pulse: (!) 107   Weight: 135 lb (61.2 kg)   Height: 5\' 2"  (1.575 m)    Body mass index is 24.69 kg/m. Wt Readings from Last 3 Encounters:  07/13/23 135 lb (61.2 kg)  06/29/23 137 lb 6.4 oz (62.3 kg)  06/14/23 134 lb (60.8 kg)   Patient is in no distress; well developed, nourished and groomed; neck is supple  CARDIOVASCULAR: Examination of carotid arteries is normal; no carotid bruits Regular rate and rhythm, no murmurs Examination of peripheral vascular system by observation and palpation is normal  EYES: Ophthalmoscopic exam of optic discs and posterior segments is normal; no papilledema or hemorrhages No results found.  MUSCULOSKELETAL: Gait, strength, tone, movements noted in Neurologic exam below  NEUROLOGIC: MENTAL STATUS:      No data to display         awake, alert, oriented to person, place and time recent and remote memory intact normal attention and concentration language fluent, comprehension intact, naming intact fund of knowledge appropriate  CRANIAL NERVE:  2nd - no papilledema on  fundoscopic exam 2nd, 3rd, 4th, 6th - pupils equal and reactive to light, visual fields full to confrontation, extraocular muscles intact, no nystagmus 5th - facial sensation symmetric 7th - facial strength symmetric 8th - hearing intact 9th - palate elevates symmetrically, uvula midline 11th - shoulder shrug symmetric 12th - tongue protrusion midline  MOTOR:  normal bulk and tone, full strength in the RUE; LUE LIMITED DUE TO RECENT LET HUMERUS FRACTURE BLE (HF 5, KE /KF 5; DF / PF 3; INV / EVER 3)  SENSORY:  normal and symmetric to light touch, pinprick, temperature, vibration; SLIGHTLY REDUCED IN FEET  COORDINATION:  finger-nose-finger, fine finger movements normal  REFLEXES:  deep tendon reflexes --> BUE 3, KNEES 3; ANKLES 1; POSITIVE HOFFMANS BILATERALLY; POSITIVE SUPRA-PATELLAR BILATERALLY; MUTE TOES  GAIT/STATION:  BILATERAL STEPPAGE GAIT     DIAGNOSTIC DATA (LABS, IMAGING, TESTING) - I reviewed patient records, labs, notes, testing and imaging myself where available.  Lab Results  Component Value Date   WBC 7.2 03/01/2023   HGB 13.2 03/01/2023   HCT 42.9 03/01/2023   MCV 84 03/01/2023   PLT 308 03/01/2023      Component Value Date/Time   NA 139 01/30/2018 0959   NA 139 04/26/2017 0911   K 3.9 02/24/2018 0858   CL 104 01/30/2018 0959   CO2 28 01/30/2018 0959   GLUCOSE 98 01/30/2018 0959   BUN 16 01/30/2018 0959   BUN 14 04/26/2017 0911   CREATININE 0.87 01/30/2018 0959   CALCIUM 9.8 01/30/2018 0959   PROT 6.6 10/15/2010 1024   ALBUMIN 3.8 10/15/2010 1024   AST 18 10/15/2010 1024   ALT 18 10/15/2010 1024   ALKPHOS 52 10/15/2010 1024   BILITOT 0.4 10/15/2010 1024   GFRNONAA 72 04/26/2017 0911   GFRAA 83 04/26/2017 0911   Lab Results  Component Value Date   CHOL 234 (H) 10/15/2010   HDL 89.30 10/15/2010   LDLCALC 117 (H) 10/21/2008   LDLDIRECT 133.1 10/15/2010   TRIG 47.0 10/15/2010   CHOLHDL 3 10/15/2010   No results found for: "HGBA1C" No  results found for: "VITAMINB12" No  results found for: "TSH"  05/30/23 MRI cervical and thoracic spine 1. No acute abnormality cervical or thoracic spine. No finding to explain the patient's symptoms. 2. Mild cervical spondylosis. Mild foraminal narrowing on the left at C3-4 and bilaterally at C4-5. 3. Chronic compression fractures of T5 and T7. Minimal marrow edema anteriorly in T7 could be due to a recent component of the fracture. 4. Hiatal hernia.    ASSESSMENT AND PLAN  68 y.o. year old female here with:   Dx:  1. Neuropathy      PLAN:  PROGRESSIVE LOWER EXTREMITY NEUROPATHY (neuro exam and EMG nerve conduction studies demonstrate abnormalities of bilateral peroneal and tibial motor nerves, mild sensory abnormalities of the distal feet, decreased reflexes at the ankles, and hyperreflexia in the upper extremities and knees.  Suspect patient has an underlying progressive polyneuropathy such as CIDP rather than compression neuropathies. Will proceed with further workup.) - repeat EMG/NCS (RUE, BLE; CIDP eval) - then check neuropathy panel - use AFOs, cane / walker for now; fall precautions reviewed  HYPERREFLEXIA (symmetric; unclear cause based on MRI of the cervical thoracic spine; will proceed with MRI brain to rule out other causes) - check MRI brain  Orders Placed This Encounter  Procedures   MR BRAIN W WO CONTRAST   NCV with EMG(electromyography)   No follow-ups on file.  I spent 60 minutes of face-to-face and non-face-to-face time with patient.  This included previsit chart review, lab review, study review, order entry, electronic health record documentation, patient education.     Suanne Marker, MD 07/13/2023, 12:17 PM Certified in Neurology, Neurophysiology and Neuroimaging  [facility] [address], [address] [place], [place] [address] [phone]

## 2023-07-14 ENCOUNTER — Ambulatory Visit (INDEPENDENT_AMBULATORY_CARE_PROVIDER_SITE_OTHER): Payer: BC Managed Care – PPO | Admitting: Diagnostic Neuroimaging

## 2023-07-14 ENCOUNTER — Ambulatory Visit: Payer: Self-pay | Admitting: Diagnostic Neuroimaging

## 2023-07-14 DIAGNOSIS — Z0289 Encounter for other administrative examinations: Secondary | ICD-10-CM

## 2023-07-14 DIAGNOSIS — G629 Polyneuropathy, unspecified: Secondary | ICD-10-CM | POA: Diagnosis not present

## 2023-07-14 NOTE — Progress Notes (Signed)
  Addl exam findings: Right foot hammertoes, with high arch, not prominent in left foot. No visible fasciculations in upper or lower extremities, although they are noted on needle EMG. Hyperreflexia persistent.   Abnormal EMG/NCS study. Given the clinical context, considerations include immune neuropathies (CIDP, anti-GM1, monoclonal gammopathy), hereditary neuropathies (CMT, HMSN), or metabolic causes (diabetes, hypothyroidism, liver disease).  The presence of hyperreflexia and fasciculations does also raise possibility of disorders of the motor neurons and/or their axons. Will proceed with workup.    Orders Placed This Encounter  Procedures   DG FL GUIDED LUMBAR PUNCTURE   Multiple Myeloma Panel (SPEP&IFE w/QIG)   ANA,IFA RA Diag Pnl w/rflx Tit/Patn   GM1 IgM Autoantibodies   GM1 IgG Autoantibodies   Vitamin B12   TSH Rfx on Abnormal to Free T4   CK   Aldolase   Glutamic acid decarboxylase auto abs    Suanne Marker, MD 07/14/2023, 3:47 PM Certified in Neurology, Neurophysiology and Neuroimaging  [facility] [address], [address] [place], [place] [address] [phone]

## 2023-07-15 ENCOUNTER — Other Ambulatory Visit (INDEPENDENT_AMBULATORY_CARE_PROVIDER_SITE_OTHER): Payer: Self-pay

## 2023-07-15 DIAGNOSIS — M25512 Pain in left shoulder: Secondary | ICD-10-CM | POA: Diagnosis not present

## 2023-07-15 DIAGNOSIS — G629 Polyneuropathy, unspecified: Secondary | ICD-10-CM | POA: Diagnosis not present

## 2023-07-15 DIAGNOSIS — M21371 Foot drop, right foot: Secondary | ICD-10-CM | POA: Diagnosis not present

## 2023-07-15 DIAGNOSIS — R269 Unspecified abnormalities of gait and mobility: Secondary | ICD-10-CM | POA: Diagnosis not present

## 2023-07-15 DIAGNOSIS — Z0289 Encounter for other administrative examinations: Secondary | ICD-10-CM

## 2023-07-16 LAB — TSH RFX ON ABNORMAL TO FREE T4: TSH: 1.71 u[IU]/mL (ref 0.450–4.500)

## 2023-07-18 NOTE — Procedures (Signed)
[facility]  NCS (NERVE CONDUCTION STUDY) WITH EMG (ELECTROMYOGRAPHY) REPORT   STUDY DATE: 07/14/23 PATIENT NAME: Angela [name] DOB: 1956-01-23 MRN: [id number]  ORDERING CLINICIAN: Joycelyn Schmid, MD  TECHNOLOGIST: Jenelle Mages ELECTROMYOGRAPHER: Glenford Bayley. Tyronda Vizcarrondo, MD  CLINICAL INFORMATION: 68 year old female with weakness.  FINDINGS: NERVE CONDUCTION STUDY:  Right median and right ulnar motor responses are normal.  Bilateral peroneal motor responses could not be obtained.  Right tibial motor responses prolonged distal latency, temporal dispersion, decreased amplitude and slow conduction velocity (29 m/s).  Left tibial motor response has normal distal latency, decreased amplitude and slow conduction velocity (35 m/s).  Right ulnar F-wave latency is normal.  Left tibial F-wave latency is significantly prolonged at 76.2 ms.  Right median and right ulnar sensory responses are normal.  Bilateral superficial peroneal sensory responses have decreased amplitudes.  Left sural sensory response has slightly decreased amplitude.   NEEDLE ELECTROMYOGRAPHY:  Needle examination of right upper and right lower extremity notable for chronic denervation in right flexor carpi radialis, right first dorsal interosseous, right vastus medialis, right tibialis anterior, right gastrocnemius muscles.  Active denervation with fibrillation potentials and positive sharp waves noted in right vastus medialis, right tibialis anterior and right gastrocnemius muscles.  Fasciculations also noted in right vastus medialis, right gastrocnemius, right biceps, right triceps, left lumbar paraspinal muscles. Left cervical and left thoracic paraspinal muscles are unremarkable.   IMPRESSION:   Abnormal study demonstrating: -Axonal, length-dependent sensorimotor polyneuropathy.  Motor responses are more significantly affected than sensory responses.  Subtle demyelinating features noted with  temporal dispersion of right tibial motor response and significantly prolonged left tibial F-wave latency of 76 m/s. -Chronic denervation noted in right upper extremity, and active and chronic denervation noted in the right lower extremity.     INTERPRETING PHYSICIAN:  Suanne Marker, MD Certified in Neurology, Neurophysiology and Neuroimaging  [facility] [address], [address] [place], [place] [address] [phone]  [facility]    Nerve / Sites Muscle Latency Ref. Amplitude Ref. Rel Amp Segments Distance Velocity Ref. Area    ms ms mV mV %  cm m/s m/s mVms  R Median - APB     Wrist APB 3.9 <=4.4 3.2 >=4.0 100 Wrist - APB 7   10.5     Upper arm APB 8.1  3.0  90.9 Upper arm - Wrist 23.6 57 >=49 9.8  R Ulnar - ADM     Wrist ADM 2.6 <=3.3 5.2 >=6.0 100 Wrist - ADM 7   20.1     B.Elbow ADM 4.5  4.2  81.7 B.Elbow - Wrist 11 59 >=49 18.4     A.Elbow ADM 7.0  3.8  90.9 A.Elbow - B.Elbow 15 60 >=49 18.0  R Peroneal - EDB     Ankle EDB NR <=6.5 NR >=2.0 NR Ankle - EDB 9   NR         Pop fossa - Ankle      L Peroneal - EDB     Ankle EDB NR <=6.5 NR >=2.0 NR Ankle - EDB 9   NR         Pop fossa - Ankle      R Tibial - AH     Ankle AH 7.1 <=5.8 0.6 >=4.0 100 Ankle - AH 9   4.2     Pop fossa AH 18.4  0.8  134 Pop fossa - Ankle 33 29 >=41 3.2  L Tibial - AH  Ankle AH 4.7 <=5.8 0.8 >=4.0 100 Ankle - AH 9   2.0     Pop fossa AH 14.7  0.6  70.1 Pop fossa - Ankle 35 35 >=41 1.1                 SNC    Nerve / Sites Rec. Site Peak Lat Ref.  Amp Ref. Segments Distance Peak Diff    ms ms V V  cm ms  L Sural - Ankle (Calf)     Calf Ankle 3.6 <=4.4 5 >=6 Calf - Ankle 11   R Superficial peroneal - Ankle     Lat leg Ankle 3.9 <=4.4 3 >=6 Lat leg - Ankle 14      2 Ankle 3.6  3  2  - Lat leg  -0.4  L Superficial peroneal - Ankle     Lat leg Ankle 2.9 <=4.4 3 >=6 Lat leg - Ankle 11   R Median - Orthodromic (Dig II, Mid palm)     Dig II Wrist 3.0 <=3.4 12 >=10 Dig II - Wrist  13   R Ulnar - Orthodromic, (Dig V, Mid palm)     Dig V Wrist 2.9 <=3.1 6 >=5 Dig V - Wrist 36                F  Wave    Nerve F Lat Ref.   ms ms  L Tibial - AH 76.2 <=56.0  R Ulnar - ADM 28.4 <=32.0         EMG Summary Table    Spontaneous MUAP Recruitment  Muscle IA Fib PSW Fasc Other Amp Dur. Poly Pattern  R. Vastus medialis Normal 1+ 1+ Occasional _______ Increased Normal 1+ Discrete  R. Tibialis anterior Normal 2+ 2+ None _______ Increased Normal 1+ Reduced  R. Gastrocnemius (Medial head) Normal 2+ 2+ Occasional CRDs Increased Normal 1+ Discrete  R. Iliopsoas Normal None None None _______ Normal Normal Normal Normal  R. Deltoid Normal None None None _______ Normal Normal Normal Normal  R. Biceps brachii Normal None None Rare _______ Normal Normal Normal Normal  R. Triceps brachii Normal None None Rare _______ Normal Normal Normal Normal  R. Flexor carpi radialis Normal None None None _______ Increased Normal Normal Reduced  R. First dorsal interosseous Normal None None None _______ Increased Normal Normal Reduced  L. Cervical paraspinals Normal None None None decr relaxation Normal Normal Normal Normal  L. Thoracic paraspinals (mid) Normal None None None  Normal Normal Normal Normal  L. Lumbar paraspinals Normal  None Rare _______ Normal Normal Normal Normal

## 2023-07-19 DIAGNOSIS — M21371 Foot drop, right foot: Secondary | ICD-10-CM | POA: Diagnosis not present

## 2023-07-19 DIAGNOSIS — R269 Unspecified abnormalities of gait and mobility: Secondary | ICD-10-CM | POA: Diagnosis not present

## 2023-07-19 DIAGNOSIS — M25512 Pain in left shoulder: Secondary | ICD-10-CM | POA: Diagnosis not present

## 2023-07-20 LAB — ANA,IFA RA DIAG PNL W/RFLX TIT/PATN
Cyclic Citrullin Peptide Ab: 3 U (ref 0–19)
Rheumatoid fact SerPl-aCnc: <10 [IU]/mL (ref <14.0)

## 2023-07-20 LAB — MULTIPLE MYELOMA PANEL, SERUM
Albumin SerPl Elph-Mcnc: 3.7 g/dL (ref 2.9–4.4)
Albumin/Glob SerPl: 1.2 (ref 0.7–1.7)
Alpha 1: 0.3 g/dL (ref 0.0–0.4)
Alpha2 Glob SerPl Elph-Mcnc: 1 g/dL (ref 0.4–1.0)
B-Globulin SerPl Elph-Mcnc: 1.2 g/dL (ref 0.7–1.3)
Gamma Glob SerPl Elph-Mcnc: 0.8 g/dL (ref 0.4–1.8)
Globulin, Total: 3.3 g/dL (ref 2.2–3.9)
IgA/Immunoglobulin A, Serum: 200 mg/dL (ref 87–352)
IgG (Immunoglobin G), Serum: 927 mg/dL (ref 586–1602)
IgM (Immunoglobulin M), Srm: 101 mg/dL (ref 26–217)
Total Protein: 7 g/dL (ref 6.0–8.5)

## 2023-07-20 LAB — VITAMIN B12: Vitamin B-12: 1416 pg/mL — ABNORMAL HIGH (ref 232–1245)

## 2023-07-20 LAB — CK: Total CK: 206 U/L — ABNORMAL HIGH (ref 32–182)

## 2023-07-20 LAB — GM1 IGG AUTOANTIBODIES

## 2023-07-20 LAB — GM1 IGM AUTOANTIBODIES

## 2023-07-20 LAB — ALDOLASE: Aldolase: 5.9 U/L (ref 3.3–10.3)

## 2023-07-20 LAB — INTERPRETATION

## 2023-07-20 LAB — GLUTAMIC ACID DECARBOXYLASE AUTO ABS

## 2023-07-21 ENCOUNTER — Ambulatory Visit: Payer: BC Managed Care – PPO

## 2023-07-21 DIAGNOSIS — J4550 Severe persistent asthma, uncomplicated: Secondary | ICD-10-CM | POA: Diagnosis not present

## 2023-07-22 DIAGNOSIS — M25512 Pain in left shoulder: Secondary | ICD-10-CM | POA: Diagnosis not present

## 2023-07-22 DIAGNOSIS — R269 Unspecified abnormalities of gait and mobility: Secondary | ICD-10-CM | POA: Diagnosis not present

## 2023-07-22 DIAGNOSIS — M21371 Foot drop, right foot: Secondary | ICD-10-CM | POA: Diagnosis not present

## 2023-07-26 DIAGNOSIS — M25512 Pain in left shoulder: Secondary | ICD-10-CM | POA: Diagnosis not present

## 2023-07-26 DIAGNOSIS — R269 Unspecified abnormalities of gait and mobility: Secondary | ICD-10-CM | POA: Diagnosis not present

## 2023-07-26 DIAGNOSIS — M21371 Foot drop, right foot: Secondary | ICD-10-CM | POA: Diagnosis not present

## 2023-07-28 ENCOUNTER — Other Ambulatory Visit: Payer: Self-pay | Admitting: Endocrinology

## 2023-07-28 DIAGNOSIS — M810 Age-related osteoporosis without current pathological fracture: Secondary | ICD-10-CM

## 2023-07-29 DIAGNOSIS — M25512 Pain in left shoulder: Secondary | ICD-10-CM | POA: Diagnosis not present

## 2023-07-29 DIAGNOSIS — R269 Unspecified abnormalities of gait and mobility: Secondary | ICD-10-CM | POA: Diagnosis not present

## 2023-07-29 DIAGNOSIS — M21371 Foot drop, right foot: Secondary | ICD-10-CM | POA: Diagnosis not present

## 2023-08-01 DIAGNOSIS — M25512 Pain in left shoulder: Secondary | ICD-10-CM | POA: Diagnosis not present

## 2023-08-01 DIAGNOSIS — R269 Unspecified abnormalities of gait and mobility: Secondary | ICD-10-CM | POA: Diagnosis not present

## 2023-08-01 DIAGNOSIS — M21371 Foot drop, right foot: Secondary | ICD-10-CM | POA: Diagnosis not present

## 2023-08-04 ENCOUNTER — Ambulatory Visit
Admission: RE | Admit: 2023-08-04 | Discharge: 2023-08-04 | Disposition: A | Discharge status: Home / Self Care | Payer: BC Managed Care – PPO | Source: Ambulatory Visit | Attending: Diagnostic Neuroimaging | Admitting: Diagnostic Neuroimaging

## 2023-08-04 DIAGNOSIS — G629 Polyneuropathy, unspecified: Secondary | ICD-10-CM | POA: Diagnosis not present

## 2023-08-04 DIAGNOSIS — S42212D Unspecified displaced fracture of surgical neck of left humerus, subsequent encounter for fracture with routine healing: Secondary | ICD-10-CM | POA: Diagnosis not present

## 2023-08-04 MED ORDER — GADOPICLENOL 0.5 MMOL/ML IV SOLN
6.0000 mL | Freq: Once | INTRAVENOUS | Status: AC | PRN
Start: 2023-08-04 — End: 2023-08-04
  Administered 2023-08-04: 6 mL via INTRAVENOUS

## 2023-08-05 DIAGNOSIS — M21371 Foot drop, right foot: Secondary | ICD-10-CM | POA: Diagnosis not present

## 2023-08-05 DIAGNOSIS — M25512 Pain in left shoulder: Secondary | ICD-10-CM | POA: Diagnosis not present

## 2023-08-05 DIAGNOSIS — R269 Unspecified abnormalities of gait and mobility: Secondary | ICD-10-CM | POA: Diagnosis not present

## 2023-08-08 DIAGNOSIS — M25512 Pain in left shoulder: Secondary | ICD-10-CM | POA: Diagnosis not present

## 2023-08-08 DIAGNOSIS — M21371 Foot drop, right foot: Secondary | ICD-10-CM | POA: Diagnosis not present

## 2023-08-08 DIAGNOSIS — R269 Unspecified abnormalities of gait and mobility: Secondary | ICD-10-CM | POA: Diagnosis not present

## 2023-08-09 ENCOUNTER — Ambulatory Visit: Payer: BC Managed Care – PPO | Admitting: Diagnostic Neuroimaging

## 2023-08-09 NOTE — Discharge Instructions (Signed)

## 2023-08-10 ENCOUNTER — Ambulatory Visit
Admission: RE | Admit: 2023-08-10 | Discharge: 2023-08-10 | Disposition: A | Discharge status: Home / Self Care | Payer: BC Managed Care – PPO | Source: Ambulatory Visit | Attending: Diagnostic Neuroimaging | Admitting: Diagnostic Neuroimaging

## 2023-08-10 ENCOUNTER — Other Ambulatory Visit (HOSPITAL_COMMUNITY)
Admission: RE | Admit: 2023-08-10 | Discharge: 2023-08-10 | Disposition: A | Discharge status: Home / Self Care | Payer: BC Managed Care – PPO | Source: Ambulatory Visit | Attending: Diagnostic Neuroimaging | Admitting: Diagnostic Neuroimaging

## 2023-08-10 VITALS — BP 138/84 | HR 82

## 2023-08-10 DIAGNOSIS — R269 Unspecified abnormalities of gait and mobility: Secondary | ICD-10-CM | POA: Diagnosis not present

## 2023-08-10 DIAGNOSIS — G629 Polyneuropathy, unspecified: Secondary | ICD-10-CM | POA: Diagnosis not present

## 2023-08-10 DIAGNOSIS — R531 Weakness: Secondary | ICD-10-CM | POA: Diagnosis not present

## 2023-08-10 NOTE — Progress Notes (Signed)
 1 vial of blood drawn from pts Right AC to be sent off with LP lab work. 1 successful attempt, pt tolerated well. Gauze and tape applied after.

## 2023-08-11 LAB — CYTOLOGY - NON PAP

## 2023-08-12 DIAGNOSIS — M25512 Pain in left shoulder: Secondary | ICD-10-CM | POA: Diagnosis not present

## 2023-08-12 DIAGNOSIS — M21371 Foot drop, right foot: Secondary | ICD-10-CM | POA: Diagnosis not present

## 2023-08-12 DIAGNOSIS — R269 Unspecified abnormalities of gait and mobility: Secondary | ICD-10-CM | POA: Diagnosis not present

## 2023-08-15 DIAGNOSIS — M25512 Pain in left shoulder: Secondary | ICD-10-CM | POA: Diagnosis not present

## 2023-08-15 DIAGNOSIS — M21371 Foot drop, right foot: Secondary | ICD-10-CM | POA: Diagnosis not present

## 2023-08-15 DIAGNOSIS — R269 Unspecified abnormalities of gait and mobility: Secondary | ICD-10-CM | POA: Diagnosis not present

## 2023-08-16 LAB — CSF CULTURE W GRAM STAIN
MICRO NUMBER:: 16131639
Result:: NO GROWTH
SPECIMEN QUALITY:: ADEQUATE

## 2023-08-16 LAB — CSF CELL COUNT WITH DIFFERENTIAL
RBC Count, CSF: 3 {cells}/uL — ABNORMAL HIGH
TOTAL NUCLEATED CELL: 4 {cells}/uL (ref 0–5)

## 2023-08-16 LAB — OLIGOCLONAL BANDS, CSF + SERM

## 2023-08-16 LAB — PROTEIN, CSF: Total Protein, CSF: 42 mg/dL (ref 15–60)

## 2023-08-16 LAB — GLUCOSE, CSF: Glucose, CSF: 64 mg/dL (ref 40–80)

## 2023-08-17 DIAGNOSIS — M21371 Foot drop, right foot: Secondary | ICD-10-CM | POA: Diagnosis not present

## 2023-08-17 DIAGNOSIS — R269 Unspecified abnormalities of gait and mobility: Secondary | ICD-10-CM | POA: Diagnosis not present

## 2023-08-17 DIAGNOSIS — M25512 Pain in left shoulder: Secondary | ICD-10-CM | POA: Diagnosis not present

## 2023-08-18 ENCOUNTER — Ambulatory Visit (INDEPENDENT_AMBULATORY_CARE_PROVIDER_SITE_OTHER): Payer: Medicare Other

## 2023-08-18 DIAGNOSIS — J4550 Severe persistent asthma, uncomplicated: Secondary | ICD-10-CM | POA: Diagnosis not present

## 2023-08-18 NOTE — Progress Notes (Signed)
 Results are good, no major findings on MRI brain.  -VRP

## 2023-08-18 NOTE — Progress Notes (Signed)
 Spinal fluid overall is normal, except for some oligoclonal bands. In the setting of normal brain and spinal cord, normal protein level, this is hard to interpret. Could be be a false positive result. Please setup follow up office visit next week (Wednesday afternoon) for further evaluation. -VRP

## 2023-08-22 ENCOUNTER — Telehealth: Payer: Self-pay | Admitting: Neurology

## 2023-08-22 NOTE — Telephone Encounter (Signed)
-----   Message from [facility] sent at 08/18/2023  5:35 PM EST ----- Spinal fluid overall is normal, except for some oligoclonal bands. In the setting of normal brain and spinal cord, normal protein level, this is hard to interpret. Could be be a false positive result. Please setup follow up office visit next week (Wednesday afternoon) for further evaluation. -VRP

## 2023-08-22 NOTE — Telephone Encounter (Signed)
 Called the pt and advised of the findings. Advised that overall CSF labs were normal with exception of oligoclonal bands were present. Advised the patient since MRI's have been normal he thinks this may potentially be a false positive. I have placed the patient on with Dr Marjory Lies for wed at 2 pm for them to discuss results and next steps.

## 2023-08-24 ENCOUNTER — Ambulatory Visit: Admitting: Diagnostic Neuroimaging

## 2023-08-24 ENCOUNTER — Encounter: Payer: Self-pay | Admitting: Diagnostic Neuroimaging

## 2023-08-24 ENCOUNTER — Telehealth: Payer: Self-pay | Admitting: Neurology

## 2023-08-24 VITALS — BP 142/84 | HR 111 | Ht 62.0 in | Wt 135.0 lb

## 2023-08-24 DIAGNOSIS — G629 Polyneuropathy, unspecified: Secondary | ICD-10-CM | POA: Diagnosis not present

## 2023-08-24 DIAGNOSIS — R531 Weakness: Secondary | ICD-10-CM

## 2023-08-24 DIAGNOSIS — M21371 Foot drop, right foot: Secondary | ICD-10-CM | POA: Diagnosis not present

## 2023-08-24 DIAGNOSIS — R269 Unspecified abnormalities of gait and mobility: Secondary | ICD-10-CM | POA: Diagnosis not present

## 2023-08-24 DIAGNOSIS — M25512 Pain in left shoulder: Secondary | ICD-10-CM | POA: Diagnosis not present

## 2023-08-24 NOTE — Progress Notes (Signed)
 [facility]  PATIENT: [name] DOB: Dec 17, 1955  REFERRING CLINICIAN: Deeann Saint, MD HISTORY FROM: patient and spouse REASON FOR VISIT: follow up   HISTORICAL  CHIEF COMPLAINT:  Chief Complaint  Patient presents with   Follow-up    Rm 7 with spouse  Pt is well, here to discuss MRI, LP and treatment plan.     HISTORY OF PRESENT ILLNESS:   UPDATE (08/24/23, VRP): Since last visit, doing about the same. Symptoms are stable overall.  Again reviewing symptom onset in summer 2024, lower extremity weakness in the feet and ankles reached peak within several weeks.  Symptoms quickly plateaued.  They have been fairly stable since that time.  No progression to her proximal legs, hands, arms, speech or swallowing.  Patient does have some sensory symptoms including a sensation of numbness on the bottom of the feet which started in the summer 2024 as well.  No numbness in the fingertips or hands.  She has been doing physical therapy and feels like she is getting stronger.  She is using a walker.  We reviewed extensive testing including MRI of the brain which was normal, lumbar puncture which showed normal protein and cells, but did show 2 unique oligoclonal bands in the CSF.  In terms of hyperreflexia this is persistent in the upper extremities, with trace reflexes at the ankles.  Patient does report some history of being told she has hyperactive reflexes in the past.   PRIOR HPI (07/13/23): 68 year old female here for evaluation of lower extremity weakness and gait difficulty.  December 2023 patient was walking when she was knocked over by 2 dogs jumping around.  She fractured her left fifth metatarsal.  She underwent surgery a few weeks later in January 2024.  She was in a left leg boot immobilizer for several months to recover.  In April 2024 she underwent physical therapy to improve her walking.  She improved slightly.  However by summer time she noticed that she  was starting to decline in her gait and walking.  She noticed problems mainly with her left foot and ankle stability.  In September 2024 she had left lower extremity EMG nerve conduction study which demonstrated abnormalities of left peroneal motor nerve, left tibial motor nerve, and chronic denervation affecting left vastus medialis, left gastrocnemius, left tibialis anterior muscles.  MRI of the left hip, left knee and lumbar spine were performed and unremarkable.  Then she underwent left peroneal decompression surgery in November 2024.  She had another EMG nerve conduction study which showed bilateral peroneal and bilateral tibial motor response abnormalities, absent right sural response, and active and chronic denervation in multiple muscles of the right and left lower extremities.  She underwent right peroneal decompression surgery in December 2024.  Since that time patient feels like some symptoms have slightly improved because she is no longer having to use AFOs.  However she still has significant weakness and gait abnormality.  Unfortunately on 06/14/2023 she fell again and fractured her left humerus.  She is not currently using a cane or walker.    REVIEW OF SYSTEMS: Full 14 system review of systems performed and negative with exception of: as per HPI.  ALLERGIES: Allergies  Allergen Reactions   Codeine Nausea And Vomiting, Diarrhea and Nausea Only   Nsaids Other (See Comments)    BP RISE   Pollen Extract Itching    HOME MEDICATIONS: Outpatient Medications Prior to Visit  Medication Sig Dispense Refill   Albuterol-Budesonide (AIRSUPRA)  90-80 MCG/ACT AERO Inhale 2 Inhalations into the lungs every 4 (four) hours as needed. 11 g 1   Budeson-Glycopyrrol-Formoterol (BREZTRI AEROSPHERE) 160-9-4.8 MCG/ACT AERO Inhale 2 puffs into the lungs in the morning and at bedtime. 23.1 g 1   Ergocalciferol (VITAMIN D2) 400 units TABS Take 1 tablet by mouth daily.     estradiol (EVAMIST) 1.53 MG/SPRAY  transdermal spray Place 2 sprays onto the skin daily.     pantoprazole (PROTONIX) 40 MG tablet Take 1 tablet (40 mg total) by mouth every morning. 90 tablet 1   potassium chloride SA (KLOR-CON M) 20 MEQ tablet TAKE 1 TABLET(20 MEQ) BY MOUTH DAILY 90 tablet 3   progesterone (PROMETRIUM) 100 MG capsule Take 100 mg by mouth at bedtime.     Risedronate Sodium 35 MG TBEC Take 35 mg by mouth once a week.     Spacer/Aero-Holding Chambers DEVI 1 Device by Does not apply route as directed. 1 each 1   Tezepelumab-ekko (TEZSPIRE) 210 MG/1. SOAJ Inject 210 mg into the skin every 28 (twenty-eight) days. 1.91 mL 11   triamterene-hydrochlorothiazide (DYAZIDE) 37.5-25 MG capsule Take 1 each (1 capsule total) by mouth daily. 90 capsule 3   Facility-Administered Medications Prior to Visit  Medication Dose Route Frequency Provider Last Rate Last Admin   tezepelumab-ekko (TEZSPIRE) 210 MG/1. syringe 210 mg  210 mg Subcutaneous Q28 days Jessica Priest, MD   210 mg at 08/18/23 1057    PAST MEDICAL HISTORY: Past Medical History:  Diagnosis Date   Asthma    Diverticulosis    Elevated C-reactive protein (CRP)    Esophageal stricture    GERD (gastroesophageal reflux disease)    HH (hiatus hernia)    Hypertension    MVP (mitral valve prolapse)    Osteoporosis    Renal calculus     PAST SURGICAL HISTORY: Past Surgical History:  Procedure Laterality Date   Broken Elbow Right    Broken Finger Right    Ring Finger   FOOT FRACTURE SURGERY Left 06/20/2022   FOOT SURGERY Left    nerve decompression surgery    FAMILY HISTORY: Family History  Problem Relation Age of Onset   Breast cancer Maternal Aunt        x 2   Heart disease Maternal Uncle        multiple   Heart attack Father    Anuerysm Mother        aortal   Colon cancer Neg Hx    Esophageal cancer Neg Hx    Liver cancer Neg Hx    Pancreatic cancer Neg Hx    Rectal cancer Neg Hx    Stomach cancer Neg Hx     SOCIAL HISTORY: Social  History   Socioeconomic History   Marital status: Married    Spouse name: Not on file   Number of children: 0   Years of education: Not on file   Highest education level: Not on file  Occupational History   Occupation: ATTORNEY    Employer: [employer]  Tobacco Use   Smoking status: Never   Smokeless tobacco: Never  Vaping Use   Vaping status: Never Used  Substance and Sexual Activity   Alcohol use: Yes    Comment: 3 times a week   Drug use: No   Sexual activity: Yes  Other Topics Concern   Not on file  Social History Narrative   Right handed    Wears contacts lens   Drinks de-coffee  Social Drivers of Corporate investment banker Strain: Low Risk  (06/23/2023)   Overall Financial Resource Strain (CARDIA)    Difficulty of Paying Living Expenses: Not hard at all  Food Insecurity: No Food Insecurity (06/23/2023)   Hunger Vital Sign    Worried About Running Out of Food in the Last Year: Never true    Ran Out of Food in the Last Year: Never true  Transportation Needs: No Transportation Needs (06/23/2023)   PRAPARE - Administrator, Civil Service (Medical): No    Lack of Transportation (Non-Medical): No  Physical Activity: Not on file  Stress: Not on file  Social Connections: Not on file  Intimate Partner Violence: Not on file     PHYSICAL EXAM  GENERAL EXAM/CONSTITUTIONAL: Vitals:  Vitals:   08/24/23 1346  BP: (!) 142/84  Pulse: (!) 111  Weight: 135 lb (61.2 kg)  Height: 5\' 2"  (1.575 m)   Body mass index is 24.69 kg/m. Wt Readings from Last 3 Encounters:  08/24/23 135 lb (61.2 kg)  07/13/23 135 lb (61.2 kg)  06/29/23 137 lb 6.4 oz (62.3 kg)   Patient is in no distress; well developed, nourished and groomed; neck is supple  CARDIOVASCULAR: Examination of carotid arteries is normal; no carotid bruits Regular rate and rhythm, no murmurs Examination of peripheral vascular system by observation and palpation is normal  EYES: Ophthalmoscopic  exam of optic discs and posterior segments is normal; no papilledema or hemorrhages No results found.  MUSCULOSKELETAL: Gait, strength, tone, movements noted in Neurologic exam below  NEUROLOGIC: MENTAL STATUS:      No data to display         awake, alert, oriented to person, place and time recent and remote memory intact normal attention and concentration language fluent, comprehension intact, naming intact fund of knowledge appropriate  CRANIAL NERVE:  2nd - no papilledema on fundoscopic exam 2nd, 3rd, 4th, 6th - pupils equal and reactive to light, visual fields full to confrontation, extraocular muscles intact, no nystagmus 5th - facial sensation symmetric 7th - facial strength symmetric 8th - hearing intact 9th - palate elevates symmetrically, uvula midline 11th - shoulder shrug symmetric 12th - tongue protrusion midline  MOTOR:  normal bulk and tone, full strength in the BUE BLE (HF 4, KE / KF 5; DF 3)  SENSORY:  normal and symmetric to light touch, temperature, vibration (right foot 8 sec, left foot 7 sec)  COORDINATION:  finger-nose-finger, fine finger movements normal  REFLEXES:  deep tendon reflexes --> BUE 3, KNEES 3; ANKLES 1; POSITIVE HOFFMANS BILATERALLY; POSITIVE SUPRA-PATELLAR BILATERALLY  GAIT/STATION:  BILATERAL STEPPAGE GAIT; USING WALKER     DIAGNOSTIC DATA (LABS, IMAGING, TESTING) - I reviewed patient records, labs, notes, testing and imaging myself where available.  Lab Results  Component Value Date   WBC 7.2 03/01/2023   HGB 13.2 03/01/2023   HCT 42.9 03/01/2023   MCV 84 03/01/2023   PLT 308 03/01/2023      Component Value Date/Time   NA 139 01/30/2018 0959   NA 139 04/26/2017 0911   K 3.9 02/24/2018 0858   CL 104 01/30/2018 0959   CO2 28 01/30/2018 0959   GLUCOSE 98 01/30/2018 0959   BUN 16 01/30/2018 0959   BUN 14 04/26/2017 0911   CREATININE 0.87 01/30/2018 0959   CALCIUM 9.8 01/30/2018 0959   PROT 7.0 07/15/2023 0951    ALBUMIN 3.8 10/15/2010 1024   AST 18 10/15/2010 1024   ALT 18 10/15/2010  1024   ALKPHOS 52 10/15/2010 1024   BILITOT 0.4 10/15/2010 1024   GFRNONAA 72 04/26/2017 0911   GFRAA 83 04/26/2017 0911   Lab Results  Component Value Date   CHOL 234 (H) 10/15/2010   HDL 89.30 10/15/2010   LDLCALC 117 (H) 10/21/2008   LDLDIRECT 133.1 10/15/2010   TRIG 47.0 10/15/2010   CHOLHDL 3 10/15/2010   No results found for: "HGBA1C" Lab Results  Component Value Date   VITAMINB12 1,416 (H) 07/15/2023   Lab Results  Component Value Date   TSH 1.710 07/15/2023   07/15/23 labs Component Ref Range & Units (hover) 1 mo ago  GM1 IgG Autoantibodies <20   Component Ref Range & Units (hover) 1 mo ago  Total CK 206 High    Component Ref Range & Units (hover) 1 mo ago  Aldolase 5.9   Component Ref Range & Units (hover) 1 mo ago  ANA Titer 1 Negative   Component Ref Range & Units (hover) 1 mo ago 12 yr ago 13 yr ago 14 yr ago  IgG (Immunoglobin G), Serum 927     IgA/Immunoglobulin A, Serum 200     IgM (Immunoglobulin M), Srm 101     Total Protein 7.0 6.6 R 6.4 R 6.4 R  Albumin SerPl Elph-Mcnc 3.7     Alpha 1 0.3     Alpha2 Glob SerPl Elph-Mcnc 1.0     B-Globulin SerPl Elph-Mcnc 1.2     Gamma Glob SerPl Elph-Mcnc 0.8     M Protein SerPl Elph-Mcnc Not Observed     Globulin, Total 3.3     Albumin/Glob SerPl 1.2        08/10/23 CSF studies  RBC Count, CSF 3 High   TOTAL NUCLEATED CELL 4      Component Ref Range & Units (hover) 2 wk ago  Total Protein, CSF 42   Component Ref Range & Units (hover) 2 wk ago  Glucose, CSF 64    Gram stain prepared by cytospin No organisms or white blood cells seen   Component Ref Range & Units (hover) 2 wk ago  Oligo Bands Present Abnormal   Comment: . The patient's CSF contains TWO well defined gamma restriction bands that are not present in the corresponding serum sample. These bands indicate abnormal synthesis of gamma globulins in the  central nervous system. This finding is supportive evidence of central nervous system inflammation, multiple sclerosis, or infection and should be interpreted in conjunction with all clinical and laboratory data pertaining to this patient.   CSF Cytology: - No malignant cells identified  - Scattered rare lymphocytes and monocytes    05/30/23 MRI cervical and thoracic spine 1. No acute abnormality cervical or thoracic spine. No finding to explain the patient's symptoms. 2. Mild cervical spondylosis. Mild foraminal narrowing on the left at C3-4 and bilaterally at C4-5. 3. Chronic compression fractures of T5 and T7. Minimal marrow edema anteriorly in T7 could be due to a recent component of the fracture. 4. Hiatal hernia.  08/04/23 MRI brain - Unremarkable MRI scan of the brain with and without contrast  07/14/23 EMG/NCS Abnormal study demonstrating: -Axonal, length-dependent sensorimotor polyneuropathy.  Motor responses are more significantly affected than sensory responses.  Subtle demyelinating features noted with temporal dispersion of right tibial motor response and significantly prolonged left tibial F-wave latency of 76 m/s. -Chronic denervation noted in right upper extremity, and active and chronic denervation noted in the right lower extremity.  - Fasciculations also noted in right  vastus medialis, right gastrocnemius, right biceps, right triceps, left lumbar paraspinal muscles.      ASSESSMENT AND PLAN  68 y.o. 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  Patient also has hyperreflexia in the upper and lower extremities, fasciculations in the right upper and lower extremities, and some subtle demyelinating features on the nerve conduction study.  Mixed picture of findings and clinical course, raise possibility of incomplete recovery of  AIDP (Guillain-Barr syndrome), CIDP variant, or motor neuron disease.   Dx:  1. Neuropathy   2. Weakness     PLAN:  LOWER EXTREMITY WEAKNESS / POLYNEUROPATHY / HYPERREFLEXIA / FASCICULATIONS (mixed picture of findings and clinical course, raise possibility of incomplete recovery of AIDP (Guillain-Barr syndrome), CIDP variant, or motor neuron disease)  - check autoimmune ab panel; repeat CK, aldolase - check hattr amyloidosis gene testing - use AFOs, cane / walker for now; fall precautions reviewed  HYPERREFLEXIA (symmetric; unclear cause based on MRI of the brain, cervical, thoracic spine; some concern for ALS motor neuron disease, but symptoms overall are stable) - referral to [facility] neuromuscular clinic second opinion  Orders Placed This Encounter  Procedures   Autoimmune Neurology Ab   CK   Aldolase   Return in about 4 months (around 12/24/2023) for MyChart visit (15 min).  I spent 60 minutes of face-to-face and non-face-to-face time with patient.  This included previsit chart review, lab review, study review, order entry, electronic health record documentation, patient education.     Suanne Marker, MD 08/24/2023, 3:06 PM Certified in Neurology, Neurophysiology and Neuroimaging  [facility] [address], [address] [place], [place] [address] [phone]

## 2023-08-24 NOTE — Telephone Encounter (Signed)
 Dr Marjory Lies wanted the patient to complete the genetic testing kit through [facility]. Completed the buccal swab in the office. I have completed the paperwork and placed the sample and paperwork in box to be sent via [employer].  Called Feb ex-[phone] and scheduled a pick up for express pick up using the Tracking # on the prelabled package is [phone] And the confirmation # of scheduled pick up is [id number]

## 2023-08-25 ENCOUNTER — Telehealth: Payer: Self-pay | Admitting: Diagnostic Neuroimaging

## 2023-08-25 NOTE — Telephone Encounter (Signed)
 Neurology referral faxed to [facility] (fax# [phone], phone# [phone])

## 2023-08-26 DIAGNOSIS — M21371 Foot drop, right foot: Secondary | ICD-10-CM | POA: Diagnosis not present

## 2023-08-26 DIAGNOSIS — R269 Unspecified abnormalities of gait and mobility: Secondary | ICD-10-CM | POA: Diagnosis not present

## 2023-08-26 DIAGNOSIS — M25512 Pain in left shoulder: Secondary | ICD-10-CM | POA: Diagnosis not present

## 2023-08-30 NOTE — Telephone Encounter (Signed)
 Received the fax with the results for the pt

## 2023-08-31 DIAGNOSIS — R269 Unspecified abnormalities of gait and mobility: Secondary | ICD-10-CM | POA: Diagnosis not present

## 2023-08-31 DIAGNOSIS — M21371 Foot drop, right foot: Secondary | ICD-10-CM | POA: Diagnosis not present

## 2023-08-31 DIAGNOSIS — M25512 Pain in left shoulder: Secondary | ICD-10-CM | POA: Diagnosis not present

## 2023-09-01 DIAGNOSIS — S42212D Unspecified displaced fracture of surgical neck of left humerus, subsequent encounter for fracture with routine healing: Secondary | ICD-10-CM | POA: Diagnosis not present

## 2023-09-05 DIAGNOSIS — R269 Unspecified abnormalities of gait and mobility: Secondary | ICD-10-CM | POA: Diagnosis not present

## 2023-09-05 DIAGNOSIS — M21371 Foot drop, right foot: Secondary | ICD-10-CM | POA: Diagnosis not present

## 2023-09-05 DIAGNOSIS — M25512 Pain in left shoulder: Secondary | ICD-10-CM | POA: Diagnosis not present

## 2023-09-06 LAB — AUTOIMMUNE NEUROLOGY AB
AGNA-1: NEGATIVE
AMPA-R1 Antibody: NEGATIVE
AMPA-R2 Antibody: NEGATIVE
Amphiphysin Antibody: NEGATIVE
Anti-Hu Ab: NEGATIVE
Anti-Ri Ab: NEGATIVE
Anti-Yo Ab: NEGATIVE
Antineruonal nuclear Ab Type 3: NEGATIVE
Aquaporin 4 Antibody: NEGATIVE
CASPR2 Antibody,Cell-based IFA: NEGATIVE
CRMP-5 IgG: NEGATIVE
DNER Antibody: NEGATIVE
DPPX Antibody: NEGATIVE
GABA-B-R Antibody: NEGATIVE
GAD65 Antibody: NEGATIVE
ITPR1 Antibody: NEGATIVE
IgLON5 Antibody: NEGATIVE
Interpretation: NEGATIVE
LGI1 Antibody, Cell-based IFA: NEGATIVE
Ma2/Ta Antibody: NEGATIVE
NMDA-R Antibody: NEGATIVE
Purkinje Cell Cyto Ab Type 2: NEGATIVE
Purkinje Cell Cyto Ab Type Tr: NEGATIVE
VGCC Antibody: 1.5 pmol/L (ref 0.0–30.0)
Zic4 Antibody: NEGATIVE
mGluR1 Antibody: NEGATIVE

## 2023-09-06 LAB — ALDOLASE: Aldolase: 6.5 U/L (ref 3.3–10.3)

## 2023-09-06 LAB — CK: Total CK: 229 U/L — ABNORMAL HIGH (ref 32–182)

## 2023-09-07 DIAGNOSIS — M25512 Pain in left shoulder: Secondary | ICD-10-CM | POA: Diagnosis not present

## 2023-09-07 DIAGNOSIS — R269 Unspecified abnormalities of gait and mobility: Secondary | ICD-10-CM | POA: Diagnosis not present

## 2023-09-07 DIAGNOSIS — M21371 Foot drop, right foot: Secondary | ICD-10-CM | POA: Diagnosis not present

## 2023-09-08 ENCOUNTER — Other Ambulatory Visit (INDEPENDENT_AMBULATORY_CARE_PROVIDER_SITE_OTHER): Payer: Self-pay

## 2023-09-08 ENCOUNTER — Telehealth: Payer: Self-pay | Admitting: Diagnostic Neuroimaging

## 2023-09-08 ENCOUNTER — Other Ambulatory Visit: Payer: Self-pay | Admitting: Neurology

## 2023-09-08 DIAGNOSIS — R531 Weakness: Secondary | ICD-10-CM

## 2023-09-08 DIAGNOSIS — Z0289 Encounter for other administrative examinations: Secondary | ICD-10-CM

## 2023-09-08 DIAGNOSIS — G629 Polyneuropathy, unspecified: Secondary | ICD-10-CM | POA: Diagnosis not present

## 2023-09-08 NOTE — Addendum Note (Signed)
 Addended by: Joycelyn Schmid R on: 09/08/2023 09:24 AM   Modules accepted: Orders

## 2023-09-08 NOTE — Telephone Encounter (Signed)
 [place] Numbers: [id number] exp. 09/08/23-10/07/23 sent to GI [phone]

## 2023-09-08 NOTE — Progress Notes (Signed)
 I called patient. CK slightly elevated, and aldolase is normal. Non-specific but consistent with some mild muscle irritation / inflamm. Autoimmune panel is negative, except very mild VGCC antibody elevation (has been described in association with paraneoplastic myeloneuropathy, but not clear cut; do not suspect lambert eaton syndrome). Recommend CT chest, abdomen, pelvis screening. Also will check serum copper, ceruloplasmin and zinc levels.  -VRP

## 2023-09-12 LAB — ZINC: Zinc: 60 ug/dL (ref 44–115)

## 2023-09-12 LAB — CERULOPLASMIN: Ceruloplasmin: 34.5 mg/dL (ref 19.0–39.0)

## 2023-09-12 LAB — COPPER, SERUM: Copper: 130 ug/dL (ref 80–158)

## 2023-09-14 DIAGNOSIS — R269 Unspecified abnormalities of gait and mobility: Secondary | ICD-10-CM | POA: Diagnosis not present

## 2023-09-14 DIAGNOSIS — M25512 Pain in left shoulder: Secondary | ICD-10-CM | POA: Diagnosis not present

## 2023-09-14 DIAGNOSIS — M21371 Foot drop, right foot: Secondary | ICD-10-CM | POA: Diagnosis not present

## 2023-09-15 ENCOUNTER — Ambulatory Visit

## 2023-09-16 ENCOUNTER — Encounter: Payer: Self-pay | Admitting: Diagnostic Neuroimaging

## 2023-09-16 DIAGNOSIS — M21371 Foot drop, right foot: Secondary | ICD-10-CM | POA: Diagnosis not present

## 2023-09-16 DIAGNOSIS — R269 Unspecified abnormalities of gait and mobility: Secondary | ICD-10-CM | POA: Diagnosis not present

## 2023-09-16 DIAGNOSIS — M25512 Pain in left shoulder: Secondary | ICD-10-CM | POA: Diagnosis not present

## 2023-09-19 DIAGNOSIS — R269 Unspecified abnormalities of gait and mobility: Secondary | ICD-10-CM | POA: Diagnosis not present

## 2023-09-19 DIAGNOSIS — M21371 Foot drop, right foot: Secondary | ICD-10-CM | POA: Diagnosis not present

## 2023-09-19 DIAGNOSIS — M25512 Pain in left shoulder: Secondary | ICD-10-CM | POA: Diagnosis not present

## 2023-09-20 ENCOUNTER — Encounter: Payer: Self-pay | Admitting: Allergy and Immunology

## 2023-09-20 ENCOUNTER — Ambulatory Visit: Payer: Medicare Other

## 2023-09-20 ENCOUNTER — Ambulatory Visit: Payer: BC Managed Care – PPO | Admitting: Allergy and Immunology

## 2023-09-20 VITALS — BP 126/86 | HR 91 | Temp 98.2°F | Resp 16 | Ht 62.0 in | Wt 130.0 lb

## 2023-09-20 DIAGNOSIS — K219 Gastro-esophageal reflux disease without esophagitis: Secondary | ICD-10-CM

## 2023-09-20 DIAGNOSIS — J4550 Severe persistent asthma, uncomplicated: Secondary | ICD-10-CM

## 2023-09-20 DIAGNOSIS — J3089 Other allergic rhinitis: Secondary | ICD-10-CM | POA: Diagnosis not present

## 2023-09-20 MED ORDER — SPACER/AERO-HOLDING CHAMBERS DEVI: 1.0000 | 1 refills | Status: AC

## 2023-09-20 MED ORDER — PANTOPRAZOLE SODIUM 40 MG PO TBEC: 40.0000 mg | DELAYED_RELEASE_TABLET | Freq: Every morning | ORAL | 1 refills | Status: DC

## 2023-09-20 MED ORDER — BREZTRI AEROSPHERE 160-9-4.8 MCG/ACT IN AERO: 2.0000 | INHALATION_SPRAY | Freq: Two times a day (BID) | RESPIRATORY_TRACT | 1 refills | Status: DC

## 2023-09-20 MED ORDER — AIRSUPRA 90-80 MCG/ACT IN AERO: 2.0000 | INHALATION_SPRAY | RESPIRATORY_TRACT | 1 refills | Status: DC | PRN

## 2023-09-20 NOTE — Patient Instructions (Addendum)
  1.  Continue to treat inflammation of airway:  A. Breztri - 2 inhalations 3-7 times per week with spacer B. Tezepelumab injections every 4 weeks  2.  Continue to treat reflux induced respiratory tract inflammation:   A. Pantoprazole 40 mg - 1 tablet 3-7 times per week  3.  If Needed: AIRSUPRA - 2 inhalations every 6 hours  4.  Return to clinic in 6 months or earlier if needed.    5. Influenza = Tamiflu. Covid = Paxlovid

## 2023-09-20 NOTE — Progress Notes (Unsigned)
 [place] [facility] [place] [facility] [place] [facility] [place] -    Follow-up Note  Referring Provider: Deeann Saint, MD Primary Provider: Deeann Saint, MD Date of Office Visit: 09/20/2023  Subjective:   Angela [name] [facility] (DOB: [birth year]) is a 68 y.o. female who [facility] in re-evaluation of the following:  HPI: Angela [name] returns to this clinic in evaluation of severe asthma, allergic rhinitis, reflux.  I last saw her in this clinic 03 May 2023.  She has had very little issues with her airway and has had no need to use Airsupra while she continues on Breztri currently just 3 times per week and her anti-TSLP antibody every 4 weeks.  Because of a logistical issue she has discontinued her immunotherapy.  She rarely has any issues with her upper airway.  Her reflux is under very good control while using pantoprazole about 3 times per week.  She has had some musculoskeletal issues and neurological issues that have developed since I have seen her in this clinic.  She has a neuropathy that required peroneal entrapment release although her response to that therapy has not been 100% and she still has some issues with ambulation and must use a walker.  In addition, she had a fall and broke her left arm and she is currently undergoing physical therapy for that issue.  She is seeing a neurologist regarding her neuropathy and it does not appear as though a identifiable etiologic agent is present giving rise to this condition.  Allergies as of 09/20/2023       Reactions   Codeine Nausea And Vomiting, Diarrhea, Nausea Only   Nsaids Other (See Comments)   BP RISE   Pollen Extract Itching        Medication List    Airsupra 90-80 MCG/ACT Aero Generic drug: Albuterol-Budesonide Inhale 2 Inhalations into the lungs every 4 (four) hours as needed.   B-12 100 MCG Tabs Take 1 tablet by mouth daily.   Breztri Aerosphere 160-9-4.8 MCG/ACT Aero  inhaler Generic drug: budeson-glycopyrrolate-formoterol Inhale 2 puffs into the lungs in the morning and at bedtime.   estradiol 1.53 MG/SPRAY transdermal spray Commonly known as: EVAMIST Place 2 sprays onto the skin daily.   pantoprazole 40 MG tablet Commonly known as: PROTONIX Take 1 tablet (40 mg total) by mouth every morning.   potassium chloride SA 20 MEQ tablet Commonly known as: KLOR-CON M TAKE 1 TABLET(20 MEQ) BY MOUTH DAILY   progesterone 100 MG capsule Commonly known as: PROMETRIUM Take 100 mg by mouth at bedtime.   Risedronate Sodium 35 MG Tbec Take 35 mg by mouth once a week.   Spacer/Aero-Holding [employer] 1 Device by Does not apply route as directed.   Tezspire 210 MG/1. Soaj Generic drug: Tezepelumab-ekko Inject 210 mg into the skin every 28 (twenty-eight) days.   triamterene-hydrochlorothiazide 37.5-25 MG capsule Commonly known as: DYAZIDE Take 1 each (1 capsule total) by mouth daily.   Vitamin D2 10 MCG (400 UNIT) Tabs Take 1 tablet by mouth daily.    Past Medical History:  Diagnosis Date   Asthma    Diverticulosis    Elevated C-reactive protein (CRP)    Esophageal stricture    GERD (gastroesophageal reflux disease)    HH (hiatus hernia)    Hypertension    MVP (mitral valve prolapse)    Osteoporosis    Renal calculus     Past Surgical History:  Procedure Laterality Date   Broken Elbow  Right    Broken Finger Right    Ring Finger   FOOT FRACTURE SURGERY Left 06/20/2022   FOOT SURGERY Left 05/2023   nerve decompression surgery    Review of systems negative except as noted in HPI / PMHx or noted below:  Review of Systems  Constitutional: Negative.   HENT: Negative.    Eyes: Negative.   Respiratory: Negative.    Cardiovascular: Negative.   Gastrointestinal: Negative.   Genitourinary: Negative.   Musculoskeletal: Negative.   Skin: Negative.   Neurological: Negative.   Endo/Heme/Allergies: Negative.    Psychiatric/Behavioral: Negative.       Objective:   Vitals:   09/20/23 0846  BP: 126/86  Pulse: 91  Resp: 16  Temp: 98.2 F (36.8 C)  SpO2: 98%   Height: 5\' 2"  (157.5 cm)  Weight: 130 lb (59 kg)   Physical Exam Constitutional:      Appearance: She is not diaphoretic.  HENT:     Head: Normocephalic.     Right Ear: Tympanic membrane, ear canal and external ear normal.     Left Ear: Tympanic membrane, ear canal and external ear normal.     Nose: Nose normal. No mucosal edema or rhinorrhea.     Mouth/Throat:     Pharynx: Uvula midline. No oropharyngeal exudate.  Eyes:     Conjunctiva/sclera: Conjunctivae normal.  Neck:     Thyroid: No thyromegaly.     Trachea: Trachea normal. No tracheal tenderness or tracheal deviation.  Cardiovascular:     Rate and Rhythm: Normal rate and regular rhythm.     Heart sounds: Normal heart sounds, S1 normal and S2 normal. No murmur heard. Pulmonary:     Effort: No respiratory distress.     Breath sounds: Normal breath sounds. No stridor. No wheezing or rales.  Lymphadenopathy:     Head:     Right side of head: No tonsillar adenopathy.     Left side of head: No tonsillar adenopathy.     Cervical: No cervical adenopathy.  Skin:    Findings: No erythema or rash.     Nails: There is no clubbing.  Neurological:     Mental Status: She is alert.     Diagnostics: Spirometry was performed and demonstrated an FEV1 of 1.51 at 72 % of predicted.   Assessment and Plan:   1. Severe persistent asthma without complication   2. Other allergic rhinitis   3. LPRD (laryngopharyngeal reflux disease)    1.  Continue to treat inflammation of airway:  A. Breztri - 2 inhalations 3-7 times per week with spacer B. Tezepelumab injections every 4 weeks  2.  Continue to treat reflux induced respiratory tract inflammation:   A. Pantoprazole 40 mg - 1 tablet 3-7 times per week  3.  If Needed: AIRSUPRA - 2 inhalations every 6 hours  4.  Return to  clinic in 6 months or earlier if needed.    5. Influenza = Tamiflu. Covid = Paxlovid  Angela [name] appears to be doing very well from a airway standpoint and a reflux standpoint on her current therapy which includes anti-inflammatory agents for her airway with the use of both Breztri and anti-TSLP antibody and continues to use a proton pump inhibitor just a few times per week for her reflux.  Assuming she does well with this plan I will see her back in this clinic in 6 months or earlier if there is a problem.  Laurette Schimke, MD Allergy / Immunology [facility]

## 2023-09-21 ENCOUNTER — Encounter: Payer: Self-pay | Admitting: Allergy and Immunology

## 2023-09-22 ENCOUNTER — Encounter: Payer: Self-pay | Admitting: Radiology

## 2023-09-22 ENCOUNTER — Ambulatory Visit
Admission: RE | Admit: 2023-09-22 | Discharge: 2023-09-22 | Disposition: A | Discharge status: Home / Self Care | Source: Ambulatory Visit | Attending: Diagnostic Neuroimaging | Admitting: Diagnostic Neuroimaging

## 2023-09-22 DIAGNOSIS — K7689 Other specified diseases of liver: Secondary | ICD-10-CM | POA: Diagnosis not present

## 2023-09-22 DIAGNOSIS — G629 Polyneuropathy, unspecified: Secondary | ICD-10-CM | POA: Diagnosis not present

## 2023-09-22 DIAGNOSIS — R531 Weakness: Secondary | ICD-10-CM

## 2023-09-22 DIAGNOSIS — K5730 Diverticulosis of large intestine without perforation or abscess without bleeding: Secondary | ICD-10-CM | POA: Diagnosis not present

## 2023-09-22 MED ORDER — IOPAMIDOL (ISOVUE-300) INJECTION 61%
100.0000 mL | Freq: Once | INTRAVENOUS | Status: AC | PRN
  Administered 2023-09-22: 100 mL via INTRAVENOUS

## 2023-09-23 ENCOUNTER — Encounter: Payer: Self-pay | Admitting: Diagnostic Neuroimaging

## 2023-09-26 DIAGNOSIS — R269 Unspecified abnormalities of gait and mobility: Secondary | ICD-10-CM | POA: Diagnosis not present

## 2023-09-26 DIAGNOSIS — M25512 Pain in left shoulder: Secondary | ICD-10-CM | POA: Diagnosis not present

## 2023-09-26 DIAGNOSIS — M21371 Foot drop, right foot: Secondary | ICD-10-CM | POA: Diagnosis not present

## 2023-09-30 DIAGNOSIS — R269 Unspecified abnormalities of gait and mobility: Secondary | ICD-10-CM | POA: Diagnosis not present

## 2023-09-30 DIAGNOSIS — M21371 Foot drop, right foot: Secondary | ICD-10-CM | POA: Diagnosis not present

## 2023-09-30 DIAGNOSIS — M25512 Pain in left shoulder: Secondary | ICD-10-CM | POA: Diagnosis not present

## 2023-10-04 DIAGNOSIS — R269 Unspecified abnormalities of gait and mobility: Secondary | ICD-10-CM | POA: Diagnosis not present

## 2023-10-04 DIAGNOSIS — M21371 Foot drop, right foot: Secondary | ICD-10-CM | POA: Diagnosis not present

## 2023-10-04 DIAGNOSIS — M25512 Pain in left shoulder: Secondary | ICD-10-CM | POA: Diagnosis not present

## 2023-10-18 ENCOUNTER — Ambulatory Visit (INDEPENDENT_AMBULATORY_CARE_PROVIDER_SITE_OTHER)

## 2023-10-18 ENCOUNTER — Ambulatory Visit: Payer: BC Managed Care – PPO | Admitting: Allergy and Immunology

## 2023-10-18 DIAGNOSIS — M21371 Foot drop, right foot: Secondary | ICD-10-CM | POA: Diagnosis not present

## 2023-10-18 DIAGNOSIS — R269 Unspecified abnormalities of gait and mobility: Secondary | ICD-10-CM | POA: Diagnosis not present

## 2023-10-18 DIAGNOSIS — M25512 Pain in left shoulder: Secondary | ICD-10-CM | POA: Diagnosis not present

## 2023-10-18 DIAGNOSIS — J4550 Severe persistent asthma, uncomplicated: Secondary | ICD-10-CM | POA: Diagnosis not present

## 2023-10-20 DIAGNOSIS — R269 Unspecified abnormalities of gait and mobility: Secondary | ICD-10-CM | POA: Diagnosis not present

## 2023-10-20 DIAGNOSIS — M25512 Pain in left shoulder: Secondary | ICD-10-CM | POA: Diagnosis not present

## 2023-10-20 DIAGNOSIS — M21371 Foot drop, right foot: Secondary | ICD-10-CM | POA: Diagnosis not present

## 2023-10-25 DIAGNOSIS — R269 Unspecified abnormalities of gait and mobility: Secondary | ICD-10-CM | POA: Diagnosis not present

## 2023-10-25 DIAGNOSIS — M25512 Pain in left shoulder: Secondary | ICD-10-CM | POA: Diagnosis not present

## 2023-10-25 DIAGNOSIS — M21371 Foot drop, right foot: Secondary | ICD-10-CM | POA: Diagnosis not present

## 2023-10-31 DIAGNOSIS — M21371 Foot drop, right foot: Secondary | ICD-10-CM | POA: Diagnosis not present

## 2023-10-31 DIAGNOSIS — M25512 Pain in left shoulder: Secondary | ICD-10-CM | POA: Diagnosis not present

## 2023-10-31 DIAGNOSIS — R269 Unspecified abnormalities of gait and mobility: Secondary | ICD-10-CM | POA: Diagnosis not present

## 2023-11-04 DIAGNOSIS — M25512 Pain in left shoulder: Secondary | ICD-10-CM | POA: Diagnosis not present

## 2023-11-04 DIAGNOSIS — M21371 Foot drop, right foot: Secondary | ICD-10-CM | POA: Diagnosis not present

## 2023-11-04 DIAGNOSIS — R269 Unspecified abnormalities of gait and mobility: Secondary | ICD-10-CM | POA: Diagnosis not present

## 2023-11-09 DIAGNOSIS — M21371 Foot drop, right foot: Secondary | ICD-10-CM | POA: Diagnosis not present

## 2023-11-09 DIAGNOSIS — M25512 Pain in left shoulder: Secondary | ICD-10-CM | POA: Diagnosis not present

## 2023-11-09 DIAGNOSIS — R269 Unspecified abnormalities of gait and mobility: Secondary | ICD-10-CM | POA: Diagnosis not present

## 2023-11-10 DIAGNOSIS — G1221 Amyotrophic lateral sclerosis: Secondary | ICD-10-CM | POA: Diagnosis not present

## 2023-11-16 ENCOUNTER — Ambulatory Visit

## 2023-11-16 DIAGNOSIS — J4550 Severe persistent asthma, uncomplicated: Secondary | ICD-10-CM

## 2023-11-23 DIAGNOSIS — Z7409 Other reduced mobility: Secondary | ICD-10-CM | POA: Diagnosis not present

## 2023-11-23 DIAGNOSIS — Z789 Other specified health status: Secondary | ICD-10-CM | POA: Diagnosis not present

## 2023-11-23 DIAGNOSIS — G1221 Amyotrophic lateral sclerosis: Secondary | ICD-10-CM | POA: Diagnosis not present

## 2023-11-23 DIAGNOSIS — R471 Dysarthria and anarthria: Secondary | ICD-10-CM | POA: Diagnosis not present

## 2023-11-23 DIAGNOSIS — R531 Weakness: Secondary | ICD-10-CM | POA: Diagnosis not present

## 2023-11-23 NOTE — Progress Notes (Addendum)
 [facility] ALSA Certified Center of Excellence Patient Evaluation  Referring Physician: [facility]  Primary Care Physician:  Clotilda Charlies Single, MD   Angela [name] is a 68 y.o. 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   Disease Onset: She was in her usual state of very good health until she lost her balance while playing with her dogs and broke her left foot in 12/23.  She denies any weakness or instability in the ankle prior to that event.  She was placed in a boot following the fracture but during her rehabilitation it became evident that she had weakness in her left foot that did not respond to physical therapy.  At that time, she denied any pain in the leg or sensory loss/paresthesias.  There was no RLE weakness at that time. By 7/24, her PT noted weakness that was persistent in the left foot and around that time she also noted cramps in RLE which resolved spontaneously.  In 10/24, a local neurosurgeon became concerned about left fibular neuropathy which might have been related to the boot she was wearing previously for the fracture.  Around that time, she was using foot up brace on the left foot.  She underwent fibular decompression 11/24 and thinks there might have been slight improvement so subsequently underwent right fibular decompression in 12/24.  She states that her right leg was completely asymptomatic at that time but that nerve conductions and EMG performed at the orthopedics office had shown some sort of denervation involving the right leg. On 06/14/23, she sustained a serious fall with broken left humerus. No surgery but she was laid up for a while and believe that she developed some deconditioning weakness in that interval.  By 3/25,  needed walker and by 4/25 developed a new symptom of slurred speech.  She reports this is better in the AM and worse following eating.  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  Upper extremities: No problems  Lower extremities: As above  Breathing: No problems  Swallowing: As above  Speech: Subtle slurring  Saliva: No problems  Cognition: Okay  Pseudobulbar Affect: No problems  Pain: No cramps but admits to twitching in quads, rarely UEs  Advanced Care planning:   Prior to Admission medications   Medication Sig Start Date End Date Taking? Authorizing Provider  albuterol -budesonide  (Airsupra ) 90-80 mcg/actuation HFAA Inhale 2 Inhalations. 03/03/23  Yes HISTORICAL PROVIDER, CONVERSION  Breztri  Aerosphere 160-9-4.8 mcg/actuation HFAA  03/14/21  Yes HISTORICAL PROVIDER, CONVERSION  cholecalciferol (VITAMIN D3) 1,000 unit (25 mcg) tablet Take 1,000 Units by mouth daily.   Yes HISTORICAL PROVIDER, CONVERSION  cyanocobalamin  (VITAMIN B12) 100 mcg tablet Take 100 mcg by mouth daily. 03/24/23  Yes HISTORICAL PROVIDER, CONVERSION  estradiol-progesterone 0.5-100 mg cap    Yes HISTORICAL PROVIDER, CONVERSION  Evamist 1.53 mg/spray (1.7%) spry  01/17/20  Yes HISTORICAL PROVIDER, CONVERSION  pantoprazole  (PROTONIX ) 40 mg EC tablet See Admin Instructions. 03/03/23  Yes HISTORICAL PROVIDER, CONVERSION  progesterone (PROMETRIUM) 100 mg cap capsule  01/01/20  Yes HISTORICAL PROVIDER, CONVERSION  risedronate (ATELVIA) 35 mg TbEC TAKE 35 MG BY MOUTH EVERY 7 (SEVEN) DAYS   Yes HISTORICAL PROVIDER, CONVERSION  triamterene -hydroCHLOROthiazide (MAXZIDE-25) 37.5-25 mg per tablet TAKE 1 TABLET BY MOUTH DAILY 11/05/19  Yes HISTORICAL PROVIDER, CONVERSION   UNABLE TO FIND Med Name: Vital [employer] supplement   Yes HISTORICAL PROVIDER, CONVERSION  albuterol  HFA (PROVENTIL  HFA;VENTOLIN  HFA;PROAIR  HFA) 90 mcg/actuation inhaler Inhale. 02/07/20 11/10/23  HISTORICAL PROVIDER, CONVERSION  beclomethasone dipropionate  (Qvar  RediHaler) 80 mcg/actuation Inhale. 02/07/20 11/10/23  HISTORICAL PROVIDER, CONVERSION  budesonide  (Pulmicort  Flexhaler) 180 mcg/actuation inhaler Inhale. 03/11/20 11/10/23  HISTORICAL PROVIDER, CONVERSION  fluticasone  HFA (Flovent  HFA) 110 mcg/actuation inhaler Inhale 1 puff 2 (two) times a day. 02/12/20 11/10/23  HISTORICAL PROVIDER, CONVERSION     Physical Exam: BP (!) 140/102   Pulse 93   Ht 1.575 m (5' 2)   Wt 57.2 kg (126 lb)   BMI 23.05 kg/m  Wt Readings from Last 5 Encounters:  11/23/23 57.2 kg (126 lb)  11/10/23 57.2 kg (126 lb)  03/04/23 63.2 kg (139 lb 6.4 oz)  02/12/20 68 kg (150 lb)  02/07/20 67.1 kg (148 lb)    GENERAL: - Well nourished, well hydrated, no acute distress.  - Language: Speech is slightly slowed and language is normal.  MENTAL STATUS EXAM    - Memory: Cooperative, follows commands well. Recent and remote memory is normal.  - Attention, concentration: Attention span and concentration are normal.  - Fund of knowledge: Aware of current events, vocabulary appropriate for patient age.                Summary of Previous Diagnostic Testing: Additional EMG comments:  EMG from September 2024: There was really no reading but the recommendations indicated the need to rule out a radiculopathy.  The needle exam showed chronic inactive denervation in left tibialis anterior and the gastrocnemius muscles with mild changes in the vastus medialis.  This study is consistent with L5 and S1 radiculopathies.  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  Recommends bilateral fibular nerve releases.  The data again show denervation in both fibular and tibial segments below the knee in both legs.  There was not evidence of conduction block at either fibular head looking at the waveforms.  This time the right sural is noted to be absent but the left sural was normal.  This would be consistent with a right sciatic neuropathy and a left lumbosacral radiculopathy but most likely the absent right sural was a technical error.  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  The data below motor amplitudes in the right hand with normal latencies and velocities and normal sensory responses.  This is a radiculopathy versus motor neuron disease pattern.  In the legs, motor responses are absent or very low amplitude without slowing into the demyelinating range.  Sensory responses were borderline or slightly low in amplitude.  Significant denervation is seen now in the vastus medialis with reinnervation changes noted in the right arm.  MRI w/wo: Brain 2/25 negative, C and T 12/24: negative, 10-24 Lspine without formainal stenosis  Negative blood tests for ALS mimics:IFIX, ANA, GM1, B12, CK=206, GAD, copper , autoimmune antibody panel, SPEP, zinc .  CSF: Protein  equals 42, cell count equals 4, 2 OCBs were noted that were not present in the serum  Scales: Today's ALSFRS-R Total Score:  34    11/23/2023  ALSFRS-R  ALS FRS-Total 34    Today's Forced Vital Capacity: % predicted of 80%    11/23/2023  FVC ERECT %  FVC % predicted 80    ALS Cognitive Behavioral Screen performed on :   Cognitive Score:   out of 20  Behavior Score:   out of 45  Assessment: Angela [name] is a 68 y.o.  female presenting with progressive, painless weakness beginning with a left foot drop then subsequently progressing to a similar presentation on the right  side.  She also has developed some generalized weakness affecting the arms and has a more recent complaint of slurred speech.  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  I reviewed her outside nerve conduction studies which were not completely ideal but are consistent with ALS.  We discussed the utility of repeating parts of this that do not fit the clinical pattern (a few abnormal sensory responses and a conduction block) but her clinical diagnosis does not fit with other forms of peripheral neuropathy so I think these are likely to be technical errors.  She is going to consider whether she wants to repeat the testing.  Power Wheelchair Justification  The patient was evaluated for power mobility needs.  This patient has mobility limitation due to upper and lower limb weakness that prevents accomplishing activities of daily living.  This cannot be resolved by use of a fitted cane or walker.  This patient does not have sufficient arm function to propel a wheelchair to perform mobility related activities of daily living.  This patient is too weak to use a regular controller.  This patient could benefit from use of specifically configured power wheelchair and can safely transfer to and from a power wheelchair and operate the controls.  The wheelchair needs a tilt option to relieve pressure because the patient is too weak to do this without assistance. A POV has been ruled out.  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   Plan: - Continue riluzole - Order standing order for CMP to be performed at an outside lab - Genetic testing ordered after meeting with our genetics counselor - Start Radicava, risks and benefits were reviewed - Check CMP, parathyroid hormone - Enrolled in capture observational study - RTC ALS clinic in 2-3 months   It has been a pleasure to participate in the care of this  patient.  Best Regards,  I have personally spent 45 minutes involved in face-to-face and non-face-to-face activities for this patient on the day of the visit.  Professional time spent includes the following activities, in addition to those noted in the documentation: Post clinic care coordination conference:  AT: Reviewed voice apps and the bridging voice website Genetics: Reviewed hereditary ALS and performed genetic testing at the patient's request Nutrition: Recommend next visit Speech therapy: Clinical evaluation was fine and demonstrated the boogie board for communication OT: Reviewed home in office modification, adaptive devices, bathroom modifications PT: Recommended cutting back on physical training with the PT as strengthening exercises are tiring her, recommend bilateral foot up braces, plan for 2 loaner power wheelchairs RT: Forced vital capacity equals 81% which is good for her because her asthma often causes this to be in the 70s Social  work: Advanced directives are in place and she has long-term Water engineer signed by: Lynwood KATHEE Mc, MD 11/23/2023 5:38 PM  Professor of Neurology Director, ALS Center  Section Chief, Neuromuscular Medicine [facility]

## 2023-12-14 ENCOUNTER — Ambulatory Visit

## 2023-12-14 DIAGNOSIS — J4550 Severe persistent asthma, uncomplicated: Secondary | ICD-10-CM | POA: Diagnosis not present

## 2024-01-09 DIAGNOSIS — I10 Essential (primary) hypertension: Secondary | ICD-10-CM | POA: Diagnosis not present

## 2024-01-09 DIAGNOSIS — G1221 Amyotrophic lateral sclerosis: Secondary | ICD-10-CM | POA: Diagnosis not present

## 2024-01-09 DIAGNOSIS — R54 Age-related physical debility: Secondary | ICD-10-CM | POA: Diagnosis not present

## 2024-01-09 DIAGNOSIS — K5909 Other constipation: Secondary | ICD-10-CM | POA: Diagnosis not present

## 2024-01-10 DIAGNOSIS — Z789 Other specified health status: Secondary | ICD-10-CM | POA: Diagnosis not present

## 2024-01-10 DIAGNOSIS — R531 Weakness: Secondary | ICD-10-CM | POA: Diagnosis not present

## 2024-01-10 DIAGNOSIS — Z7409 Other reduced mobility: Secondary | ICD-10-CM | POA: Diagnosis not present

## 2024-01-10 DIAGNOSIS — G1221 Amyotrophic lateral sclerosis: Secondary | ICD-10-CM | POA: Diagnosis not present

## 2024-01-10 DIAGNOSIS — R471 Dysarthria and anarthria: Secondary | ICD-10-CM | POA: Diagnosis not present

## 2024-01-11 ENCOUNTER — Ambulatory Visit

## 2024-01-11 ENCOUNTER — Telehealth: Payer: Self-pay

## 2024-01-11 NOTE — Telephone Encounter (Signed)
 Patient called and stated she is under the weather and was wondering if her nurse could pick up her Tezspire  injection and administer it for her. I spoke with Tammy and she stated that would be fine. Patient will get her Tezspire  on 01/12/24.

## 2024-01-17 DIAGNOSIS — K5909 Other constipation: Secondary | ICD-10-CM | POA: Diagnosis not present

## 2024-01-17 DIAGNOSIS — K449 Diaphragmatic hernia without obstruction or gangrene: Secondary | ICD-10-CM | POA: Diagnosis not present

## 2024-01-17 DIAGNOSIS — E7800 Pure hypercholesterolemia, unspecified: Secondary | ICD-10-CM | POA: Diagnosis not present

## 2024-01-17 DIAGNOSIS — G1221 Amyotrophic lateral sclerosis: Secondary | ICD-10-CM | POA: Diagnosis not present

## 2024-01-17 DIAGNOSIS — M25572 Pain in left ankle and joints of left foot: Secondary | ICD-10-CM | POA: Diagnosis not present

## 2024-01-17 DIAGNOSIS — K219 Gastro-esophageal reflux disease without esophagitis: Secondary | ICD-10-CM | POA: Diagnosis not present

## 2024-01-17 DIAGNOSIS — E440 Moderate protein-calorie malnutrition: Secondary | ICD-10-CM | POA: Diagnosis not present

## 2024-01-17 DIAGNOSIS — I10 Essential (primary) hypertension: Secondary | ICD-10-CM | POA: Diagnosis not present

## 2024-01-17 DIAGNOSIS — Z87442 Personal history of urinary calculi: Secondary | ICD-10-CM | POA: Diagnosis not present

## 2024-01-17 DIAGNOSIS — I351 Nonrheumatic aortic (valve) insufficiency: Secondary | ICD-10-CM | POA: Diagnosis not present

## 2024-01-17 DIAGNOSIS — J4520 Mild intermittent asthma, uncomplicated: Secondary | ICD-10-CM | POA: Diagnosis not present

## 2024-01-17 DIAGNOSIS — Z8781 Personal history of (healed) traumatic fracture: Secondary | ICD-10-CM | POA: Diagnosis not present

## 2024-01-17 DIAGNOSIS — R296 Repeated falls: Secondary | ICD-10-CM | POA: Diagnosis not present

## 2024-01-17 DIAGNOSIS — E876 Hypokalemia: Secondary | ICD-10-CM | POA: Diagnosis not present

## 2024-01-17 DIAGNOSIS — M810 Age-related osteoporosis without current pathological fracture: Secondary | ICD-10-CM | POA: Diagnosis not present

## 2024-01-17 DIAGNOSIS — Z7951 Long term (current) use of inhaled steroids: Secondary | ICD-10-CM | POA: Diagnosis not present

## 2024-01-17 DIAGNOSIS — M79672 Pain in left foot: Secondary | ICD-10-CM | POA: Diagnosis not present

## 2024-01-17 DIAGNOSIS — K222 Esophageal obstruction: Secondary | ICD-10-CM | POA: Diagnosis not present

## 2024-01-18 NOTE — Progress Notes (Signed)
 On initial evaluation this patient was able to ambulate using a RW but only at a gait speed of 0.34 m/s indicating she is at a high risk for falls and that her ambulation is not considered functional even with use of a RW. Patient medical condition is of a progressive nature and the patient is now not ambulatory and using a loaner multi-function PWC for all mobility.  Based on this patient's functional mobility status at her initial evaluation and progression of her condition, this PT is recommending that this patient obtain a group 3 multi-power seat function PWC base in order to maximize independence and safety with house hold functional mobility, sitting postural support, and repositioning for pressure redistribution to help prevent development of skin pressure injuries.    Anatomical Measurements: (in inches)  Hip Width across trochanters: 16  Seat Depth:  18  Below knee to heel:  17  Seat to inferior angle of scapula: 14  Seat to top of shoulder  19            Seat to top of head:  29            Shoulder width: 15            Chest width: 13             Mobility Base and Seating System Recommendations and Justification     The following components are medically necessary:         Mobility Base:        Quantum Stretto     Length of Need: Lifetime           Wheelchair Type: Group 3 Power Wheelchair               - Games developer                  Justification:                        Non-Ambulatory.                        Cannot functionally propel a manual wheelchair.                        Cannot functionally and safely operate a scooter/POV.                        Can Safely Operate and is willing to.                                - Tilt Base or Tilt Added: Powered tilt on power chair.    Tilt systems change seat angle orientation in relation to the ground while maintaining the seat to back and seat to legrest angles. Traditional  tilt operates in the sagittal plane while lateral and rotational tilt systems operate in the coronal or oblique planes respectively.                  Justification:                        Change position against gravitational force on head and shoulders.  Change position for pressure relief / cannot shift weight.                        Transfers.                        Rest Periods.                          Fascilitate postural control.                 - Recline: Power recline on power base.    Recline systems provide a change in seat to back angle orientation while maintaining a constant seat angle with respect to the ground.                  Justification:                        Bring to full recline for ADL care            Accommodate femur to back angle            Change of position for pressure redistribution/relief and cannot weight shift             High risk of pressure sore development or has current pressure injury                       Recline combined with tilt is needed to accomplish pressure relief                        Head/neckpositioning.                          Tilt and recline features provide the most pressure relief when used in combination. One study Guido et al., 2000) found significantly lower maximum pressure in the combined position of 25 of tilt with 110 of recline in subjects with SB. A study in subjects without impairments (Aissaoui, Lacoste, & Dansereau, 2001) showed that 45 of tilt with 120 of recline provided a 40% load reduction. A study on 2 subjects with RESNA Position on the Application of Tilt, Recline and Elevating Legrests 9 tetraplegia (Pellow, 1999) showed a trend toward interface pressure reduction with combination of 45 of tilt and 150 of recline.  - Seat elevator: Power seat elevator   Justification:    will raise and lower users in their seated position through the use of an electromechanical  lift system, without changing the seated angles or the seat's angle relative to the ground, in order to provide varying amounts of added vertical access.     A wheelchair user is more readily able to transfer in a downhill direction using a sliding board versus uphill or to a level surface. In the downhill direction gravity assists as opposed to providing additional resistance and difficulty, as in the uphill direction.       Transferring in a downward direction requires less upper extremity strain.     For individuals with limited reaching abilities, a seat elevating device is necessary for access to objects and surfaces within their home, work, school, and community, thus improving their independence and decreasing their dependence on others. Many of these objects and surfaces are necessary to complete MRADLs (e.g., hygiene, meal preparation, parenting, and shopping).  Such areas include, but are not limited to, cabinets, sinks, grocery shelves, fire alarms, light switches, medicine cabinets, stovetops, thermostats, and service counters. Improved reach can also significantly increase people's ability to perform work- and school related activities and their potential for integration and productivity.                    Mobility Base Components:                - Armrests: Adjustable height                 Justification:                        Provide Support with elbow at 90 degrees.                 - Hangers / Leg Rests: Power Elevating.    Elevating legrests allow individuals to change the angle of orientation of the legs and/or footrests relative to the seat, extending the knee. Some legrests are articulating, which means they lengthen while also extending the knee.                 Justification:                        Provide lower extremity support.                        Elevate legs during recline.                        Provide change in position for legs.                          Decrease edema               - Tires: Flat free inserts.                  Justification:                        Decrease Maintenance.                        Prevent Frequent Flats.           Power Wheelchair Components:                - Expandable Harness:                  Justification:                        Necessary to operate power seat functions through joystick.                - Upgraded Electronics                  Justification:                        Programming for accurate control.                        To operate power seat function(s) through joystick control  Progressive disease/changing condition.                - Proportional joystick:  Body Part:  Right                 Justification:                         Provides access for controlling wheelchair.                - Mount for switches or joystick--retractable Right                  Justification:                        Swing away for safe transfers.                - Attendant controlled joystick plus mount                  Justification:                       Safety when patient is fatigued.                       For long distance driving.                        For the operation of seat functions.                - Battery                  Justification:      Power motor on wheelchair.              Seating Component:                - Seat Cushion:    Roho  Quatro                  Justification              To provide an increase in the pressure distribution.                        To stabilize the pelvis.                - Headrest and multidirectional hardware to attach:                      Justification:                        To provide posterior head support, both in upright sitting and while using power seat functions.     - Foot Platform: Required to support patient's feet.   -Calf pads: Required to support patient's lower legs.              - Back  Cushion:    Permobil 3G                  Justification:                        To provide posterior/lateral trunk support  To provide midline trunk support.                - Lateral thigh support and hardware to attach:                    Justification:                       Position upper legs.                - Foot Support:                      Justification:                        To position foot and keep foot on footplate.                - Upper Extremity Support:  Full Tray -                        Justification:                        To decrease edema.                        To decrease the gravitational pull on the shoulders.                        To provide a work surface.                        To provide placement for a computer or communication device.                        To provide support to increase upper extremity function.               Supplier for equipment: [employer] with [employer]  The patient was present for the evaluation and is in agreement with the recommendations and plan.  This is to certify that I, the below signed therapist, do not have an affiliation with the DME provider, the manufacturer of the recommended equipment, or with any long term care facility.  Jair Alexander Acevedo [employer]  Physician Certification: This is to certify that the above named patient, who is under my care, requires the equipment described above.  I further certify that the equipment outlined in this plan is medically necessary.  Physician Signature: ____________________________________________  Date of Certification: _________________  Jair A Acevedo, PT

## 2024-01-19 DIAGNOSIS — E7800 Pure hypercholesterolemia, unspecified: Secondary | ICD-10-CM | POA: Diagnosis not present

## 2024-01-19 DIAGNOSIS — I351 Nonrheumatic aortic (valve) insufficiency: Secondary | ICD-10-CM | POA: Diagnosis not present

## 2024-01-19 DIAGNOSIS — K5909 Other constipation: Secondary | ICD-10-CM | POA: Diagnosis not present

## 2024-01-19 DIAGNOSIS — Z87442 Personal history of urinary calculi: Secondary | ICD-10-CM | POA: Diagnosis not present

## 2024-01-19 DIAGNOSIS — E440 Moderate protein-calorie malnutrition: Secondary | ICD-10-CM | POA: Diagnosis not present

## 2024-01-19 DIAGNOSIS — M810 Age-related osteoporosis without current pathological fracture: Secondary | ICD-10-CM | POA: Diagnosis not present

## 2024-01-19 DIAGNOSIS — R296 Repeated falls: Secondary | ICD-10-CM | POA: Diagnosis not present

## 2024-01-19 DIAGNOSIS — K219 Gastro-esophageal reflux disease without esophagitis: Secondary | ICD-10-CM | POA: Diagnosis not present

## 2024-01-19 DIAGNOSIS — Z8781 Personal history of (healed) traumatic fracture: Secondary | ICD-10-CM | POA: Diagnosis not present

## 2024-01-19 DIAGNOSIS — Z7951 Long term (current) use of inhaled steroids: Secondary | ICD-10-CM | POA: Diagnosis not present

## 2024-01-19 DIAGNOSIS — J4520 Mild intermittent asthma, uncomplicated: Secondary | ICD-10-CM | POA: Diagnosis not present

## 2024-01-19 DIAGNOSIS — K222 Esophageal obstruction: Secondary | ICD-10-CM | POA: Diagnosis not present

## 2024-01-19 DIAGNOSIS — I10 Essential (primary) hypertension: Secondary | ICD-10-CM | POA: Diagnosis not present

## 2024-01-19 DIAGNOSIS — E876 Hypokalemia: Secondary | ICD-10-CM | POA: Diagnosis not present

## 2024-01-19 DIAGNOSIS — K449 Diaphragmatic hernia without obstruction or gangrene: Secondary | ICD-10-CM | POA: Diagnosis not present

## 2024-01-19 DIAGNOSIS — G1221 Amyotrophic lateral sclerosis: Secondary | ICD-10-CM | POA: Diagnosis not present

## 2024-01-20 DIAGNOSIS — Z7951 Long term (current) use of inhaled steroids: Secondary | ICD-10-CM | POA: Diagnosis not present

## 2024-01-20 DIAGNOSIS — E876 Hypokalemia: Secondary | ICD-10-CM | POA: Diagnosis not present

## 2024-01-20 DIAGNOSIS — R296 Repeated falls: Secondary | ICD-10-CM | POA: Diagnosis not present

## 2024-01-20 DIAGNOSIS — I10 Essential (primary) hypertension: Secondary | ICD-10-CM | POA: Diagnosis not present

## 2024-01-20 DIAGNOSIS — G1221 Amyotrophic lateral sclerosis: Secondary | ICD-10-CM | POA: Diagnosis not present

## 2024-01-20 DIAGNOSIS — K449 Diaphragmatic hernia without obstruction or gangrene: Secondary | ICD-10-CM | POA: Diagnosis not present

## 2024-01-20 DIAGNOSIS — I351 Nonrheumatic aortic (valve) insufficiency: Secondary | ICD-10-CM | POA: Diagnosis not present

## 2024-01-20 DIAGNOSIS — K219 Gastro-esophageal reflux disease without esophagitis: Secondary | ICD-10-CM | POA: Diagnosis not present

## 2024-01-20 DIAGNOSIS — K5909 Other constipation: Secondary | ICD-10-CM | POA: Diagnosis not present

## 2024-01-20 DIAGNOSIS — K222 Esophageal obstruction: Secondary | ICD-10-CM | POA: Diagnosis not present

## 2024-01-20 DIAGNOSIS — E7800 Pure hypercholesterolemia, unspecified: Secondary | ICD-10-CM | POA: Diagnosis not present

## 2024-01-20 DIAGNOSIS — Z8781 Personal history of (healed) traumatic fracture: Secondary | ICD-10-CM | POA: Diagnosis not present

## 2024-01-20 DIAGNOSIS — Z87442 Personal history of urinary calculi: Secondary | ICD-10-CM | POA: Diagnosis not present

## 2024-01-20 DIAGNOSIS — J4520 Mild intermittent asthma, uncomplicated: Secondary | ICD-10-CM | POA: Diagnosis not present

## 2024-01-20 DIAGNOSIS — E440 Moderate protein-calorie malnutrition: Secondary | ICD-10-CM | POA: Diagnosis not present

## 2024-01-20 DIAGNOSIS — M810 Age-related osteoporosis without current pathological fracture: Secondary | ICD-10-CM | POA: Diagnosis not present

## 2024-01-23 DIAGNOSIS — K222 Esophageal obstruction: Secondary | ICD-10-CM | POA: Diagnosis not present

## 2024-01-23 DIAGNOSIS — J4520 Mild intermittent asthma, uncomplicated: Secondary | ICD-10-CM | POA: Diagnosis not present

## 2024-01-23 DIAGNOSIS — Z8781 Personal history of (healed) traumatic fracture: Secondary | ICD-10-CM | POA: Diagnosis not present

## 2024-01-23 DIAGNOSIS — Z7951 Long term (current) use of inhaled steroids: Secondary | ICD-10-CM | POA: Diagnosis not present

## 2024-01-23 DIAGNOSIS — E876 Hypokalemia: Secondary | ICD-10-CM | POA: Diagnosis not present

## 2024-01-23 DIAGNOSIS — I10 Essential (primary) hypertension: Secondary | ICD-10-CM | POA: Diagnosis not present

## 2024-01-23 DIAGNOSIS — R296 Repeated falls: Secondary | ICD-10-CM | POA: Diagnosis not present

## 2024-01-23 DIAGNOSIS — Z87442 Personal history of urinary calculi: Secondary | ICD-10-CM | POA: Diagnosis not present

## 2024-01-23 DIAGNOSIS — K5909 Other constipation: Secondary | ICD-10-CM | POA: Diagnosis not present

## 2024-01-23 DIAGNOSIS — E7800 Pure hypercholesterolemia, unspecified: Secondary | ICD-10-CM | POA: Diagnosis not present

## 2024-01-23 DIAGNOSIS — K449 Diaphragmatic hernia without obstruction or gangrene: Secondary | ICD-10-CM | POA: Diagnosis not present

## 2024-01-23 DIAGNOSIS — M810 Age-related osteoporosis without current pathological fracture: Secondary | ICD-10-CM | POA: Diagnosis not present

## 2024-01-23 DIAGNOSIS — K219 Gastro-esophageal reflux disease without esophagitis: Secondary | ICD-10-CM | POA: Diagnosis not present

## 2024-01-23 DIAGNOSIS — I351 Nonrheumatic aortic (valve) insufficiency: Secondary | ICD-10-CM | POA: Diagnosis not present

## 2024-01-23 DIAGNOSIS — G1221 Amyotrophic lateral sclerosis: Secondary | ICD-10-CM | POA: Diagnosis not present

## 2024-01-23 DIAGNOSIS — E440 Moderate protein-calorie malnutrition: Secondary | ICD-10-CM | POA: Diagnosis not present

## 2024-01-25 DIAGNOSIS — D599 Acquired hemolytic anemia, unspecified: Secondary | ICD-10-CM | POA: Diagnosis not present

## 2024-01-25 DIAGNOSIS — M810 Age-related osteoporosis without current pathological fracture: Secondary | ICD-10-CM | POA: Diagnosis not present

## 2024-01-25 DIAGNOSIS — I10 Essential (primary) hypertension: Secondary | ICD-10-CM | POA: Diagnosis not present

## 2024-01-26 DIAGNOSIS — E440 Moderate protein-calorie malnutrition: Secondary | ICD-10-CM | POA: Diagnosis not present

## 2024-01-26 DIAGNOSIS — Z8781 Personal history of (healed) traumatic fracture: Secondary | ICD-10-CM | POA: Diagnosis not present

## 2024-01-26 DIAGNOSIS — J4520 Mild intermittent asthma, uncomplicated: Secondary | ICD-10-CM | POA: Diagnosis not present

## 2024-01-26 DIAGNOSIS — I351 Nonrheumatic aortic (valve) insufficiency: Secondary | ICD-10-CM | POA: Diagnosis not present

## 2024-01-26 DIAGNOSIS — I10 Essential (primary) hypertension: Secondary | ICD-10-CM | POA: Diagnosis not present

## 2024-01-26 DIAGNOSIS — E7800 Pure hypercholesterolemia, unspecified: Secondary | ICD-10-CM | POA: Diagnosis not present

## 2024-01-26 DIAGNOSIS — K449 Diaphragmatic hernia without obstruction or gangrene: Secondary | ICD-10-CM | POA: Diagnosis not present

## 2024-01-26 DIAGNOSIS — G1221 Amyotrophic lateral sclerosis: Secondary | ICD-10-CM | POA: Diagnosis not present

## 2024-01-26 DIAGNOSIS — K5909 Other constipation: Secondary | ICD-10-CM | POA: Diagnosis not present

## 2024-01-26 DIAGNOSIS — Z87442 Personal history of urinary calculi: Secondary | ICD-10-CM | POA: Diagnosis not present

## 2024-01-26 DIAGNOSIS — M810 Age-related osteoporosis without current pathological fracture: Secondary | ICD-10-CM | POA: Diagnosis not present

## 2024-01-26 DIAGNOSIS — K222 Esophageal obstruction: Secondary | ICD-10-CM | POA: Diagnosis not present

## 2024-01-26 DIAGNOSIS — K219 Gastro-esophageal reflux disease without esophagitis: Secondary | ICD-10-CM | POA: Diagnosis not present

## 2024-01-26 DIAGNOSIS — R296 Repeated falls: Secondary | ICD-10-CM | POA: Diagnosis not present

## 2024-01-26 DIAGNOSIS — Z7951 Long term (current) use of inhaled steroids: Secondary | ICD-10-CM | POA: Diagnosis not present

## 2024-01-26 DIAGNOSIS — E876 Hypokalemia: Secondary | ICD-10-CM | POA: Diagnosis not present

## 2024-01-27 DIAGNOSIS — Z87442 Personal history of urinary calculi: Secondary | ICD-10-CM | POA: Diagnosis not present

## 2024-01-27 DIAGNOSIS — K219 Gastro-esophageal reflux disease without esophagitis: Secondary | ICD-10-CM | POA: Diagnosis not present

## 2024-01-27 DIAGNOSIS — R296 Repeated falls: Secondary | ICD-10-CM | POA: Diagnosis not present

## 2024-01-27 DIAGNOSIS — I10 Essential (primary) hypertension: Secondary | ICD-10-CM | POA: Diagnosis not present

## 2024-01-27 DIAGNOSIS — J4520 Mild intermittent asthma, uncomplicated: Secondary | ICD-10-CM | POA: Diagnosis not present

## 2024-01-27 DIAGNOSIS — I351 Nonrheumatic aortic (valve) insufficiency: Secondary | ICD-10-CM | POA: Diagnosis not present

## 2024-01-27 DIAGNOSIS — G1221 Amyotrophic lateral sclerosis: Secondary | ICD-10-CM | POA: Diagnosis not present

## 2024-01-27 DIAGNOSIS — Z7951 Long term (current) use of inhaled steroids: Secondary | ICD-10-CM | POA: Diagnosis not present

## 2024-01-27 DIAGNOSIS — E7800 Pure hypercholesterolemia, unspecified: Secondary | ICD-10-CM | POA: Diagnosis not present

## 2024-01-27 DIAGNOSIS — K449 Diaphragmatic hernia without obstruction or gangrene: Secondary | ICD-10-CM | POA: Diagnosis not present

## 2024-01-27 DIAGNOSIS — K5909 Other constipation: Secondary | ICD-10-CM | POA: Diagnosis not present

## 2024-01-27 DIAGNOSIS — E440 Moderate protein-calorie malnutrition: Secondary | ICD-10-CM | POA: Diagnosis not present

## 2024-01-27 DIAGNOSIS — M810 Age-related osteoporosis without current pathological fracture: Secondary | ICD-10-CM | POA: Diagnosis not present

## 2024-01-27 DIAGNOSIS — E876 Hypokalemia: Secondary | ICD-10-CM | POA: Diagnosis not present

## 2024-01-27 DIAGNOSIS — Z8781 Personal history of (healed) traumatic fracture: Secondary | ICD-10-CM | POA: Diagnosis not present

## 2024-01-27 DIAGNOSIS — K222 Esophageal obstruction: Secondary | ICD-10-CM | POA: Diagnosis not present

## 2024-01-31 DIAGNOSIS — Z8781 Personal history of (healed) traumatic fracture: Secondary | ICD-10-CM | POA: Diagnosis not present

## 2024-01-31 DIAGNOSIS — K5909 Other constipation: Secondary | ICD-10-CM | POA: Diagnosis not present

## 2024-01-31 DIAGNOSIS — G1221 Amyotrophic lateral sclerosis: Secondary | ICD-10-CM | POA: Diagnosis not present

## 2024-01-31 DIAGNOSIS — E876 Hypokalemia: Secondary | ICD-10-CM | POA: Diagnosis not present

## 2024-01-31 DIAGNOSIS — E440 Moderate protein-calorie malnutrition: Secondary | ICD-10-CM | POA: Diagnosis not present

## 2024-01-31 DIAGNOSIS — Z7951 Long term (current) use of inhaled steroids: Secondary | ICD-10-CM | POA: Diagnosis not present

## 2024-01-31 DIAGNOSIS — K449 Diaphragmatic hernia without obstruction or gangrene: Secondary | ICD-10-CM | POA: Diagnosis not present

## 2024-01-31 DIAGNOSIS — E7800 Pure hypercholesterolemia, unspecified: Secondary | ICD-10-CM | POA: Diagnosis not present

## 2024-01-31 DIAGNOSIS — R296 Repeated falls: Secondary | ICD-10-CM | POA: Diagnosis not present

## 2024-01-31 DIAGNOSIS — J4520 Mild intermittent asthma, uncomplicated: Secondary | ICD-10-CM | POA: Diagnosis not present

## 2024-01-31 DIAGNOSIS — K219 Gastro-esophageal reflux disease without esophagitis: Secondary | ICD-10-CM | POA: Diagnosis not present

## 2024-01-31 DIAGNOSIS — Z87442 Personal history of urinary calculi: Secondary | ICD-10-CM | POA: Diagnosis not present

## 2024-01-31 DIAGNOSIS — M810 Age-related osteoporosis without current pathological fracture: Secondary | ICD-10-CM | POA: Diagnosis not present

## 2024-01-31 DIAGNOSIS — I351 Nonrheumatic aortic (valve) insufficiency: Secondary | ICD-10-CM | POA: Diagnosis not present

## 2024-01-31 DIAGNOSIS — K222 Esophageal obstruction: Secondary | ICD-10-CM | POA: Diagnosis not present

## 2024-01-31 DIAGNOSIS — I10 Essential (primary) hypertension: Secondary | ICD-10-CM | POA: Diagnosis not present

## 2024-02-01 DIAGNOSIS — I10 Essential (primary) hypertension: Secondary | ICD-10-CM | POA: Diagnosis not present

## 2024-02-01 DIAGNOSIS — G1221 Amyotrophic lateral sclerosis: Secondary | ICD-10-CM | POA: Diagnosis not present

## 2024-02-01 DIAGNOSIS — E876 Hypokalemia: Secondary | ICD-10-CM | POA: Diagnosis not present

## 2024-02-01 DIAGNOSIS — E440 Moderate protein-calorie malnutrition: Secondary | ICD-10-CM | POA: Diagnosis not present

## 2024-02-01 DIAGNOSIS — M810 Age-related osteoporosis without current pathological fracture: Secondary | ICD-10-CM | POA: Diagnosis not present

## 2024-02-01 DIAGNOSIS — J4520 Mild intermittent asthma, uncomplicated: Secondary | ICD-10-CM | POA: Diagnosis not present

## 2024-02-01 DIAGNOSIS — I351 Nonrheumatic aortic (valve) insufficiency: Secondary | ICD-10-CM | POA: Diagnosis not present

## 2024-02-01 DIAGNOSIS — Z87442 Personal history of urinary calculi: Secondary | ICD-10-CM | POA: Diagnosis not present

## 2024-02-01 DIAGNOSIS — Z8781 Personal history of (healed) traumatic fracture: Secondary | ICD-10-CM | POA: Diagnosis not present

## 2024-02-01 DIAGNOSIS — R296 Repeated falls: Secondary | ICD-10-CM | POA: Diagnosis not present

## 2024-02-01 DIAGNOSIS — K222 Esophageal obstruction: Secondary | ICD-10-CM | POA: Diagnosis not present

## 2024-02-01 DIAGNOSIS — K219 Gastro-esophageal reflux disease without esophagitis: Secondary | ICD-10-CM | POA: Diagnosis not present

## 2024-02-01 DIAGNOSIS — K449 Diaphragmatic hernia without obstruction or gangrene: Secondary | ICD-10-CM | POA: Diagnosis not present

## 2024-02-01 DIAGNOSIS — K5909 Other constipation: Secondary | ICD-10-CM | POA: Diagnosis not present

## 2024-02-01 DIAGNOSIS — E7800 Pure hypercholesterolemia, unspecified: Secondary | ICD-10-CM | POA: Diagnosis not present

## 2024-02-01 DIAGNOSIS — Z7951 Long term (current) use of inhaled steroids: Secondary | ICD-10-CM | POA: Diagnosis not present

## 2024-02-03 DIAGNOSIS — J4520 Mild intermittent asthma, uncomplicated: Secondary | ICD-10-CM | POA: Diagnosis not present

## 2024-02-03 DIAGNOSIS — G1221 Amyotrophic lateral sclerosis: Secondary | ICD-10-CM | POA: Diagnosis not present

## 2024-02-03 DIAGNOSIS — K222 Esophageal obstruction: Secondary | ICD-10-CM | POA: Diagnosis not present

## 2024-02-03 DIAGNOSIS — E7800 Pure hypercholesterolemia, unspecified: Secondary | ICD-10-CM | POA: Diagnosis not present

## 2024-02-03 DIAGNOSIS — K219 Gastro-esophageal reflux disease without esophagitis: Secondary | ICD-10-CM | POA: Diagnosis not present

## 2024-02-03 DIAGNOSIS — E440 Moderate protein-calorie malnutrition: Secondary | ICD-10-CM | POA: Diagnosis not present

## 2024-02-03 DIAGNOSIS — I351 Nonrheumatic aortic (valve) insufficiency: Secondary | ICD-10-CM | POA: Diagnosis not present

## 2024-02-03 DIAGNOSIS — K449 Diaphragmatic hernia without obstruction or gangrene: Secondary | ICD-10-CM | POA: Diagnosis not present

## 2024-02-03 DIAGNOSIS — K5909 Other constipation: Secondary | ICD-10-CM | POA: Diagnosis not present

## 2024-02-03 DIAGNOSIS — Z87442 Personal history of urinary calculi: Secondary | ICD-10-CM | POA: Diagnosis not present

## 2024-02-03 DIAGNOSIS — E876 Hypokalemia: Secondary | ICD-10-CM | POA: Diagnosis not present

## 2024-02-03 DIAGNOSIS — I10 Essential (primary) hypertension: Secondary | ICD-10-CM | POA: Diagnosis not present

## 2024-02-03 DIAGNOSIS — Z8781 Personal history of (healed) traumatic fracture: Secondary | ICD-10-CM | POA: Diagnosis not present

## 2024-02-03 DIAGNOSIS — M810 Age-related osteoporosis without current pathological fracture: Secondary | ICD-10-CM | POA: Diagnosis not present

## 2024-02-03 DIAGNOSIS — Z7951 Long term (current) use of inhaled steroids: Secondary | ICD-10-CM | POA: Diagnosis not present

## 2024-02-03 DIAGNOSIS — R296 Repeated falls: Secondary | ICD-10-CM | POA: Diagnosis not present

## 2024-02-06 DIAGNOSIS — Z1389 Encounter for screening for other disorder: Secondary | ICD-10-CM | POA: Diagnosis not present

## 2024-02-06 DIAGNOSIS — Z1331 Encounter for screening for depression: Secondary | ICD-10-CM | POA: Diagnosis not present

## 2024-02-06 DIAGNOSIS — G1221 Amyotrophic lateral sclerosis: Secondary | ICD-10-CM | POA: Diagnosis not present

## 2024-02-06 DIAGNOSIS — D599 Acquired hemolytic anemia, unspecified: Secondary | ICD-10-CM | POA: Diagnosis not present

## 2024-02-06 DIAGNOSIS — R54 Age-related physical debility: Secondary | ICD-10-CM | POA: Diagnosis not present

## 2024-02-07 DIAGNOSIS — K5909 Other constipation: Secondary | ICD-10-CM | POA: Diagnosis not present

## 2024-02-07 DIAGNOSIS — K219 Gastro-esophageal reflux disease without esophagitis: Secondary | ICD-10-CM | POA: Diagnosis not present

## 2024-02-07 DIAGNOSIS — Z8781 Personal history of (healed) traumatic fracture: Secondary | ICD-10-CM | POA: Diagnosis not present

## 2024-02-07 DIAGNOSIS — I351 Nonrheumatic aortic (valve) insufficiency: Secondary | ICD-10-CM | POA: Diagnosis not present

## 2024-02-07 DIAGNOSIS — I10 Essential (primary) hypertension: Secondary | ICD-10-CM | POA: Diagnosis not present

## 2024-02-07 DIAGNOSIS — Z87442 Personal history of urinary calculi: Secondary | ICD-10-CM | POA: Diagnosis not present

## 2024-02-07 DIAGNOSIS — J4520 Mild intermittent asthma, uncomplicated: Secondary | ICD-10-CM | POA: Diagnosis not present

## 2024-02-07 DIAGNOSIS — R296 Repeated falls: Secondary | ICD-10-CM | POA: Diagnosis not present

## 2024-02-07 DIAGNOSIS — E440 Moderate protein-calorie malnutrition: Secondary | ICD-10-CM | POA: Diagnosis not present

## 2024-02-07 DIAGNOSIS — E876 Hypokalemia: Secondary | ICD-10-CM | POA: Diagnosis not present

## 2024-02-07 DIAGNOSIS — K222 Esophageal obstruction: Secondary | ICD-10-CM | POA: Diagnosis not present

## 2024-02-07 DIAGNOSIS — K449 Diaphragmatic hernia without obstruction or gangrene: Secondary | ICD-10-CM | POA: Diagnosis not present

## 2024-02-07 DIAGNOSIS — M810 Age-related osteoporosis without current pathological fracture: Secondary | ICD-10-CM | POA: Diagnosis not present

## 2024-02-07 DIAGNOSIS — Z7951 Long term (current) use of inhaled steroids: Secondary | ICD-10-CM | POA: Diagnosis not present

## 2024-02-07 DIAGNOSIS — G1221 Amyotrophic lateral sclerosis: Secondary | ICD-10-CM | POA: Diagnosis not present

## 2024-02-07 DIAGNOSIS — E7800 Pure hypercholesterolemia, unspecified: Secondary | ICD-10-CM | POA: Diagnosis not present

## 2024-02-10 DIAGNOSIS — G1221 Amyotrophic lateral sclerosis: Secondary | ICD-10-CM | POA: Diagnosis not present

## 2024-02-13 DIAGNOSIS — E876 Hypokalemia: Secondary | ICD-10-CM | POA: Diagnosis not present

## 2024-02-13 DIAGNOSIS — M810 Age-related osteoporosis without current pathological fracture: Secondary | ICD-10-CM | POA: Diagnosis not present

## 2024-02-13 DIAGNOSIS — G1221 Amyotrophic lateral sclerosis: Secondary | ICD-10-CM

## 2024-02-13 DIAGNOSIS — Z7951 Long term (current) use of inhaled steroids: Secondary | ICD-10-CM | POA: Diagnosis not present

## 2024-02-13 DIAGNOSIS — K219 Gastro-esophageal reflux disease without esophagitis: Secondary | ICD-10-CM | POA: Diagnosis not present

## 2024-02-13 DIAGNOSIS — Z87442 Personal history of urinary calculi: Secondary | ICD-10-CM | POA: Diagnosis not present

## 2024-02-13 DIAGNOSIS — E440 Moderate protein-calorie malnutrition: Secondary | ICD-10-CM | POA: Diagnosis not present

## 2024-02-13 DIAGNOSIS — J4520 Mild intermittent asthma, uncomplicated: Secondary | ICD-10-CM | POA: Diagnosis not present

## 2024-02-13 DIAGNOSIS — Z8781 Personal history of (healed) traumatic fracture: Secondary | ICD-10-CM | POA: Diagnosis not present

## 2024-02-13 DIAGNOSIS — E7800 Pure hypercholesterolemia, unspecified: Secondary | ICD-10-CM | POA: Diagnosis not present

## 2024-02-13 DIAGNOSIS — K222 Esophageal obstruction: Secondary | ICD-10-CM | POA: Diagnosis not present

## 2024-02-13 DIAGNOSIS — I351 Nonrheumatic aortic (valve) insufficiency: Secondary | ICD-10-CM | POA: Diagnosis not present

## 2024-02-13 DIAGNOSIS — R296 Repeated falls: Secondary | ICD-10-CM | POA: Diagnosis not present

## 2024-02-13 DIAGNOSIS — I10 Essential (primary) hypertension: Secondary | ICD-10-CM | POA: Diagnosis not present

## 2024-02-13 DIAGNOSIS — K449 Diaphragmatic hernia without obstruction or gangrene: Secondary | ICD-10-CM | POA: Diagnosis not present

## 2024-02-13 DIAGNOSIS — K5909 Other constipation: Secondary | ICD-10-CM | POA: Diagnosis not present

## 2024-02-13 HISTORY — DX: Amyotrophic lateral sclerosis: G12.21

## 2024-02-15 DIAGNOSIS — Z7409 Other reduced mobility: Secondary | ICD-10-CM | POA: Diagnosis not present

## 2024-02-15 DIAGNOSIS — Z789 Other specified health status: Secondary | ICD-10-CM | POA: Diagnosis not present

## 2024-02-15 DIAGNOSIS — R1312 Dysphagia, oropharyngeal phase: Secondary | ICD-10-CM | POA: Diagnosis not present

## 2024-02-15 DIAGNOSIS — Z008 Encounter for other general examination: Secondary | ICD-10-CM | POA: Diagnosis not present

## 2024-02-15 DIAGNOSIS — Z9189 Other specified personal risk factors, not elsewhere classified: Secondary | ICD-10-CM | POA: Diagnosis not present

## 2024-02-15 DIAGNOSIS — R471 Dysarthria and anarthria: Secondary | ICD-10-CM | POA: Diagnosis not present

## 2024-02-15 DIAGNOSIS — R638 Other symptoms and signs concerning food and fluid intake: Secondary | ICD-10-CM | POA: Diagnosis not present

## 2024-02-15 DIAGNOSIS — G1221 Amyotrophic lateral sclerosis: Secondary | ICD-10-CM | POA: Diagnosis not present

## 2024-02-15 DIAGNOSIS — R531 Weakness: Secondary | ICD-10-CM | POA: Diagnosis not present

## 2024-02-15 DIAGNOSIS — E638 Other specified nutritional deficiencies: Secondary | ICD-10-CM | POA: Diagnosis not present

## 2024-02-15 DIAGNOSIS — E639 Nutritional deficiency, unspecified: Secondary | ICD-10-CM | POA: Diagnosis not present

## 2024-02-15 DIAGNOSIS — R7989 Other specified abnormal findings of blood chemistry: Secondary | ICD-10-CM | POA: Diagnosis not present

## 2024-02-15 DIAGNOSIS — R7401 Elevation of levels of liver transaminase levels: Secondary | ICD-10-CM | POA: Diagnosis not present

## 2024-02-16 DIAGNOSIS — K449 Diaphragmatic hernia without obstruction or gangrene: Secondary | ICD-10-CM | POA: Diagnosis not present

## 2024-02-16 DIAGNOSIS — K5909 Other constipation: Secondary | ICD-10-CM | POA: Diagnosis not present

## 2024-02-16 DIAGNOSIS — E440 Moderate protein-calorie malnutrition: Secondary | ICD-10-CM | POA: Diagnosis not present

## 2024-02-16 DIAGNOSIS — G1221 Amyotrophic lateral sclerosis: Secondary | ICD-10-CM | POA: Diagnosis not present

## 2024-02-16 DIAGNOSIS — K219 Gastro-esophageal reflux disease without esophagitis: Secondary | ICD-10-CM | POA: Diagnosis not present

## 2024-02-16 DIAGNOSIS — E876 Hypokalemia: Secondary | ICD-10-CM | POA: Diagnosis not present

## 2024-02-16 DIAGNOSIS — I351 Nonrheumatic aortic (valve) insufficiency: Secondary | ICD-10-CM | POA: Diagnosis not present

## 2024-02-16 DIAGNOSIS — K222 Esophageal obstruction: Secondary | ICD-10-CM | POA: Diagnosis not present

## 2024-02-16 DIAGNOSIS — M810 Age-related osteoporosis without current pathological fracture: Secondary | ICD-10-CM | POA: Diagnosis not present

## 2024-02-16 DIAGNOSIS — Z87442 Personal history of urinary calculi: Secondary | ICD-10-CM | POA: Diagnosis not present

## 2024-02-16 DIAGNOSIS — Z8781 Personal history of (healed) traumatic fracture: Secondary | ICD-10-CM | POA: Diagnosis not present

## 2024-02-16 DIAGNOSIS — E7800 Pure hypercholesterolemia, unspecified: Secondary | ICD-10-CM | POA: Diagnosis not present

## 2024-02-16 DIAGNOSIS — J4520 Mild intermittent asthma, uncomplicated: Secondary | ICD-10-CM | POA: Diagnosis not present

## 2024-02-16 DIAGNOSIS — I10 Essential (primary) hypertension: Secondary | ICD-10-CM | POA: Diagnosis not present

## 2024-02-16 DIAGNOSIS — Z7951 Long term (current) use of inhaled steroids: Secondary | ICD-10-CM | POA: Diagnosis not present

## 2024-02-16 DIAGNOSIS — R296 Repeated falls: Secondary | ICD-10-CM | POA: Diagnosis not present

## 2024-02-17 DIAGNOSIS — I351 Nonrheumatic aortic (valve) insufficiency: Secondary | ICD-10-CM | POA: Diagnosis not present

## 2024-02-17 DIAGNOSIS — G1221 Amyotrophic lateral sclerosis: Secondary | ICD-10-CM | POA: Diagnosis not present

## 2024-02-17 DIAGNOSIS — Z8781 Personal history of (healed) traumatic fracture: Secondary | ICD-10-CM | POA: Diagnosis not present

## 2024-02-17 DIAGNOSIS — K449 Diaphragmatic hernia without obstruction or gangrene: Secondary | ICD-10-CM | POA: Diagnosis not present

## 2024-02-17 DIAGNOSIS — E440 Moderate protein-calorie malnutrition: Secondary | ICD-10-CM | POA: Diagnosis not present

## 2024-02-17 DIAGNOSIS — K219 Gastro-esophageal reflux disease without esophagitis: Secondary | ICD-10-CM | POA: Diagnosis not present

## 2024-02-17 DIAGNOSIS — K222 Esophageal obstruction: Secondary | ICD-10-CM | POA: Diagnosis not present

## 2024-02-17 DIAGNOSIS — R296 Repeated falls: Secondary | ICD-10-CM | POA: Diagnosis not present

## 2024-02-17 DIAGNOSIS — Z7951 Long term (current) use of inhaled steroids: Secondary | ICD-10-CM | POA: Diagnosis not present

## 2024-02-17 DIAGNOSIS — J4520 Mild intermittent asthma, uncomplicated: Secondary | ICD-10-CM | POA: Diagnosis not present

## 2024-02-17 DIAGNOSIS — M810 Age-related osteoporosis without current pathological fracture: Secondary | ICD-10-CM | POA: Diagnosis not present

## 2024-02-17 DIAGNOSIS — K5909 Other constipation: Secondary | ICD-10-CM | POA: Diagnosis not present

## 2024-02-17 DIAGNOSIS — E7800 Pure hypercholesterolemia, unspecified: Secondary | ICD-10-CM | POA: Diagnosis not present

## 2024-02-17 DIAGNOSIS — I10 Essential (primary) hypertension: Secondary | ICD-10-CM | POA: Diagnosis not present

## 2024-02-17 DIAGNOSIS — E876 Hypokalemia: Secondary | ICD-10-CM | POA: Diagnosis not present

## 2024-02-17 DIAGNOSIS — Z87442 Personal history of urinary calculi: Secondary | ICD-10-CM | POA: Diagnosis not present

## 2024-02-21 DIAGNOSIS — K449 Diaphragmatic hernia without obstruction or gangrene: Secondary | ICD-10-CM | POA: Diagnosis not present

## 2024-02-21 DIAGNOSIS — I351 Nonrheumatic aortic (valve) insufficiency: Secondary | ICD-10-CM | POA: Diagnosis not present

## 2024-02-21 DIAGNOSIS — E7800 Pure hypercholesterolemia, unspecified: Secondary | ICD-10-CM | POA: Diagnosis not present

## 2024-02-21 DIAGNOSIS — E876 Hypokalemia: Secondary | ICD-10-CM | POA: Diagnosis not present

## 2024-02-21 DIAGNOSIS — I10 Essential (primary) hypertension: Secondary | ICD-10-CM | POA: Diagnosis not present

## 2024-02-21 DIAGNOSIS — K222 Esophageal obstruction: Secondary | ICD-10-CM | POA: Diagnosis not present

## 2024-02-21 DIAGNOSIS — Z7951 Long term (current) use of inhaled steroids: Secondary | ICD-10-CM | POA: Diagnosis not present

## 2024-02-21 DIAGNOSIS — E440 Moderate protein-calorie malnutrition: Secondary | ICD-10-CM | POA: Diagnosis not present

## 2024-02-21 DIAGNOSIS — Z8781 Personal history of (healed) traumatic fracture: Secondary | ICD-10-CM | POA: Diagnosis not present

## 2024-02-21 DIAGNOSIS — G1221 Amyotrophic lateral sclerosis: Secondary | ICD-10-CM | POA: Diagnosis not present

## 2024-02-21 DIAGNOSIS — K5909 Other constipation: Secondary | ICD-10-CM | POA: Diagnosis not present

## 2024-02-21 DIAGNOSIS — K219 Gastro-esophageal reflux disease without esophagitis: Secondary | ICD-10-CM | POA: Diagnosis not present

## 2024-02-21 DIAGNOSIS — Z87442 Personal history of urinary calculi: Secondary | ICD-10-CM | POA: Diagnosis not present

## 2024-02-21 DIAGNOSIS — R296 Repeated falls: Secondary | ICD-10-CM | POA: Diagnosis not present

## 2024-02-21 DIAGNOSIS — J4520 Mild intermittent asthma, uncomplicated: Secondary | ICD-10-CM | POA: Diagnosis not present

## 2024-02-21 DIAGNOSIS — M810 Age-related osteoporosis without current pathological fracture: Secondary | ICD-10-CM | POA: Diagnosis not present

## 2024-02-23 DIAGNOSIS — I10 Essential (primary) hypertension: Secondary | ICD-10-CM | POA: Diagnosis not present

## 2024-02-23 DIAGNOSIS — J4520 Mild intermittent asthma, uncomplicated: Secondary | ICD-10-CM | POA: Diagnosis not present

## 2024-02-23 DIAGNOSIS — M810 Age-related osteoporosis without current pathological fracture: Secondary | ICD-10-CM | POA: Diagnosis not present

## 2024-02-23 DIAGNOSIS — E7800 Pure hypercholesterolemia, unspecified: Secondary | ICD-10-CM | POA: Diagnosis not present

## 2024-02-23 DIAGNOSIS — G1221 Amyotrophic lateral sclerosis: Secondary | ICD-10-CM | POA: Diagnosis not present

## 2024-02-23 DIAGNOSIS — K449 Diaphragmatic hernia without obstruction or gangrene: Secondary | ICD-10-CM | POA: Diagnosis not present

## 2024-02-23 DIAGNOSIS — Z7951 Long term (current) use of inhaled steroids: Secondary | ICD-10-CM | POA: Diagnosis not present

## 2024-02-23 DIAGNOSIS — K222 Esophageal obstruction: Secondary | ICD-10-CM | POA: Diagnosis not present

## 2024-02-23 DIAGNOSIS — Z87442 Personal history of urinary calculi: Secondary | ICD-10-CM | POA: Diagnosis not present

## 2024-02-23 DIAGNOSIS — I351 Nonrheumatic aortic (valve) insufficiency: Secondary | ICD-10-CM | POA: Diagnosis not present

## 2024-02-23 DIAGNOSIS — E876 Hypokalemia: Secondary | ICD-10-CM | POA: Diagnosis not present

## 2024-02-23 DIAGNOSIS — R296 Repeated falls: Secondary | ICD-10-CM | POA: Diagnosis not present

## 2024-02-23 DIAGNOSIS — K5909 Other constipation: Secondary | ICD-10-CM | POA: Diagnosis not present

## 2024-02-23 DIAGNOSIS — Z8781 Personal history of (healed) traumatic fracture: Secondary | ICD-10-CM | POA: Diagnosis not present

## 2024-02-23 DIAGNOSIS — E440 Moderate protein-calorie malnutrition: Secondary | ICD-10-CM | POA: Diagnosis not present

## 2024-02-23 DIAGNOSIS — K219 Gastro-esophageal reflux disease without esophagitis: Secondary | ICD-10-CM | POA: Diagnosis not present

## 2024-02-29 DIAGNOSIS — Z8781 Personal history of (healed) traumatic fracture: Secondary | ICD-10-CM | POA: Diagnosis not present

## 2024-02-29 DIAGNOSIS — I10 Essential (primary) hypertension: Secondary | ICD-10-CM | POA: Diagnosis not present

## 2024-02-29 DIAGNOSIS — M810 Age-related osteoporosis without current pathological fracture: Secondary | ICD-10-CM | POA: Diagnosis not present

## 2024-02-29 DIAGNOSIS — G1221 Amyotrophic lateral sclerosis: Secondary | ICD-10-CM | POA: Diagnosis not present

## 2024-02-29 DIAGNOSIS — K449 Diaphragmatic hernia without obstruction or gangrene: Secondary | ICD-10-CM | POA: Diagnosis not present

## 2024-02-29 DIAGNOSIS — K5909 Other constipation: Secondary | ICD-10-CM | POA: Diagnosis not present

## 2024-02-29 DIAGNOSIS — Z7951 Long term (current) use of inhaled steroids: Secondary | ICD-10-CM | POA: Diagnosis not present

## 2024-02-29 DIAGNOSIS — J4520 Mild intermittent asthma, uncomplicated: Secondary | ICD-10-CM | POA: Diagnosis not present

## 2024-02-29 DIAGNOSIS — E876 Hypokalemia: Secondary | ICD-10-CM | POA: Diagnosis not present

## 2024-02-29 DIAGNOSIS — K222 Esophageal obstruction: Secondary | ICD-10-CM | POA: Diagnosis not present

## 2024-02-29 DIAGNOSIS — R296 Repeated falls: Secondary | ICD-10-CM | POA: Diagnosis not present

## 2024-02-29 DIAGNOSIS — E440 Moderate protein-calorie malnutrition: Secondary | ICD-10-CM | POA: Diagnosis not present

## 2024-02-29 DIAGNOSIS — I351 Nonrheumatic aortic (valve) insufficiency: Secondary | ICD-10-CM | POA: Diagnosis not present

## 2024-02-29 DIAGNOSIS — Z87442 Personal history of urinary calculi: Secondary | ICD-10-CM | POA: Diagnosis not present

## 2024-02-29 DIAGNOSIS — E7800 Pure hypercholesterolemia, unspecified: Secondary | ICD-10-CM | POA: Diagnosis not present

## 2024-02-29 DIAGNOSIS — K219 Gastro-esophageal reflux disease without esophagitis: Secondary | ICD-10-CM | POA: Diagnosis not present

## 2024-03-02 DIAGNOSIS — K219 Gastro-esophageal reflux disease without esophagitis: Secondary | ICD-10-CM | POA: Diagnosis not present

## 2024-03-02 DIAGNOSIS — M810 Age-related osteoporosis without current pathological fracture: Secondary | ICD-10-CM | POA: Diagnosis not present

## 2024-03-02 DIAGNOSIS — I10 Essential (primary) hypertension: Secondary | ICD-10-CM | POA: Diagnosis not present

## 2024-03-02 DIAGNOSIS — R296 Repeated falls: Secondary | ICD-10-CM | POA: Diagnosis not present

## 2024-03-02 DIAGNOSIS — E876 Hypokalemia: Secondary | ICD-10-CM | POA: Diagnosis not present

## 2024-03-02 DIAGNOSIS — E440 Moderate protein-calorie malnutrition: Secondary | ICD-10-CM | POA: Diagnosis not present

## 2024-03-02 DIAGNOSIS — Z8781 Personal history of (healed) traumatic fracture: Secondary | ICD-10-CM | POA: Diagnosis not present

## 2024-03-02 DIAGNOSIS — Z87442 Personal history of urinary calculi: Secondary | ICD-10-CM | POA: Diagnosis not present

## 2024-03-02 DIAGNOSIS — I351 Nonrheumatic aortic (valve) insufficiency: Secondary | ICD-10-CM | POA: Diagnosis not present

## 2024-03-02 DIAGNOSIS — Z7951 Long term (current) use of inhaled steroids: Secondary | ICD-10-CM | POA: Diagnosis not present

## 2024-03-02 DIAGNOSIS — E7800 Pure hypercholesterolemia, unspecified: Secondary | ICD-10-CM | POA: Diagnosis not present

## 2024-03-02 DIAGNOSIS — K449 Diaphragmatic hernia without obstruction or gangrene: Secondary | ICD-10-CM | POA: Diagnosis not present

## 2024-03-02 DIAGNOSIS — G1221 Amyotrophic lateral sclerosis: Secondary | ICD-10-CM | POA: Diagnosis not present

## 2024-03-02 DIAGNOSIS — J4520 Mild intermittent asthma, uncomplicated: Secondary | ICD-10-CM | POA: Diagnosis not present

## 2024-03-02 DIAGNOSIS — K222 Esophageal obstruction: Secondary | ICD-10-CM | POA: Diagnosis not present

## 2024-03-02 DIAGNOSIS — K5909 Other constipation: Secondary | ICD-10-CM | POA: Diagnosis not present

## 2024-03-05 DIAGNOSIS — E440 Moderate protein-calorie malnutrition: Secondary | ICD-10-CM | POA: Diagnosis not present

## 2024-03-05 DIAGNOSIS — K219 Gastro-esophageal reflux disease without esophagitis: Secondary | ICD-10-CM | POA: Diagnosis not present

## 2024-03-05 DIAGNOSIS — I10 Essential (primary) hypertension: Secondary | ICD-10-CM | POA: Diagnosis not present

## 2024-03-05 DIAGNOSIS — K5909 Other constipation: Secondary | ICD-10-CM | POA: Diagnosis not present

## 2024-03-05 DIAGNOSIS — G1221 Amyotrophic lateral sclerosis: Secondary | ICD-10-CM | POA: Diagnosis not present

## 2024-03-05 DIAGNOSIS — I351 Nonrheumatic aortic (valve) insufficiency: Secondary | ICD-10-CM | POA: Diagnosis not present

## 2024-03-05 DIAGNOSIS — J4520 Mild intermittent asthma, uncomplicated: Secondary | ICD-10-CM | POA: Diagnosis not present

## 2024-03-05 DIAGNOSIS — K449 Diaphragmatic hernia without obstruction or gangrene: Secondary | ICD-10-CM | POA: Diagnosis not present

## 2024-03-05 DIAGNOSIS — E7800 Pure hypercholesterolemia, unspecified: Secondary | ICD-10-CM | POA: Diagnosis not present

## 2024-03-05 DIAGNOSIS — Z87442 Personal history of urinary calculi: Secondary | ICD-10-CM | POA: Diagnosis not present

## 2024-03-05 DIAGNOSIS — R296 Repeated falls: Secondary | ICD-10-CM | POA: Diagnosis not present

## 2024-03-05 DIAGNOSIS — Z7951 Long term (current) use of inhaled steroids: Secondary | ICD-10-CM | POA: Diagnosis not present

## 2024-03-05 DIAGNOSIS — Z8781 Personal history of (healed) traumatic fracture: Secondary | ICD-10-CM | POA: Diagnosis not present

## 2024-03-05 DIAGNOSIS — E876 Hypokalemia: Secondary | ICD-10-CM | POA: Diagnosis not present

## 2024-03-05 DIAGNOSIS — M810 Age-related osteoporosis without current pathological fracture: Secondary | ICD-10-CM | POA: Diagnosis not present

## 2024-03-05 DIAGNOSIS — K222 Esophageal obstruction: Secondary | ICD-10-CM | POA: Diagnosis not present

## 2024-03-06 DIAGNOSIS — Z1331 Encounter for screening for depression: Secondary | ICD-10-CM | POA: Diagnosis not present

## 2024-03-06 DIAGNOSIS — J4520 Mild intermittent asthma, uncomplicated: Secondary | ICD-10-CM | POA: Diagnosis not present

## 2024-03-06 DIAGNOSIS — N951 Menopausal and female climacteric states: Secondary | ICD-10-CM | POA: Diagnosis not present

## 2024-03-06 DIAGNOSIS — M810 Age-related osteoporosis without current pathological fracture: Secondary | ICD-10-CM | POA: Diagnosis not present

## 2024-03-07 DIAGNOSIS — M810 Age-related osteoporosis without current pathological fracture: Secondary | ICD-10-CM | POA: Diagnosis not present

## 2024-03-09 DIAGNOSIS — G1221 Amyotrophic lateral sclerosis: Secondary | ICD-10-CM | POA: Diagnosis not present

## 2024-03-09 DIAGNOSIS — K222 Esophageal obstruction: Secondary | ICD-10-CM | POA: Diagnosis not present

## 2024-03-09 DIAGNOSIS — E440 Moderate protein-calorie malnutrition: Secondary | ICD-10-CM | POA: Diagnosis not present

## 2024-03-09 DIAGNOSIS — Z7951 Long term (current) use of inhaled steroids: Secondary | ICD-10-CM | POA: Diagnosis not present

## 2024-03-09 DIAGNOSIS — I10 Essential (primary) hypertension: Secondary | ICD-10-CM | POA: Diagnosis not present

## 2024-03-09 DIAGNOSIS — I351 Nonrheumatic aortic (valve) insufficiency: Secondary | ICD-10-CM | POA: Diagnosis not present

## 2024-03-09 DIAGNOSIS — R296 Repeated falls: Secondary | ICD-10-CM | POA: Diagnosis not present

## 2024-03-09 DIAGNOSIS — M810 Age-related osteoporosis without current pathological fracture: Secondary | ICD-10-CM | POA: Diagnosis not present

## 2024-03-09 DIAGNOSIS — K219 Gastro-esophageal reflux disease without esophagitis: Secondary | ICD-10-CM | POA: Diagnosis not present

## 2024-03-09 DIAGNOSIS — E7800 Pure hypercholesterolemia, unspecified: Secondary | ICD-10-CM | POA: Diagnosis not present

## 2024-03-09 DIAGNOSIS — Z87442 Personal history of urinary calculi: Secondary | ICD-10-CM | POA: Diagnosis not present

## 2024-03-09 DIAGNOSIS — Z8781 Personal history of (healed) traumatic fracture: Secondary | ICD-10-CM | POA: Diagnosis not present

## 2024-03-09 DIAGNOSIS — J4520 Mild intermittent asthma, uncomplicated: Secondary | ICD-10-CM | POA: Diagnosis not present

## 2024-03-09 DIAGNOSIS — E876 Hypokalemia: Secondary | ICD-10-CM | POA: Diagnosis not present

## 2024-03-09 DIAGNOSIS — K5909 Other constipation: Secondary | ICD-10-CM | POA: Diagnosis not present

## 2024-03-09 DIAGNOSIS — K449 Diaphragmatic hernia without obstruction or gangrene: Secondary | ICD-10-CM | POA: Diagnosis not present

## 2024-03-09 DIAGNOSIS — R471 Dysarthria and anarthria: Secondary | ICD-10-CM | POA: Diagnosis not present

## 2024-03-15 DIAGNOSIS — J4520 Mild intermittent asthma, uncomplicated: Secondary | ICD-10-CM | POA: Diagnosis not present

## 2024-03-15 DIAGNOSIS — R296 Repeated falls: Secondary | ICD-10-CM | POA: Diagnosis not present

## 2024-03-15 DIAGNOSIS — E7800 Pure hypercholesterolemia, unspecified: Secondary | ICD-10-CM | POA: Diagnosis not present

## 2024-03-15 DIAGNOSIS — E876 Hypokalemia: Secondary | ICD-10-CM | POA: Diagnosis not present

## 2024-03-15 DIAGNOSIS — K5909 Other constipation: Secondary | ICD-10-CM | POA: Diagnosis not present

## 2024-03-15 DIAGNOSIS — K449 Diaphragmatic hernia without obstruction or gangrene: Secondary | ICD-10-CM | POA: Diagnosis not present

## 2024-03-15 DIAGNOSIS — K219 Gastro-esophageal reflux disease without esophagitis: Secondary | ICD-10-CM | POA: Diagnosis not present

## 2024-03-15 DIAGNOSIS — I10 Essential (primary) hypertension: Secondary | ICD-10-CM | POA: Diagnosis not present

## 2024-03-15 DIAGNOSIS — Z8781 Personal history of (healed) traumatic fracture: Secondary | ICD-10-CM | POA: Diagnosis not present

## 2024-03-15 DIAGNOSIS — K222 Esophageal obstruction: Secondary | ICD-10-CM | POA: Diagnosis not present

## 2024-03-15 DIAGNOSIS — E440 Moderate protein-calorie malnutrition: Secondary | ICD-10-CM | POA: Diagnosis not present

## 2024-03-15 DIAGNOSIS — G1221 Amyotrophic lateral sclerosis: Secondary | ICD-10-CM | POA: Diagnosis not present

## 2024-03-15 DIAGNOSIS — Z7951 Long term (current) use of inhaled steroids: Secondary | ICD-10-CM | POA: Diagnosis not present

## 2024-03-15 DIAGNOSIS — M810 Age-related osteoporosis without current pathological fracture: Secondary | ICD-10-CM | POA: Diagnosis not present

## 2024-03-15 DIAGNOSIS — Z87442 Personal history of urinary calculi: Secondary | ICD-10-CM | POA: Diagnosis not present

## 2024-03-15 DIAGNOSIS — I351 Nonrheumatic aortic (valve) insufficiency: Secondary | ICD-10-CM | POA: Diagnosis not present

## 2024-03-16 DIAGNOSIS — R296 Repeated falls: Secondary | ICD-10-CM | POA: Diagnosis not present

## 2024-03-16 DIAGNOSIS — M810 Age-related osteoporosis without current pathological fracture: Secondary | ICD-10-CM | POA: Diagnosis not present

## 2024-03-16 DIAGNOSIS — I351 Nonrheumatic aortic (valve) insufficiency: Secondary | ICD-10-CM | POA: Diagnosis not present

## 2024-03-16 DIAGNOSIS — J4520 Mild intermittent asthma, uncomplicated: Secondary | ICD-10-CM | POA: Diagnosis not present

## 2024-03-16 DIAGNOSIS — K222 Esophageal obstruction: Secondary | ICD-10-CM | POA: Diagnosis not present

## 2024-03-16 DIAGNOSIS — I10 Essential (primary) hypertension: Secondary | ICD-10-CM | POA: Diagnosis not present

## 2024-03-16 DIAGNOSIS — K449 Diaphragmatic hernia without obstruction or gangrene: Secondary | ICD-10-CM | POA: Diagnosis not present

## 2024-03-16 DIAGNOSIS — Z7951 Long term (current) use of inhaled steroids: Secondary | ICD-10-CM | POA: Diagnosis not present

## 2024-03-16 DIAGNOSIS — E7800 Pure hypercholesterolemia, unspecified: Secondary | ICD-10-CM | POA: Diagnosis not present

## 2024-03-16 DIAGNOSIS — G1221 Amyotrophic lateral sclerosis: Secondary | ICD-10-CM | POA: Diagnosis not present

## 2024-03-16 DIAGNOSIS — K219 Gastro-esophageal reflux disease without esophagitis: Secondary | ICD-10-CM | POA: Diagnosis not present

## 2024-03-16 DIAGNOSIS — E876 Hypokalemia: Secondary | ICD-10-CM | POA: Diagnosis not present

## 2024-03-16 DIAGNOSIS — Z87442 Personal history of urinary calculi: Secondary | ICD-10-CM | POA: Diagnosis not present

## 2024-03-16 DIAGNOSIS — K5909 Other constipation: Secondary | ICD-10-CM | POA: Diagnosis not present

## 2024-03-16 DIAGNOSIS — E440 Moderate protein-calorie malnutrition: Secondary | ICD-10-CM | POA: Diagnosis not present

## 2024-03-16 DIAGNOSIS — Z8781 Personal history of (healed) traumatic fracture: Secondary | ICD-10-CM | POA: Diagnosis not present

## 2024-03-22 DIAGNOSIS — I351 Nonrheumatic aortic (valve) insufficiency: Secondary | ICD-10-CM | POA: Diagnosis not present

## 2024-03-22 DIAGNOSIS — E876 Hypokalemia: Secondary | ICD-10-CM | POA: Diagnosis not present

## 2024-03-22 DIAGNOSIS — E440 Moderate protein-calorie malnutrition: Secondary | ICD-10-CM | POA: Diagnosis not present

## 2024-03-22 DIAGNOSIS — E7800 Pure hypercholesterolemia, unspecified: Secondary | ICD-10-CM | POA: Diagnosis not present

## 2024-03-22 DIAGNOSIS — K222 Esophageal obstruction: Secondary | ICD-10-CM | POA: Diagnosis not present

## 2024-03-22 DIAGNOSIS — Z8781 Personal history of (healed) traumatic fracture: Secondary | ICD-10-CM | POA: Diagnosis not present

## 2024-03-22 DIAGNOSIS — Z87442 Personal history of urinary calculi: Secondary | ICD-10-CM | POA: Diagnosis not present

## 2024-03-22 DIAGNOSIS — Z7951 Long term (current) use of inhaled steroids: Secondary | ICD-10-CM | POA: Diagnosis not present

## 2024-03-22 DIAGNOSIS — M810 Age-related osteoporosis without current pathological fracture: Secondary | ICD-10-CM | POA: Diagnosis not present

## 2024-03-22 DIAGNOSIS — K5909 Other constipation: Secondary | ICD-10-CM | POA: Diagnosis not present

## 2024-03-22 DIAGNOSIS — J4520 Mild intermittent asthma, uncomplicated: Secondary | ICD-10-CM | POA: Diagnosis not present

## 2024-03-22 DIAGNOSIS — I10 Essential (primary) hypertension: Secondary | ICD-10-CM | POA: Diagnosis not present

## 2024-03-22 DIAGNOSIS — K449 Diaphragmatic hernia without obstruction or gangrene: Secondary | ICD-10-CM | POA: Diagnosis not present

## 2024-03-22 DIAGNOSIS — R296 Repeated falls: Secondary | ICD-10-CM | POA: Diagnosis not present

## 2024-03-22 DIAGNOSIS — K219 Gastro-esophageal reflux disease without esophagitis: Secondary | ICD-10-CM | POA: Diagnosis not present

## 2024-03-22 DIAGNOSIS — G1221 Amyotrophic lateral sclerosis: Secondary | ICD-10-CM | POA: Diagnosis not present

## 2024-03-27 ENCOUNTER — Encounter: Payer: Self-pay | Admitting: Allergy and Immunology

## 2024-03-27 ENCOUNTER — Ambulatory Visit (INDEPENDENT_AMBULATORY_CARE_PROVIDER_SITE_OTHER): Admitting: Allergy and Immunology

## 2024-03-27 ENCOUNTER — Encounter (HOSPITAL_BASED_OUTPATIENT_CLINIC_OR_DEPARTMENT_OTHER): Payer: Self-pay

## 2024-03-27 ENCOUNTER — Other Ambulatory Visit: Payer: BC Managed Care – PPO

## 2024-03-27 ENCOUNTER — Other Ambulatory Visit (HOSPITAL_BASED_OUTPATIENT_CLINIC_OR_DEPARTMENT_OTHER)

## 2024-03-27 VITALS — BP 130/84 | HR 97 | Temp 98.1°F

## 2024-03-27 DIAGNOSIS — J4550 Severe persistent asthma, uncomplicated: Secondary | ICD-10-CM

## 2024-03-27 DIAGNOSIS — G1221 Amyotrophic lateral sclerosis: Secondary | ICD-10-CM | POA: Diagnosis not present

## 2024-03-27 DIAGNOSIS — K219 Gastro-esophageal reflux disease without esophagitis: Secondary | ICD-10-CM

## 2024-03-27 DIAGNOSIS — J3089 Other allergic rhinitis: Secondary | ICD-10-CM | POA: Diagnosis not present

## 2024-03-27 MED ORDER — BREZTRI AEROSPHERE 160-9-4.8 MCG/ACT IN AERO: 2.0000 | INHALATION_SPRAY | Freq: Two times a day (BID) | RESPIRATORY_TRACT | 1 refills | Status: AC

## 2024-03-27 MED ORDER — AIRSUPRA 90-80 MCG/ACT IN AERO: 2.0000 | INHALATION_SPRAY | RESPIRATORY_TRACT | 1 refills | Status: AC | PRN

## 2024-03-27 MED ORDER — PANTOPRAZOLE SODIUM 40 MG PO TBEC: 40.0000 mg | DELAYED_RELEASE_TABLET | Freq: Every morning | ORAL | 1 refills | Status: AC

## 2024-03-27 NOTE — Patient Instructions (Addendum)
  1.  Continue to treat inflammation of airway:  A. Breztri  - 2 inhalations 3-7 times per week with spacer B. Tezepelumab  injections every 4 weeks  2.  Continue to treat reflux induced respiratory tract inflammation:   A. Pantoprazole  40 mg - 1 tablet 3-7 times per week  3.  If Needed: AIRSUPRA  - 2 inhalations every 6 hours  4.  Return to clinic in 6 months or earlier if needed.    5.  Influenza = Tamiflu. Covid = Paxlovid  6. Check nocturnal oximetry if sleep related respiratory symptoms

## 2024-03-27 NOTE — Progress Notes (Unsigned)
 [place] - [place] - [place] - [place] - [place]   Follow-up Note  Referring Provider: Mercer Clotilda SAUNDERS, MD Primary Provider: [facility] Date of Office Visit: 03/27/2024  Subjective:   Angela [name] [facility] (DOB: 1955/10/16) is a 68 y.o. female who [facility] in re-evaluation of the following:  HPI: Angela [name] returns to this clinic in evaluation of severe asthma, allergic rhinitis, reflux, and ALS.  I last saw her in this clinic 20 September 2023.  When I last saw her in this clinic she was having neurological issues including a fall and unsteadiness and a neuropathy and she was subsequently diagnosed with ALS.  She has had pretty substantial progression since her last visit regarding her ALS and is extremely dysarthric and has had significant lower extremity involvement although her upper arm strength is relatively preserved.  She is now in a wheelchair and she does not exert herself.  She has not had any issues with her breathing and she feels as though her breathing is very adequate and she does not really have any shortness of breath.  There is no need to use any short acting bronchodilator while she remains on anti-TSL P antibody and using Breztri  about 4 times a week.  She has not noticed any respiratory tract symptoms while sleeping.  According to her husband she does not appear to have any choking or gagging or gasping or waking from sleep.  She has a home oximeter and her oxygen is usually around 98% during daytime.  She has not been having any problems with reflux or burning in her throat or throat clearing while using pantoprazole  about 3 times per week.  She will be receiving the flu vaccine and COVID-vaccine this week, the RSV and Pneumovax next week, and shingles vaccine at some point thereafter.  Allergies as of 03/27/2024       Reactions   Codeine Nausea And Vomiting, Diarrhea, Nausea Only   Nsaids Other (See  Comments)   BP RISE   Pollen Extract Itching        Medication List    Airsupra  90-80 MCG/ACT Aero Generic drug: Albuterol -Budesonide  Inhale 2 Inhalations into the lungs every 4 (four) hours as needed.   B-12 100 MCG Tabs Take 1 tablet by mouth daily.   Breztri  Aerosphere 160-9-4.8 MCG/ACT Aero inhaler Generic drug: budesonide -glycopyrrolate-formoterol Inhale 2 puffs into the lungs in the morning and at bedtime.   estradiol 1.53 MG/SPRAY transdermal spray Commonly known as: EVAMIST Place 2 sprays onto the skin daily.   pantoprazole  40 MG tablet Commonly known as: PROTONIX  Take 1 tablet (40 mg total) by mouth every morning.   potassium chloride  SA 20 MEQ tablet Commonly known as: KLOR-CON  M TAKE 1 TABLET(20 MEQ) BY MOUTH DAILY   progesterone 100 MG capsule Commonly known as: PROMETRIUM Take 100 mg by mouth at bedtime.   Risedronate Sodium 35 MG Tbec Take 35 mg by mouth once a week.   Spacer/Aero-Holding [employer] 1 Device by Does not apply route as directed.   Tezspire  210 MG/1. Soaj Generic drug: Tezepelumab -ekko Inject 210 mg into the skin every 28 (twenty-eight) days.   triamterene -hydrochlorothiazide 37.5-25 MG capsule Commonly known as: DYAZIDE Take 1 each (1 capsule total) by mouth daily.   Vitamin D2 10 MCG (400 UNIT) Tabs Take 1 tablet by mouth daily.    Past Medical History:  Diagnosis Date   ALS (amyotrophic lateral sclerosis) (HCC) 02/2024   Asthma  Diverticulosis    Elevated C-reactive protein (CRP)    Esophageal stricture    GERD (gastroesophageal reflux disease)    HH (hiatus hernia)    Hypertension    MVP (mitral valve prolapse)    Osteoporosis    Renal calculus     Past Surgical History:  Procedure Laterality Date   Broken Elbow Right    Broken Finger Right    Ring Finger   FOOT FRACTURE SURGERY Left 06/20/2022   FOOT SURGERY Left 05/2023   nerve decompression surgery    Review of systems negative except as noted  in HPI / PMHx or noted below:  Review of Systems  Constitutional: Negative.   HENT: Negative.    Eyes: Negative.   Respiratory: Negative.    Cardiovascular: Negative.   Gastrointestinal: Negative.   Genitourinary: Negative.   Musculoskeletal: Negative.   Skin: Negative.   Neurological: Negative.   Endo/Heme/Allergies: Negative.   Psychiatric/Behavioral: Negative.       Objective:   Vitals:   03/27/24 1023  BP: 130/84  Pulse: 97  Temp: 98.1 F (36.7 C)  SpO2: 98%          Physical Exam Constitutional:      Appearance: She is not diaphoretic.     Comments: wheelchair  HENT:     Head: Normocephalic.     Right Ear: Ear canal and external ear normal.     Left Ear: Ear canal and external ear normal.     Nose: Nose normal. No mucosal edema or rhinorrhea.     Mouth/Throat:     Pharynx: Uvula midline. No oropharyngeal exudate.  Eyes:     Conjunctiva/sclera: Conjunctivae normal.  Neck:     Thyroid : No thyromegaly.     Trachea: Trachea normal. No tracheal tenderness or tracheal deviation.  Cardiovascular:     Rate and Rhythm: Normal rate and regular rhythm.     Heart sounds: S1 normal and S2 normal. Murmur (systolic) heard.  Pulmonary:     Effort: No respiratory distress.     Breath sounds: Normal breath sounds. No stridor. No wheezing or rales.  Lymphadenopathy:     Head:     Right side of head: No tonsillar adenopathy.     Left side of head: No tonsillar adenopathy.     Cervical: No cervical adenopathy.  Skin:    Findings: No erythema or rash.     Nails: There is no clubbing.  Neurological:     Mental Status: She is alert.     Diagnostics: Spirometry was performed and demonstrated an FEV1 of 0.75 at 36 % of predicted.  Assessment and Plan:   1. Asthma, severe persistent, well-controlled (HCC)   2. Other allergic rhinitis   3. LPRD (laryngopharyngeal reflux disease)   4. ALS (amyotrophic lateral sclerosis) (HCC)    1.  Continue to treat inflammation of  airway:  A. Breztri  - 2 inhalations 3-7 times per week with spacer B. Tezepelumab  injections every 4 weeks  2.  Continue to treat reflux induced respiratory tract inflammation:   A. Pantoprazole  40 mg - 1 tablet 3-7 times per week  3.  If Needed: AIRSUPRA  - 2 inhalations every 6 hours  4.  Return to clinic in 6 months or earlier if needed.    5.  Influenza = Tamiflu. Covid = Paxlovid  6. Check nocturnal oximetry if sleep related respiratory symptoms  From a respiratory standpoint Angela [name] is doing very well in the context of her inflammatory respiratory condition and  her ALS.  Her spirometry utilized for the detection of obstruction is now unreliable as this effort dependent maneuver is going to be depressed as a result of her muscle weakness.  She is going to remain on anti-TSL P antibody and Breztri  and pantoprazole  to address her respiratory tract issues noted above.  I did have a discussion with her and her husband about checking for nocturnal deoxygenation should she develop any sleep-related respiratory symptoms.  They will let me know if that appears to be an issue and if so we will order that study.  Camellia Denis, MD Allergy / Immunology [facility]

## 2024-03-28 ENCOUNTER — Encounter: Payer: Self-pay | Admitting: Allergy and Immunology

## 2024-03-30 DIAGNOSIS — G1221 Amyotrophic lateral sclerosis: Secondary | ICD-10-CM | POA: Diagnosis not present

## 2024-04-06 DIAGNOSIS — K222 Esophageal obstruction: Secondary | ICD-10-CM | POA: Diagnosis not present

## 2024-04-06 DIAGNOSIS — Z7951 Long term (current) use of inhaled steroids: Secondary | ICD-10-CM | POA: Diagnosis not present

## 2024-04-06 DIAGNOSIS — Z87442 Personal history of urinary calculi: Secondary | ICD-10-CM | POA: Diagnosis not present

## 2024-04-06 DIAGNOSIS — K5909 Other constipation: Secondary | ICD-10-CM | POA: Diagnosis not present

## 2024-04-06 DIAGNOSIS — I10 Essential (primary) hypertension: Secondary | ICD-10-CM | POA: Diagnosis not present

## 2024-04-06 DIAGNOSIS — I351 Nonrheumatic aortic (valve) insufficiency: Secondary | ICD-10-CM | POA: Diagnosis not present

## 2024-04-06 DIAGNOSIS — E876 Hypokalemia: Secondary | ICD-10-CM | POA: Diagnosis not present

## 2024-04-06 DIAGNOSIS — Z8781 Personal history of (healed) traumatic fracture: Secondary | ICD-10-CM | POA: Diagnosis not present

## 2024-04-06 DIAGNOSIS — M810 Age-related osteoporosis without current pathological fracture: Secondary | ICD-10-CM | POA: Diagnosis not present

## 2024-04-06 DIAGNOSIS — E7800 Pure hypercholesterolemia, unspecified: Secondary | ICD-10-CM | POA: Diagnosis not present

## 2024-04-06 DIAGNOSIS — K449 Diaphragmatic hernia without obstruction or gangrene: Secondary | ICD-10-CM | POA: Diagnosis not present

## 2024-04-06 DIAGNOSIS — J4520 Mild intermittent asthma, uncomplicated: Secondary | ICD-10-CM | POA: Diagnosis not present

## 2024-04-06 DIAGNOSIS — E440 Moderate protein-calorie malnutrition: Secondary | ICD-10-CM | POA: Diagnosis not present

## 2024-04-06 DIAGNOSIS — R296 Repeated falls: Secondary | ICD-10-CM | POA: Diagnosis not present

## 2024-04-06 DIAGNOSIS — K219 Gastro-esophageal reflux disease without esophagitis: Secondary | ICD-10-CM | POA: Diagnosis not present

## 2024-04-06 DIAGNOSIS — G1221 Amyotrophic lateral sclerosis: Secondary | ICD-10-CM | POA: Diagnosis not present

## 2024-04-12 DIAGNOSIS — M810 Age-related osteoporosis without current pathological fracture: Secondary | ICD-10-CM | POA: Diagnosis not present

## 2024-04-12 DIAGNOSIS — E876 Hypokalemia: Secondary | ICD-10-CM | POA: Diagnosis not present

## 2024-04-12 DIAGNOSIS — I351 Nonrheumatic aortic (valve) insufficiency: Secondary | ICD-10-CM | POA: Diagnosis not present

## 2024-04-12 DIAGNOSIS — K5909 Other constipation: Secondary | ICD-10-CM | POA: Diagnosis not present

## 2024-04-12 DIAGNOSIS — E7800 Pure hypercholesterolemia, unspecified: Secondary | ICD-10-CM | POA: Diagnosis not present

## 2024-04-12 DIAGNOSIS — J4520 Mild intermittent asthma, uncomplicated: Secondary | ICD-10-CM | POA: Diagnosis not present

## 2024-04-12 DIAGNOSIS — Z8781 Personal history of (healed) traumatic fracture: Secondary | ICD-10-CM | POA: Diagnosis not present

## 2024-04-12 DIAGNOSIS — I10 Essential (primary) hypertension: Secondary | ICD-10-CM | POA: Diagnosis not present

## 2024-04-12 DIAGNOSIS — Z7951 Long term (current) use of inhaled steroids: Secondary | ICD-10-CM | POA: Diagnosis not present

## 2024-04-12 DIAGNOSIS — G1221 Amyotrophic lateral sclerosis: Secondary | ICD-10-CM | POA: Diagnosis not present

## 2024-04-12 DIAGNOSIS — Z87442 Personal history of urinary calculi: Secondary | ICD-10-CM | POA: Diagnosis not present

## 2024-04-12 DIAGNOSIS — K219 Gastro-esophageal reflux disease without esophagitis: Secondary | ICD-10-CM | POA: Diagnosis not present

## 2024-04-12 DIAGNOSIS — K222 Esophageal obstruction: Secondary | ICD-10-CM | POA: Diagnosis not present

## 2024-04-12 DIAGNOSIS — E440 Moderate protein-calorie malnutrition: Secondary | ICD-10-CM | POA: Diagnosis not present

## 2024-04-12 DIAGNOSIS — K449 Diaphragmatic hernia without obstruction or gangrene: Secondary | ICD-10-CM | POA: Diagnosis not present

## 2024-04-12 DIAGNOSIS — R296 Repeated falls: Secondary | ICD-10-CM | POA: Diagnosis not present

## 2024-04-15 DIAGNOSIS — I469 Cardiac arrest, cause unspecified: Secondary | ICD-10-CM | POA: Diagnosis not present

## 2024-04-15 DIAGNOSIS — R0602 Shortness of breath: Secondary | ICD-10-CM | POA: Diagnosis not present

## 2024-04-26 ENCOUNTER — Telehealth: Payer: Self-pay | Admitting: Allergy and Immunology

## 2024-04-26 NOTE — Telephone Encounter (Signed)
 As we know Clarita has passed away. Someone from Janet's office called and just wanted to see if we could cancel all of her Tezspire  deliveries for the future so that her husband would not get upset when they show up at the home.

## 2024-04-30 ENCOUNTER — Encounter: Payer: Self-pay | Admitting: Neurological Surgery

## 2024-04-30 NOTE — Telephone Encounter (Signed)
 Called [facility] and d/c rx

## 2024-09-25 ENCOUNTER — Ambulatory Visit: Admitting: Allergy and Immunology
